# Patient Record
Sex: Male | Born: 1965 | Race: White | Hispanic: No | Marital: Married | Smoking: Current every day smoker
Health system: Southern US, Community
[De-identification: ages and names within clinical notes are randomized; demographics above are authoritative.]

## PROBLEM LIST (undated history)

## (undated) DIAGNOSIS — I10 Essential (primary) hypertension: Secondary | ICD-10-CM

## (undated) DIAGNOSIS — D699 Hemorrhagic condition, unspecified: Secondary | ICD-10-CM

## (undated) DIAGNOSIS — R51 Headache: Secondary | ICD-10-CM

## (undated) DIAGNOSIS — M549 Dorsalgia, unspecified: Secondary | ICD-10-CM

## (undated) DIAGNOSIS — F32A Depression, unspecified: Secondary | ICD-10-CM

## (undated) DIAGNOSIS — F1910 Other psychoactive substance abuse, uncomplicated: Secondary | ICD-10-CM

## (undated) DIAGNOSIS — Z720 Tobacco use: Secondary | ICD-10-CM

## (undated) DIAGNOSIS — I219 Acute myocardial infarction, unspecified: Secondary | ICD-10-CM

## (undated) DIAGNOSIS — I2510 Atherosclerotic heart disease of native coronary artery without angina pectoris: Secondary | ICD-10-CM

## (undated) DIAGNOSIS — F418 Other specified anxiety disorders: Secondary | ICD-10-CM

## (undated) DIAGNOSIS — B1920 Unspecified viral hepatitis C without hepatic coma: Secondary | ICD-10-CM

## (undated) DIAGNOSIS — E785 Hyperlipidemia, unspecified: Secondary | ICD-10-CM

## (undated) DIAGNOSIS — M1990 Unspecified osteoarthritis, unspecified site: Secondary | ICD-10-CM

## (undated) DIAGNOSIS — J449 Chronic obstructive pulmonary disease, unspecified: Secondary | ICD-10-CM

## (undated) DIAGNOSIS — J189 Pneumonia, unspecified organism: Secondary | ICD-10-CM

## (undated) DIAGNOSIS — K219 Gastro-esophageal reflux disease without esophagitis: Secondary | ICD-10-CM

## (undated) DIAGNOSIS — B1910 Unspecified viral hepatitis B without hepatic coma: Secondary | ICD-10-CM

## (undated) DIAGNOSIS — G8929 Other chronic pain: Secondary | ICD-10-CM

## (undated) DIAGNOSIS — R519 Headache, unspecified: Secondary | ICD-10-CM

## (undated) DIAGNOSIS — F329 Major depressive disorder, single episode, unspecified: Secondary | ICD-10-CM

## (undated) HISTORY — PX: UPPER GI ENDOSCOPY: SHX6162

---

## 2002-08-23 ENCOUNTER — Emergency Department (HOSPITAL_COMMUNITY): Admission: EM | Admit: 2002-08-23 | Discharge: 2002-08-24 | Payer: Self-pay | Admitting: *Deleted

## 2008-03-06 HISTORY — PX: SHOULDER ARTHROSCOPY: SHX128

## 2010-03-06 HISTORY — PX: SHOULDER SURGERY: SHX246

## 2010-03-27 ENCOUNTER — Encounter: Payer: Self-pay | Admitting: Family Medicine

## 2011-03-07 HISTORY — PX: FOREIGN BODY REMOVAL: SHX962

## 2017-07-19 DIAGNOSIS — R4182 Altered mental status, unspecified: Secondary | ICD-10-CM

## 2017-07-19 DIAGNOSIS — T50901A Poisoning by unspecified drugs, medicaments and biological substances, accidental (unintentional), initial encounter: Secondary | ICD-10-CM | POA: Diagnosis not present

## 2017-07-19 DIAGNOSIS — N289 Disorder of kidney and ureter, unspecified: Secondary | ICD-10-CM | POA: Diagnosis not present

## 2017-07-19 DIAGNOSIS — M86112 Other acute osteomyelitis, left shoulder: Secondary | ICD-10-CM

## 2017-07-20 DIAGNOSIS — R4182 Altered mental status, unspecified: Secondary | ICD-10-CM | POA: Diagnosis not present

## 2017-07-20 DIAGNOSIS — T50901A Poisoning by unspecified drugs, medicaments and biological substances, accidental (unintentional), initial encounter: Secondary | ICD-10-CM | POA: Diagnosis not present

## 2017-07-20 DIAGNOSIS — M86112 Other acute osteomyelitis, left shoulder: Secondary | ICD-10-CM | POA: Diagnosis not present

## 2017-07-20 DIAGNOSIS — N289 Disorder of kidney and ureter, unspecified: Secondary | ICD-10-CM | POA: Diagnosis not present

## 2017-07-21 DIAGNOSIS — R4182 Altered mental status, unspecified: Secondary | ICD-10-CM | POA: Diagnosis not present

## 2017-07-21 DIAGNOSIS — N289 Disorder of kidney and ureter, unspecified: Secondary | ICD-10-CM | POA: Diagnosis not present

## 2017-07-21 DIAGNOSIS — T50901A Poisoning by unspecified drugs, medicaments and biological substances, accidental (unintentional), initial encounter: Secondary | ICD-10-CM | POA: Diagnosis not present

## 2017-07-21 DIAGNOSIS — M86112 Other acute osteomyelitis, left shoulder: Secondary | ICD-10-CM | POA: Diagnosis not present

## 2017-07-22 ENCOUNTER — Inpatient Hospital Stay (HOSPITAL_COMMUNITY)
Admission: AD | Admit: 2017-07-22 | Discharge: 2017-08-19 | DRG: 500 | Disposition: A | Discharge status: Home / Self Care | Payer: Medicare Other | Source: Other Acute Inpatient Hospital | Attending: Internal Medicine | Admitting: Internal Medicine

## 2017-07-22 ENCOUNTER — Encounter (HOSPITAL_COMMUNITY): Payer: Self-pay | Admitting: Internal Medicine

## 2017-07-22 DIAGNOSIS — Z7151 Drug abuse counseling and surveillance of drug abuser: Secondary | ICD-10-CM

## 2017-07-22 DIAGNOSIS — B182 Chronic viral hepatitis C: Secondary | ICD-10-CM | POA: Diagnosis not present

## 2017-07-22 DIAGNOSIS — K8020 Calculus of gallbladder without cholecystitis without obstruction: Secondary | ICD-10-CM | POA: Diagnosis not present

## 2017-07-22 DIAGNOSIS — T375X5A Adverse effect of antiviral drugs, initial encounter: Secondary | ICD-10-CM | POA: Diagnosis not present

## 2017-07-22 DIAGNOSIS — N170 Acute kidney failure with tubular necrosis: Secondary | ICD-10-CM | POA: Diagnosis not present

## 2017-07-22 DIAGNOSIS — F418 Other specified anxiety disorders: Secondary | ICD-10-CM

## 2017-07-22 DIAGNOSIS — F1390 Sedative, hypnotic, or anxiolytic use, unspecified, uncomplicated: Secondary | ICD-10-CM | POA: Diagnosis not present

## 2017-07-22 DIAGNOSIS — E785 Hyperlipidemia, unspecified: Secondary | ICD-10-CM

## 2017-07-22 DIAGNOSIS — K429 Umbilical hernia without obstruction or gangrene: Secondary | ICD-10-CM | POA: Diagnosis not present

## 2017-07-22 DIAGNOSIS — R7881 Bacteremia: Secondary | ICD-10-CM | POA: Diagnosis present

## 2017-07-22 DIAGNOSIS — F1910 Other psychoactive substance abuse, uncomplicated: Secondary | ICD-10-CM | POA: Diagnosis not present

## 2017-07-22 DIAGNOSIS — Z9114 Patient's other noncompliance with medication regimen: Secondary | ICD-10-CM

## 2017-07-22 DIAGNOSIS — D61818 Other pancytopenia: Secondary | ICD-10-CM | POA: Diagnosis present

## 2017-07-22 DIAGNOSIS — J449 Chronic obstructive pulmonary disease, unspecified: Secondary | ICD-10-CM | POA: Diagnosis present

## 2017-07-22 DIAGNOSIS — E611 Iron deficiency: Secondary | ICD-10-CM | POA: Diagnosis present

## 2017-07-22 DIAGNOSIS — N200 Calculus of kidney: Secondary | ICD-10-CM | POA: Diagnosis not present

## 2017-07-22 DIAGNOSIS — R768 Other specified abnormal immunological findings in serum: Secondary | ICD-10-CM | POA: Diagnosis not present

## 2017-07-22 DIAGNOSIS — Z6379 Other stressful life events affecting family and household: Secondary | ICD-10-CM | POA: Diagnosis not present

## 2017-07-22 DIAGNOSIS — Z885 Allergy status to narcotic agent status: Secondary | ICD-10-CM

## 2017-07-22 DIAGNOSIS — F1320 Sedative, hypnotic or anxiolytic dependence, uncomplicated: Secondary | ICD-10-CM | POA: Diagnosis present

## 2017-07-22 DIAGNOSIS — Z886 Allergy status to analgesic agent status: Secondary | ICD-10-CM

## 2017-07-22 DIAGNOSIS — R945 Abnormal results of liver function studies: Secondary | ICD-10-CM

## 2017-07-22 DIAGNOSIS — M868X1 Other osteomyelitis, shoulder: Secondary | ICD-10-CM | POA: Diagnosis present

## 2017-07-22 DIAGNOSIS — F329 Major depressive disorder, single episode, unspecified: Secondary | ICD-10-CM | POA: Diagnosis not present

## 2017-07-22 DIAGNOSIS — R7989 Other specified abnormal findings of blood chemistry: Secondary | ICD-10-CM | POA: Diagnosis present

## 2017-07-22 DIAGNOSIS — G9341 Metabolic encephalopathy: Secondary | ICD-10-CM | POA: Diagnosis not present

## 2017-07-22 DIAGNOSIS — K7200 Acute and subacute hepatic failure without coma: Secondary | ICD-10-CM | POA: Diagnosis not present

## 2017-07-22 DIAGNOSIS — R45850 Homicidal ideations: Secondary | ICD-10-CM | POA: Diagnosis present

## 2017-07-22 DIAGNOSIS — I2583 Coronary atherosclerosis due to lipid rich plaque: Secondary | ICD-10-CM | POA: Diagnosis not present

## 2017-07-22 DIAGNOSIS — M86112 Other acute osteomyelitis, left shoulder: Secondary | ICD-10-CM | POA: Diagnosis not present

## 2017-07-22 DIAGNOSIS — I10 Essential (primary) hypertension: Secondary | ICD-10-CM | POA: Diagnosis not present

## 2017-07-22 DIAGNOSIS — T50901A Poisoning by unspecified drugs, medicaments and biological substances, accidental (unintentional), initial encounter: Secondary | ICD-10-CM | POA: Diagnosis not present

## 2017-07-22 DIAGNOSIS — E872 Acidosis: Secondary | ICD-10-CM | POA: Diagnosis present

## 2017-07-22 DIAGNOSIS — Y636 Underdosing and nonadministration of necessary drug, medicament or biological substance: Secondary | ICD-10-CM | POA: Diagnosis not present

## 2017-07-22 DIAGNOSIS — M869 Osteomyelitis, unspecified: Secondary | ICD-10-CM | POA: Diagnosis not present

## 2017-07-22 DIAGNOSIS — M00012 Staphylococcal arthritis, left shoulder: Secondary | ICD-10-CM | POA: Diagnosis not present

## 2017-07-22 DIAGNOSIS — Z720 Tobacco use: Secondary | ICD-10-CM | POA: Diagnosis not present

## 2017-07-22 DIAGNOSIS — Z8349 Family history of other endocrine, nutritional and metabolic diseases: Secondary | ICD-10-CM

## 2017-07-22 DIAGNOSIS — A419 Sepsis, unspecified organism: Secondary | ICD-10-CM | POA: Diagnosis not present

## 2017-07-22 DIAGNOSIS — R0789 Other chest pain: Secondary | ICD-10-CM | POA: Diagnosis not present

## 2017-07-22 DIAGNOSIS — K766 Portal hypertension: Secondary | ICD-10-CM | POA: Diagnosis present

## 2017-07-22 DIAGNOSIS — M609 Myositis, unspecified: Secondary | ICD-10-CM | POA: Diagnosis not present

## 2017-07-22 DIAGNOSIS — B9562 Methicillin resistant Staphylococcus aureus infection as the cause of diseases classified elsewhere: Secondary | ICD-10-CM

## 2017-07-22 DIAGNOSIS — K219 Gastro-esophageal reflux disease without esophagitis: Secondary | ICD-10-CM | POA: Diagnosis present

## 2017-07-22 DIAGNOSIS — N183 Chronic kidney disease, stage 3 (moderate): Secondary | ICD-10-CM | POA: Diagnosis present

## 2017-07-22 DIAGNOSIS — K7290 Hepatic failure, unspecified without coma: Secondary | ICD-10-CM | POA: Diagnosis not present

## 2017-07-22 DIAGNOSIS — M009 Pyogenic arthritis, unspecified: Secondary | ICD-10-CM | POA: Diagnosis present

## 2017-07-22 DIAGNOSIS — B181 Chronic viral hepatitis B without delta-agent: Secondary | ICD-10-CM | POA: Diagnosis present

## 2017-07-22 DIAGNOSIS — I361 Nonrheumatic tricuspid (valve) insufficiency: Secondary | ICD-10-CM | POA: Diagnosis not present

## 2017-07-22 DIAGNOSIS — N189 Chronic kidney disease, unspecified: Secondary | ICD-10-CM | POA: Diagnosis not present

## 2017-07-22 DIAGNOSIS — Z811 Family history of alcohol abuse and dependence: Secondary | ICD-10-CM | POA: Diagnosis not present

## 2017-07-22 DIAGNOSIS — F1590 Other stimulant use, unspecified, uncomplicated: Secondary | ICD-10-CM | POA: Diagnosis not present

## 2017-07-22 DIAGNOSIS — D539 Nutritional anemia, unspecified: Secondary | ICD-10-CM | POA: Diagnosis present

## 2017-07-22 DIAGNOSIS — F17210 Nicotine dependence, cigarettes, uncomplicated: Secondary | ICD-10-CM | POA: Diagnosis present

## 2017-07-22 DIAGNOSIS — I358 Other nonrheumatic aortic valve disorders: Secondary | ICD-10-CM | POA: Diagnosis present

## 2017-07-22 DIAGNOSIS — B180 Chronic viral hepatitis B with delta-agent: Secondary | ICD-10-CM | POA: Diagnosis not present

## 2017-07-22 DIAGNOSIS — I129 Hypertensive chronic kidney disease with stage 1 through stage 4 chronic kidney disease, or unspecified chronic kidney disease: Secondary | ICD-10-CM | POA: Diagnosis present

## 2017-07-22 DIAGNOSIS — K7460 Unspecified cirrhosis of liver: Secondary | ICD-10-CM | POA: Diagnosis present

## 2017-07-22 DIAGNOSIS — N179 Acute kidney failure, unspecified: Secondary | ICD-10-CM | POA: Diagnosis not present

## 2017-07-22 DIAGNOSIS — M25512 Pain in left shoulder: Secondary | ICD-10-CM

## 2017-07-22 DIAGNOSIS — R4182 Altered mental status, unspecified: Secondary | ICD-10-CM | POA: Diagnosis not present

## 2017-07-22 DIAGNOSIS — D638 Anemia in other chronic diseases classified elsewhere: Secondary | ICD-10-CM | POA: Diagnosis present

## 2017-07-22 DIAGNOSIS — K7030 Alcoholic cirrhosis of liver without ascites: Secondary | ICD-10-CM | POA: Diagnosis not present

## 2017-07-22 DIAGNOSIS — I2510 Atherosclerotic heart disease of native coronary artery without angina pectoris: Secondary | ICD-10-CM | POA: Diagnosis present

## 2017-07-22 DIAGNOSIS — R450 Nervousness: Secondary | ICD-10-CM | POA: Diagnosis not present

## 2017-07-22 DIAGNOSIS — N289 Disorder of kidney and ureter, unspecified: Secondary | ICD-10-CM | POA: Diagnosis not present

## 2017-07-22 DIAGNOSIS — Z635 Disruption of family by separation and divorce: Secondary | ICD-10-CM | POA: Diagnosis not present

## 2017-07-22 DIAGNOSIS — F419 Anxiety disorder, unspecified: Secondary | ICD-10-CM | POA: Diagnosis not present

## 2017-07-22 DIAGNOSIS — A4902 Methicillin resistant Staphylococcus aureus infection, unspecified site: Secondary | ICD-10-CM | POA: Diagnosis present

## 2017-07-22 DIAGNOSIS — M25519 Pain in unspecified shoulder: Secondary | ICD-10-CM

## 2017-07-22 DIAGNOSIS — E876 Hypokalemia: Secondary | ICD-10-CM | POA: Diagnosis not present

## 2017-07-22 HISTORY — DX: Hyperlipidemia, unspecified: E78.5

## 2017-07-22 HISTORY — DX: Tobacco use: Z72.0

## 2017-07-22 HISTORY — DX: Headache, unspecified: R51.9

## 2017-07-22 HISTORY — DX: Other chronic pain: G89.29

## 2017-07-22 HISTORY — DX: Hemorrhagic condition, unspecified: D69.9

## 2017-07-22 HISTORY — DX: Essential (primary) hypertension: I10

## 2017-07-22 HISTORY — DX: Pneumonia, unspecified organism: J18.9

## 2017-07-22 HISTORY — DX: Unspecified osteoarthritis, unspecified site: M19.90

## 2017-07-22 HISTORY — DX: Depression, unspecified: F32.A

## 2017-07-22 HISTORY — DX: Unspecified viral hepatitis B without hepatic coma: B19.10

## 2017-07-22 HISTORY — DX: Other psychoactive substance abuse, uncomplicated: F19.10

## 2017-07-22 HISTORY — DX: Other specified anxiety disorders: F41.8

## 2017-07-22 HISTORY — DX: Atherosclerotic heart disease of native coronary artery without angina pectoris: I25.10

## 2017-07-22 HISTORY — DX: Major depressive disorder, single episode, unspecified: F32.9

## 2017-07-22 HISTORY — DX: Unspecified viral hepatitis C without hepatic coma: B19.20

## 2017-07-22 HISTORY — DX: Gastro-esophageal reflux disease without esophagitis: K21.9

## 2017-07-22 HISTORY — DX: Chronic obstructive pulmonary disease, unspecified: J44.9

## 2017-07-22 HISTORY — DX: Acute myocardial infarction, unspecified: I21.9

## 2017-07-22 HISTORY — DX: Dorsalgia, unspecified: M54.9

## 2017-07-22 HISTORY — DX: Headache: R51

## 2017-07-22 NOTE — H&P (Addendum)
History and Physical    Glenn Alvarado WPY:[id number] DOB: [birth year] DOA: 07/22/2017  Referring MD/NP/PA:   PCP: Patient, No Pcp Per   Patient coming from:  The patient is coming from home.  At baseline, pt is independent for most of ADL.   Chief Complaint: left shoulder pain  HPI: Glenn Alvarado is a 52 y.o. male with medical history significant of hypertension, hyperlipidemia COPD, GERD, depression with anxiety, CAD, substance abuse, tobacco abuse, who presents with left shoulder pain.  Patient is transfered from [facility].  Pt was initially admitted to [facility] on 07/19/2017 due to altered mental status.  That was most likely due to drug abuse.  Patient had negative CT head.  And found to have possibly UDS for amphetamine and benzodiazepines.  Patient's altered mental status improved quickly.  After he woke up, patient complains of left shoulder pain.  He also had fver and leukocytosis. CT scan finding is concerning for osteomyelitis of the glenohumeral joint. Orthopedics was consulted there, recommending transfer to tertiary care center with ID, plastics as well as trauma available for wound washout.   Lab tests showed leukocytosis, elevated creatinine, anion gap metabolic acidosis, elevated T bili AST, worsening anemia from baseline. Patient also had 1/4 blood cultures positive for MRSA admission. Pt had MRI of left shoulder done, but no report was sent here with pt.  When I saw pt on the floor, he has severe pain left shoulder.  The pain is constant, severe, 10 out of 10 severity, radiating to left side of chest.  Patient has significant limitation of range of motion of left shoulder.  He also has left shoulder effusion and warmth.  Patient denies cough, nausea, vomiting, diarrhea, abdominal pain, symptoms of UTI.  No unilateral weakness.  Currently no fever or chills.  Patient states that she is not taking any prescribed medications at home.   Echocardiogram from [facility]  hospital: 1. There is normal global left ventricular contractility.  2. Overall left ventricular systolic function is normal with, an EF between 65 - 70 %.  3. The right ventricular systolic function is normal.  4. There is mild aortic valve sclerosis.  5. Mild tricuspid regurgitation present.   Review of Systems:   General: currently no fevers, chills, no body weight gain, has fatigue HEENT: no blurry vision, hearing changes or sore throat Respiratory: no dyspnea, coughing, wheezing CV: no chest pain, no palpitations GI: no nausea, vomiting, abdominal pain, diarrhea, constipation GU: no dysuria, burning on urination, increased urinary frequency, hematuria  Ext: no leg edema Neuro: no unilateral weakness, numbness, or tingling, no vision change or hearing loss Skin: no rash, no skin tear. MSK: has left shoulder pain, swelling and limitation of range of motion. Heme: No easy bruising.  Travel history: No recent long distant travel.  Allergy: Allergies not on file  Past Medical History:  Diagnosis Date  . CAD (coronary artery disease)   . Depression with anxiety   . Essential hypertension   . GERD (gastroesophageal reflux disease)   . HLD (hyperlipidemia)   . Substance abuse ([place])   . Tobacco abuse     Past Surgical History:  Procedure Laterality Date  . SHOULDER SURGERY Right     Social History:  reports that he has been smoking.  He has never used smokeless tobacco. He reports that he drank alcohol. He reports that he has current or past drug history. Drug: Amphetamines.  Family History:  Family History  Problem Relation Age of Onset  .  Hyperlipidemia Father   . Alcoholism Father      Prior to Admission medications   Not on File    Physical Exam: Vitals:   07/23/17 0118 07/23/17 0200 07/23/17 0426  BP: (!) 143/79  131/78  Pulse: 63  70  Resp: 20  20  Temp: 98.5 F (36.9 C)  98.7 F (37.1 C)  TempSrc: Oral  Oral  SpO2: 100%  100%  Weight:  77.3 kg (170  lb 6.7 oz)   Height:  5' 8" (1.727 m)    General: Not in acute distress HEENT:       Eyes: PERRL, EOMI, no scleral icterus.       ENT: No discharge from the ears and nose, no pharynx injection, no tonsillar enlargement.        Neck: No JVD, no bruit, no mass felt. Heme: No neck lymph node enlargement. Cardiac: S1/S2, RRR, No murmurs, No gallops or rubs. Respiratory: No rales, wheezing, rhonchi or rubs. GI: Soft, nondistended, nontender, no rebound pain, no organomegaly, BS present. GU: No hematuria Ext: No pitting leg edema bilaterally. 2+DP/PT pulse bilaterally. Musculoskeletal: has left shoulder pain, swelling, warmth, and limitation of range of motion. Skin: No rashes.  Neuro: Alert, oriented X3, cranial nerves II-XII grossly intact, moves all extremities. Psych: Patient is not psychotic, no suicidal or hemocidal ideation.  Labs on Admission: I have personally reviewed following labs and imaging studies  CBC: Recent Labs  Lab 07/23/17 0419  WBC 3.7*  NEUTROABS 2.2  HGB 7.9*  HCT 24.3*  MCV 98.0  PLT 629*   Basic Metabolic Panel: Recent Labs  Lab 07/23/17 0419  NA 138  K 4.1  CL 113*  CO2 16*  GLUCOSE 104*  BUN 31*  CREATININE 2.88*  CALCIUM 7.8*   GFR: Estimated Creatinine Clearance: 29.7 mL/min (A) (by C-G formula based on SCr of 2.88 mg/dL (H)). Liver Function Tests: Recent Labs  Lab 07/23/17 0419  AST 87*  ALT 43  ALKPHOS 80  BILITOT 0.9  PROT 7.7  ALBUMIN 2.2*   No results for input(s): LIPASE, AMYLASE in the last 168 hours. No results for input(s): AMMONIA in the last 168 hours. Coagulation Profile: Recent Labs  Lab 07/23/17 0419  INR 1.45   Cardiac Enzymes: No results for input(s): CKTOTAL, CKMB, CKMBINDEX, TROPONINI in the last 168 hours. BNP (last 3 results) No results for input(s): PROBNP in the last 8760 hours. HbA1C: No results for input(s): HGBA1C in the last 72 hours. CBG: No results for input(s): GLUCAP in the last 168  hours. Lipid Profile: Recent Labs    07/23/17 0419  CHOL 95  HDL 17*  LDLCALC 65  TRIG 64  CHOLHDL 5.6   Thyroid Function Tests: No results for input(s): TSH, T4TOTAL, FREET4, T3FREE, THYROIDAB in the last 72 hours. Anemia Panel: No results for input(s): VITAMINB12, FOLATE, FERRITIN, TIBC, IRON, RETICCTPCT in the last 72 hours. Urine analysis: No results found for: COLORURINE, APPEARANCEUR, LABSPEC, PHURINE, GLUCOSEU, HGBUR, BILIRUBINUR, KETONESUR, PROTEINUR, UROBILINOGEN, NITRITE, LEUKOCYTESUR Sepsis Labs: _0 (procalcitonin:4,lacticidven:4) )No results found for this or any previous visit (from the past 240 hour(s)).   Radiological Exams on Admission: No results found.   EKG: Independently reviewed.  Not done in ED, will get one.   Assessment/Plan Principal Problem:   Osteomyelitis of left shoulder ([name]) Active Problems:   CAD (coronary artery disease)   Depression with anxiety   Essential hypertension   GERD (gastroesophageal reflux disease)   HLD (hyperlipidemia)   Substance abuse ([place])  AKI (acute kidney injury) ([place])   Pancytopenia ([place])   Acute metabolic encephalopathy   Septic joint of left shoulder region ([name])   Abnormal LFTs   Tobacco abuse   COPD (chronic obstructive pulmonary disease) ([name])   Principal Problem:   Osteomyelitis of left shoulder ([name]) Active Problems:   CAD (coronary artery disease)   Depression with anxiety   Essential hypertension   GERD (gastroesophageal reflux disease)   HLD (hyperlipidemia)   Substance abuse ([place])   AKI (acute kidney injury) ([place])   Pancytopenia ([place])   Acute metabolic encephalopathy   Septic joint of left shoulder region ([name])   Abnormal LFTs   Tobacco abuse   COPD (chronic obstructive pulmonary disease) ([name])    Assessment and plan:   Left shoulder pain, imaging concerning for osteomyelitis of the glenohumeral joint: at arrival, patient does not have fever or chills.  His WBC was 3.7 today.  Ortho, Dr. Percell Miller was consulted. Pt had MRI in the hospital, will need to request report in the morning. Patient had 1/4 blood cultures positive for MRSA admission.  - will admit to med-surg bed  - Empiric antimicrobial treatment with vancomycin and Zosyn per pharmacy - PRN Zofran for nausea, oxycodone for pain - Blood cultures x 2  - ESR and CRP - will get Procalcitonin and trend lactic acid levels per sepsis protocol. - IVF: 125 cc/h - CBC and CMP -Patient develops fever, will be difficult to choose medications.- patient has acute renal injury, cannot use ibuprofen; his abnormal liver function making Tylenol not good candidate.  May carefully use Tylenol 325 mg prn  Acute metabolic encephalopathy: resolved. CT head negative. Now  mental status normal.  Most likely due to substance abuse -no new issue  AKI: Most recent labs in March show baseline creatinine 0.9. His Cre 3.3--> down to 2.7--> but up trending to 2.9 today. CT scan showed bilateral nonobstructive nephrolithiasis. No hydronephrosis or renal obstruction is noted.  -continue IVF, NS 125cc/h -f/u by BMP   Abnormal liver function: ALP 116, AST was 67, ALT 60, total bilirubin 0.5, concern for recent alcohol use/abuse; questionable history of hepatitis. Alcohol negative per UDS, patient denies any recent alcohol use  -Hepatitis panel, HIV antibody  Nephrolithiasis, incidentally found  -continue IV hydration as above, nonobstructive  -follow for passage/pain, if necessary will consult Urology/follow up outpatient   Pancytopenia: Hemoglobin 8.1, WBC 3.7, platelets 90 today. -anemia penal  CAD (coronary artery disease): no CP. Not taking any meds -check FLP and A1c  Depression with anxiety: not taking meds. No SI or HI now -prn Xanax  Essential hypertension: -IV hydralazine during  GERD: -Protonix prn  HLD (hyperlipidemia): not taking meds -check FLP  Substance abuse ([name]) and tobacco abuse -did counseling about the  importance of quitting substance use -nicotine patch  COPD: stable -PRN albuterol nebulizers and Mucinex    DVT ppx: SCd Code Status: Full code Family Communication: None at bed side.  Disposition Plan:  Anticipate discharge back to previous home environment Consults called:  Dr. Percell Miller Admission status:  medical floor/inpt   Date of Service 07/23/2017    Ivor Costa [facility] Pager [phone]  If 7PM-7AM, please contact night-coverage [email or web address] Password [email or web address] 07/23/2017, 6:10 AM

## 2017-07-22 NOTE — Progress Notes (Signed)
Text paged [facility] to notify MD of patient's arrival to floor.

## 2017-07-23 ENCOUNTER — Encounter (HOSPITAL_COMMUNITY): Payer: Self-pay | Admitting: Internal Medicine

## 2017-07-23 ENCOUNTER — Inpatient Hospital Stay (HOSPITAL_COMMUNITY): Payer: Medicare Other

## 2017-07-23 DIAGNOSIS — Z720 Tobacco use: Secondary | ICD-10-CM | POA: Diagnosis present

## 2017-07-23 DIAGNOSIS — B180 Chronic viral hepatitis B with delta-agent: Secondary | ICD-10-CM

## 2017-07-23 DIAGNOSIS — J449 Chronic obstructive pulmonary disease, unspecified: Secondary | ICD-10-CM | POA: Diagnosis present

## 2017-07-23 LAB — CBC WITH DIFFERENTIAL/PLATELET
ABS IMMATURE GRANULOCYTES: 0 10*3/uL (ref 0.0–0.1)
BASOS ABS: 0 10*3/uL (ref 0.0–0.1)
Basophils Relative: 1 %
EOS PCT: 6 %
Eosinophils Absolute: 0.2 10*3/uL (ref 0.0–0.7)
HEMATOCRIT: 24.3 % — AB (ref 39.0–52.0)
HEMOGLOBIN: 7.9 g/dL — AB (ref 13.0–17.0)
Immature Granulocytes: 0 %
LYMPHS ABS: 0.9 10*3/uL (ref 0.7–4.0)
LYMPHS PCT: 24 %
MCH: 31.9 pg (ref 26.0–34.0)
MCHC: 32.5 g/dL (ref 30.0–36.0)
MCV: 98 fL (ref 78.0–100.0)
MONO ABS: 0.4 10*3/uL (ref 0.1–1.0)
Monocytes Relative: 11 %
NEUTROS ABS: 2.2 10*3/uL (ref 1.7–7.7)
Neutrophils Relative %: 58 %
Platelets: 100 10*3/uL — ABNORMAL LOW (ref 150–400)
RBC: 2.48 MIL/uL — AB (ref 4.22–5.81)
RDW: 15.6 % — ABNORMAL HIGH (ref 11.5–15.5)
WBC: 3.7 10*3/uL — ABNORMAL LOW (ref 4.0–10.5)

## 2017-07-23 LAB — COMPREHENSIVE METABOLIC PANEL
ALBUMIN: 2.2 g/dL — AB (ref 3.5–5.0)
ALK PHOS: 80 U/L (ref 38–126)
ALT: 43 U/L (ref 17–63)
AST: 87 U/L — ABNORMAL HIGH (ref 15–41)
Anion gap: 9 (ref 5–15)
BILIRUBIN TOTAL: 0.9 mg/dL (ref 0.3–1.2)
BUN: 31 mg/dL — AB (ref 6–20)
CALCIUM: 7.8 mg/dL — AB (ref 8.9–10.3)
CO2: 16 mmol/L — AB (ref 22–32)
CREATININE: 2.88 mg/dL — AB (ref 0.61–1.24)
Chloride: 113 mmol/L — ABNORMAL HIGH (ref 101–111)
GFR calc Af Amer: 28 mL/min — ABNORMAL LOW (ref >60)
GFR calc non Af Amer: 24 mL/min — ABNORMAL LOW (ref >60)
GLUCOSE: 104 mg/dL — AB (ref 65–99)
Potassium: 4.1 mmol/L (ref 3.5–5.1)
SODIUM: 138 mmol/L (ref 135–145)
TOTAL PROTEIN: 7.7 g/dL (ref 6.5–8.1)

## 2017-07-23 LAB — HEMOGLOBIN A1C
Hgb A1c MFr Bld: 4.4 % — ABNORMAL LOW (ref 4.8–5.6)
Mean Plasma Glucose: 79.58 mg/dL

## 2017-07-23 LAB — SEDIMENTATION RATE: Sed Rate: 99 mm/hr — ABNORMAL HIGH (ref 0–16)

## 2017-07-23 LAB — TYPE AND SCREEN
ABO/RH(D): A POS
ANTIBODY SCREEN: NEGATIVE

## 2017-07-23 LAB — VITAMIN B12: Vitamin B-12: 938 pg/mL — ABNORMAL HIGH (ref 180–914)

## 2017-07-23 LAB — APTT: aPTT: 45 seconds — ABNORMAL HIGH (ref 24–36)

## 2017-07-23 LAB — GLUCOSE, CAPILLARY: Glucose-Capillary: 88 mg/dL (ref 65–99)

## 2017-07-23 LAB — LIPID PANEL
CHOLESTEROL: 95 mg/dL (ref 0–200)
HDL: 17 mg/dL — AB (ref >40)
LDL Cholesterol: 65 mg/dL (ref 0–99)
TRIGLYCERIDES: 64 mg/dL (ref <150)
Total CHOL/HDL Ratio: 5.6 RATIO
VLDL: 13 mg/dL (ref 0–40)

## 2017-07-23 LAB — HIV ANTIBODY (ROUTINE TESTING W REFLEX): HIV Screen 4th Generation wRfx: NONREACTIVE

## 2017-07-23 LAB — RETICULOCYTES
RBC.: 2.4 MIL/uL — ABNORMAL LOW (ref 4.22–5.81)
RETIC COUNT ABSOLUTE: 31.2 10*3/uL (ref 19.0–186.0)
Retic Ct Pct: 1.3 % (ref 0.4–3.1)

## 2017-07-23 LAB — SYNOVIAL FLUID, CRYSTAL: Crystals, Fluid: NONE SEEN

## 2017-07-23 LAB — ABO/RH: ABO/RH(D): A POS

## 2017-07-23 LAB — IRON AND TIBC
IRON: 34 ug/dL — AB (ref 45–182)
Saturation Ratios: 11 % — ABNORMAL LOW (ref 17.9–39.5)
TIBC: 316 ug/dL (ref 250–450)
UIBC: 282 ug/dL

## 2017-07-23 LAB — PROTIME-INR
INR: 1.45
PROTHROMBIN TIME: 17.5 s — AB (ref 11.4–15.2)

## 2017-07-23 LAB — C-REACTIVE PROTEIN: CRP: 1.6 mg/dL — ABNORMAL HIGH (ref <1.0)

## 2017-07-23 LAB — FOLATE: Folate: 22.2 ng/mL (ref >5.9)

## 2017-07-23 LAB — FERRITIN: FERRITIN: 82 ng/mL (ref 24–336)

## 2017-07-23 MED ORDER — SODIUM CHLORIDE 0.9 % IJ SOLN: 10.0000 mL | Freq: Once | INTRAMUSCULAR | Status: DC

## 2017-07-23 MED ORDER — ONDANSETRON HCL 4 MG/2ML IJ SOLN
4.0000 mg | Freq: Three times a day (TID) | INTRAMUSCULAR | Status: DC | PRN
  Administered 2017-07-26 (×2): 4 mg via INTRAVENOUS
  Filled 2017-07-23 (×2): qty 2

## 2017-07-23 MED ORDER — MORPHINE SULFATE (PF) 4 MG/ML IV SOLN
4.0000 mg | INTRAVENOUS | Status: AC | PRN
  Administered 2017-07-23 (×2): 4 mg via INTRAVENOUS
  Filled 2017-07-23 (×2): qty 1

## 2017-07-23 MED ORDER — POLYETHYLENE GLYCOL 3350 17 G PO PACK: 17.0000 g | PACK | Freq: Every day | ORAL | Status: DC | PRN

## 2017-07-23 MED ORDER — CHLORHEXIDINE GLUCONATE 0.12 % MT SOLN
15.0000 mL | Freq: Two times a day (BID) | OROMUCOSAL | Status: DC
  Administered 2017-07-23: 15 mL via OROMUCOSAL
  Filled 2017-07-23: qty 15

## 2017-07-23 MED ORDER — SODIUM CHLORIDE 0.9 % IV SOLN
INTRAVENOUS | Status: DC
  Administered 2017-07-23 – 2017-07-25 (×4): via INTRAVENOUS

## 2017-07-23 MED ORDER — PIPERACILLIN-TAZOBACTAM 3.375 G IVPB
3.3750 g | Freq: Three times a day (TID) | INTRAVENOUS | Status: DC
  Filled 2017-07-23 (×2): qty 50

## 2017-07-23 MED ORDER — ZOLPIDEM TARTRATE 5 MG PO TABS
5.0000 mg | ORAL_TABLET | Freq: Every evening | ORAL | Status: DC | PRN
  Administered 2017-07-23 – 2017-07-24 (×2): 5 mg via ORAL
  Filled 2017-07-23 (×2): qty 1

## 2017-07-23 MED ORDER — OXYCODONE HCL 5 MG PO TABS
5.0000 mg | ORAL_TABLET | ORAL | Status: DC | PRN
  Administered 2017-07-23 – 2017-07-29 (×28): 5 mg via ORAL
  Filled 2017-07-23 (×29): qty 1

## 2017-07-23 MED ORDER — ACETAMINOPHEN 325 MG PO TABS
325.0000 mg | ORAL_TABLET | Freq: Four times a day (QID) | ORAL | Status: DC | PRN
  Administered 2017-07-26 – 2017-08-15 (×4): 325 mg via ORAL
  Filled 2017-07-23 (×5): qty 1

## 2017-07-23 MED ORDER — LIDOCAINE HCL (PF) 1 % IJ SOLN
INTRAMUSCULAR | Status: AC
  Administered 2017-07-23: 4 mL via INTRADERMAL
  Filled 2017-07-23: qty 5

## 2017-07-23 MED ORDER — ALBUTEROL SULFATE (2.5 MG/3ML) 0.083% IN NEBU: 2.5000 mg | INHALATION_SOLUTION | RESPIRATORY_TRACT | Status: DC | PRN

## 2017-07-23 MED ORDER — ENSURE ENLIVE PO LIQD
237.0000 mL | Freq: Two times a day (BID) | ORAL | Status: DC
  Administered 2017-07-24 – 2017-08-18 (×46): 237 mL via ORAL

## 2017-07-23 MED ORDER — ALPRAZOLAM 0.5 MG PO TABS
0.5000 mg | ORAL_TABLET | Freq: Three times a day (TID) | ORAL | Status: DC | PRN
  Administered 2017-07-23 – 2017-07-25 (×7): 0.5 mg via ORAL
  Filled 2017-07-23 (×7): qty 1

## 2017-07-23 MED ORDER — NICOTINE 21 MG/24HR TD PT24
21.0000 mg | MEDICATED_PATCH | Freq: Every day | TRANSDERMAL | Status: DC
  Administered 2017-07-23 – 2017-08-19 (×27): 21 mg via TRANSDERMAL
  Filled 2017-07-23 (×27): qty 1

## 2017-07-23 MED ORDER — TENOFOVIR DISOPROXIL FUMARATE 300 MG PO TABS
300.0000 mg | ORAL_TABLET | ORAL | Status: DC
  Administered 2017-07-23 – 2017-07-29 (×3): 300 mg via ORAL
  Filled 2017-07-23 (×3): qty 1

## 2017-07-23 MED ORDER — SODIUM CHLORIDE 0.9 % IJ SOLN
INTRAMUSCULAR | Status: AC
  Filled 2017-07-23: qty 10

## 2017-07-23 MED ORDER — LIDOCAINE HCL (PF) 1 % IJ SOLN: 5.0000 mL | Freq: Once | INTRAMUSCULAR | Status: DC

## 2017-07-23 MED ORDER — PIPERACILLIN-TAZOBACTAM 3.375 G IVPB 30 MIN
3.3750 g | Freq: Once | INTRAVENOUS | Status: AC
  Administered 2017-07-23: 3.375 g via INTRAVENOUS
  Filled 2017-07-23 (×3): qty 50

## 2017-07-23 MED ORDER — HYDRALAZINE HCL 20 MG/ML IJ SOLN: 5.0000 mg | INTRAMUSCULAR | Status: DC | PRN

## 2017-07-23 MED ORDER — VANCOMYCIN HCL IN DEXTROSE 1-5 GM/200ML-% IV SOLN
1000.0000 mg | Freq: Once | INTRAVENOUS | Status: AC
  Administered 2017-07-23: 1000 mg via INTRAVENOUS
  Filled 2017-07-23 (×2): qty 200

## 2017-07-23 MED ORDER — ORAL CARE MOUTH RINSE
15.0000 mL | Freq: Two times a day (BID) | OROMUCOSAL | Status: DC
  Administered 2017-07-23 (×2): 15 mL via OROMUCOSAL

## 2017-07-23 MED ORDER — VANCOMYCIN HCL IN DEXTROSE 750-5 MG/150ML-% IV SOLN
750.0000 mg | INTRAVENOUS | Status: DC
  Administered 2017-07-24: 750 mg via INTRAVENOUS
  Filled 2017-07-23: qty 150

## 2017-07-23 MED ORDER — PANTOPRAZOLE SODIUM 40 MG PO TBEC: 40.0000 mg | DELAYED_RELEASE_TABLET | Freq: Every day | ORAL | Status: DC | PRN

## 2017-07-23 MED ORDER — LIDOCAINE HCL (PF) 1 % IJ SOLN
5.0000 mL | Freq: Once | INTRAMUSCULAR | Status: AC
  Administered 2017-07-23: 4 mL via INTRADERMAL

## 2017-07-23 MED ORDER — IOPAMIDOL (ISOVUE-M 200) INJECTION 41%
20.0000 mL | Freq: Once | INTRAMUSCULAR | Status: AC
  Administered 2017-07-23: 8 mL via INTRA_ARTICULAR

## 2017-07-23 MED ORDER — ACETAMINOPHEN 325 MG PO TABS: 650.0000 mg | ORAL_TABLET | Freq: Four times a day (QID) | ORAL | Status: DC | PRN

## 2017-07-23 MED ORDER — IOPAMIDOL (ISOVUE-M 200) INJECTION 41%
INTRAMUSCULAR | Status: AC
  Administered 2017-07-23: 8 mL via INTRA_ARTICULAR
  Filled 2017-07-23: qty 10

## 2017-07-23 MED ORDER — DM-GUAIFENESIN ER 30-600 MG PO TB12: 1.0000 | ORAL_TABLET | Freq: Two times a day (BID) | ORAL | Status: DC | PRN

## 2017-07-23 NOTE — Consult Note (Signed)
[facility] for Infectious Disease    Date of Admission:  07/22/2017   Total days of antibiotics 1         Day 1 Vancomycin               Reason for Consult: MRSA bacteremia, ?septic left shoulder     Referring Provider: Dr. Cruzita Lederer   Assessment: Glenn Alvarado is a 52 y.o. male with history of cirrhosis, Chronic [name], CAD, HTN, COPD, Depression/Anxiety, substance use admitted to [facility] with encephalopathy. Found to be bacteremic with 1/4 bottles (+) MRSA. TTE at [facility] was negative. CT scan of the shoulder concerning for osteomyelitis/septic arthritis of the glenoid humeral joint. Orthopedics has seen the patient and planning CT-guided aspiration. He has received antibiotics at [facility] and is now on Vancomycin since admission. CRP slightly elevated at 1.6 with ESR 99.   He is on entecavir at home 0.5 mg QD per chart review at [facility]. He has had a lapse filling his medications from his report. Will check [name] DNA with next lab draw. Our pharmacy does not carry this here nor the preferred Vemlidy. Will start Vired 300 mg Q72h - will follow renal function to adjust (>83m/min increase to Q48).   Plan: 1. Await CT guided aspirate of shoulder 2. Continue Vancomycin  3. Start Vired 300 mg Q72h and watch kidney function to adjust.  4. [name] DNA tomorrow morning 5. Repeat blood cultures pending 6. Watch kidney function on vancomycin - not sure where baseline is   Principal Problem:   Osteomyelitis of left shoulder ([name]) Active Problems:   CAD (coronary artery disease)   Depression with anxiety   Essential hypertension   GERD (gastroesophageal reflux disease)   HLD (hyperlipidemia)   Substance abuse ([name])   AKI (acute kidney injury) ([place]   Pancytopenia ([name])   Acute metabolic encephalopathy   Septic joint of left shoulder region ([name])   Abnormal LFTs   Tobacco abuse   COPD (chronic obstructive pulmonary disease) ([name])   Chronic viral hepatitis B  with cirrhosis ([place]   . iopamidol      . lidocaine (PF)      . sodium chloride      . chlorhexidine  15 mL Mouth Rinse BID  . feeding supplement (ENSURE ENLIVE)  237 mL Oral BID BM  . iopamidol  20 mL Intra-articular Once  . lidocaine (PF)  5 mL Intradermal Once  . mouth rinse  15 mL Mouth Rinse q12n4p  . nicotine  21 mg Transdermal Daily  . sodium chloride  10 mL Intravenous Once    HPI: Glenn Alvarado a 52y.o. male past medical history detailed below but significant for left shoulder pain and fevers. He has chronic pain in this joint however the last week or so he has had significant impact in function of the joint and now pain down to the left elbow. He can passively raise the arm with the right hand however cannot lift with the left independently past 45 degrees. He was having fevers/chills a few days prior to admission. Review of systems otherwise negative. He has a history of chronic opioid and xanax use and does not follow routinely with outpatient provider due to cost constraints recently. Denies IVDU. Did cut his left hand recently and "hugged someone with MRSA infection recently" and worried that is where he got it. No hardware in neck or other joint per his history.  He tells me he acquired [name] from his wife (whom uses IVD). He is on a little white pill daily and follows with [facility] team for this infection. Has been out of this for a "little while" now due to cost/insurance.   Review of Systems: Review of Systems  All other systems reviewed and are negative.   Past Medical History:  Diagnosis Date  . CAD (coronary artery disease)   . Depression with anxiety   . Essential hypertension   . GERD (gastroesophageal reflux disease)   . HLD (hyperlipidemia)   . Substance abuse ([place])   . Tobacco abuse     Social History   Tobacco Use  . Smoking status: Current Every Day Smoker  . Smokeless tobacco: Never Used  Substance Use Topics  . Alcohol use: Not Currently  . Drug  use: Yes    Types: Amphetamines    Family History  Problem Relation Age of Onset  . Hyperlipidemia Father   . Alcoholism Father    Allergies not on file  OBJECTIVE: Blood pressure 140/81, pulse 61, temperature (!) 97.3 F (36.3 C), temperature source Oral, resp. rate 20, height 5' 8"  (1.727 m), weight 170 lb 6.7 oz (77.3 kg), SpO2 100 %.  Physical Exam  Constitutional: He is oriented to person, place, and time.  HENT:  Mouth/Throat: No oral lesions. Normal dentition. No dental caries.  Eyes: No scleral icterus.  Cardiovascular: Normal rate, regular rhythm and normal heart sounds.  Pulmonary/Chest: Effort normal and breath sounds normal.  Abdominal: Soft. He exhibits no distension. There is no tenderness.  Musculoskeletal:  Unable to raise left arm above 45 deg with active ROM. Very shaky. Point tenderness in anterior shoulder.   Lymphadenopathy:    He has no cervical adenopathy.  Neurological: He is alert and oriented to person, place, and time.  Skin: Skin is warm and dry. No rash noted.    Lab Results Lab Results  Component Value Date   WBC 3.7 (L) 07/23/2017   HGB 7.9 (L) 07/23/2017   HCT 24.3 (L) 07/23/2017   MCV 98.0 07/23/2017   PLT 100 (L) 07/23/2017    Lab Results  Component Value Date   CREATININE 2.88 (H) 07/23/2017   BUN 31 (H) 07/23/2017   NA 138 07/23/2017   K 4.1 07/23/2017   CL 113 (H) 07/23/2017   CO2 16 (L) 07/23/2017    Lab Results  Component Value Date   ALT 43 07/23/2017   AST 87 (H) 07/23/2017   ALKPHOS 80 07/23/2017   BILITOT 0.9 07/23/2017     Microbiology: No results found for this or any previous visit (from the past 240 hour(s)).  Janene Madeira, MSN, NP-C [facility] for Infectious [place] Cell: [phone] Pager: [phone]  07/23/2017 4:03 PM

## 2017-07-23 NOTE — Progress Notes (Signed)
Pharmacy Antibiotic Note  Cabot Cromartie is a 52 y.o. male admitted on 07/22/2017 with sepsis.  Pharmacy has been consulted for vancomycin and zosyn dosing. Vancomycin 1gm and zosyn 3.375 gm given in ED  Plan: Vancomycin 750 mg IV q24 hours Zosyn 3.375 gm IV q8 hours F/u renal function, cultures and clinical course  Height: 5' 8"  (172.7 cm) Weight: 170 lb 6.7 oz (77.3 kg) IBW/kg (Calculated) : 68.4  Temp (24hrs), Avg:98.6 F (37 C), Min:98.5 F (36.9 C), Max:98.7 F (37.1 C)  Recent Labs  Lab 07/23/17 0419  WBC 3.7*  CREATININE 2.88*    Estimated Creatinine Clearance: 29.7 mL/min (A) (by C-G formula based on SCr of 2.88 mg/dL (H)).    Allergies not on file   Thank you for allowing pharmacy to be a part of this patient's care.  Excell Seltzer [place] 07/23/2017 6:05 AM

## 2017-07-23 NOTE — Progress Notes (Signed)
Pt complaining of pain in his left shoulder unrelieved by 40m Oxycodone given at 0228. He also received 0.549mXanax at that time. He is asking for more pain medicine. Paged on call clinician for [facility].

## 2017-07-23 NOTE — Clinical Social Work Note (Signed)
Clinical Social Work Assessment  Patient Details  Name: Glenn Alvarado MRN: [id number] Date of Birth: [birth year]  Date of referral:  07/23/17               Reason for consult:  Housing Concerns/Homelessness, Capacity, Suicide Risk/Attempt, Discharge Planning, Family Concerns, Substance Use/ETOH Abuse                Permission sought to share information with:  Family Supports Permission granted to share information::  Yes, Verbal Permission Granted  Name::     Glenn Alvarado & Glenn Alvarado; Glenn Alvarado  Agency::     Relationship::  pt brother and brother's partner; pt sister  Contact Information:   [phone] (cell- Glenn Alvarado)  Housing/Transportation Living arrangements for the past 2 months:  No permanent address, Single Family Home, Homeless Source of Information:  Patient, Other (Comment Required)(pt's brothers partner) Patient Interpreter Needed:  None Criminal Activity/Legal Involvement Pertinent to Current Situation/Hospitalization:  No - Comment as needed Significant Relationships:  Other Family Members, Dependent Children Lives with:  Relatives Do you feel safe going back to the place where you live?  Yes Need for family participation in patient care:  Yes (Comment)  Care giving concerns:  Pt states no concerns regarding his care or living situation. Pt only states that he wants to have MRI so he can figure out what's going on and leave.   [name] Glenn Alvarado states that she has multiple concerns: pt has threatened self harm and harm to her and his brother on multiple occassions in the past year plus. Glenn Alvarado and her partner (pt's brother) have requested IVC for pt twice in the last year. Glenn Alvarado states that she feels like each time he gets sent back home and repeats his behavioral patterns. Pt had been under IVC at [facility], per note on pt's hard chart he had been psychiatrically cleared and the IVC was rescinded. Glenn Alvarado did mention that she had pursued this IVC with his brother and that she  knew a psychiatrist had seen him at [place]. This Probation officer stated that if pt had been psychiatrically cleared that pt is not currently under IVC here.   [name] has "left him and taken the kids." Pt has good support from his family, per Glenn Alvarado "a lot of people care about him." But, per Glenn Alvarado pt has chronic issue with substance use and abuse and she is concerned both that he will hurt her or someone else and that he will leave the hospital with opiates/benzodiazepines and continue a pattern of abusive behavior with substances. Glenn Alvarado states that pt has been "to [facility] for detox but he left after 24 hours." Glenn Alvarado states that she feels hopeless about getting pt help with substance use.    Social Worker assessment / plan:  After receiving call from [place] through Financial controller CSW met with pt at bedside. Pt confirmed it is okay to speak with Glenn Alvarado, states that her last name is Gean Quint and she is his brother's girlfriend. Pt states that he lives in [place] and was at [facility] prior to coming to [facility]. Pt requesting to know why he can't have MRI and leave, since "I already had one at [place] [place]." CSW encouraged pt to ask RN and MD when they round these questions. CSW again verified that it was okay for CSW to share information with pt contact [place] via telephone and return her call. Pt states "yes that's fine, I dont know what shes calling about, my sister Glenn Alvarado is coming up  here to see me too."   CSW f/u with pt brother's partner Glenn Alvarado. Pt family concerns are listed above. CSW provided emotional support to [place] as she processed the difficulty of trying to get help for pt. CSW also discussed the ability to request a psychiatric consult for pt here at [facility]. CSW also discussed that if pt is cleared psychiatrically here as well that he has the option of refusing treatment and leaving; pt also has this choice at substance use treatment facilities and pt must willingly pursue treatment in  order for him to stay and complete a program. Pt has the sign himself out and agree/disagree with treatment until it is documented otherwise. Glenn Alvarado states understanding. CSW will reach out to attending MD to request psychiatric consult and will also provide family with information about inpatient and outpatient substance abuse treatment options if pt so desires.   Employment status:  Disabled (Comment on whether or not currently receiving Disability) Insurance information:  Medicare, Catering manager PT Recommendations:  Not assessed at this time Information / Referral to community resources:  Substance Abuse Treatment (inpatient/outpatient)  Patient/Family's Response to care:  *Pt amenable to [place] visit, gave permission for care team to speak with family including Glenn Alvarado. Glenn Alvarado also appreciative of return call and understands CSW role/limitations.   Patient/Family's Understanding of and Emotional Response to Diagnosis, Current Treatment, and Prognosis:  Pt does not seem to grasp his diagnosis, current treatment or prognosis, given that he is wondering why he has to have an MRI and why he was transferred to [facility]. Pt family Glenn Alvarado) grasp pt diagnosis and current treatment, pt family per Glenn Alvarado, seem to fear pt prognosis and the future if pt does discharge and continues to use substances. While on phone Glenn Alvarado spoke very rushed and verbally sounded stressed and hopeless. CSW allowed pt family to share and decompress about their journey and hopes for pt to get better and accept help.   Emotional Assessment Appearance:  Appears stated age Attitude/Demeanor/Rapport:  Engaged, Suspicious Affect (typically observed):  Accepting, Appropriate, Calm Orientation:  Oriented to Place, Oriented to Self, Oriented to  Time, Oriented to Situation Alcohol / Substance use:  Illicit Drugs, Tobacco Use Psych involvement (Current and /or in the community):  No (Comment)  Discharge Needs  Concerns to be  addressed:  Discharge Planning Concerns, Patient refuses services, Substance Abuse Concerns, Mental Health Concerns Readmission within the last 30 days:  No Current discharge risk:  Inadequate Financial Supports, Substance Abuse Barriers to Discharge:  Active Substance Use, Family Issues   Alexander Mt, LCSWA 07/23/2017, 2:41 PM
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
## 2017-07-23 NOTE — Discharge Instructions (Signed)
[facility] Stay Proper nutrition can help your body recover from illness and injury.   Foods and beverages high in protein, vitamins, and minerals help rebuild muscle loss, promote healing, & reduce fall risk.   In addition to eating healthy foods, a nutrition shake is an easy, delicious way to get the nutrition you need during and after your hospital stay  It is recommended that you continue to drink 2 bottles per day of:       Ensure Plus or Ensure Enlive for at least 1 month (30 days) after your hospital stay   Tips for adding a nutrition shake into your routine: As allowed, drink one with vitamins or medications instead of water or juice Enjoy one as a tasty mid-morning or afternoon snack Drink cold or make a milkshake out of it Drink one instead of milk with cereal or snacks Use as a coffee creamer   Available at the following grocery stores and pharmacies:           * [place] [phone]            For COUPONS visit: www.ensure.com/join or [email or web address]-[email or web address]   Suggested Substitutions Ensure Plus = Boost Plus = Carnation Breakfast Essentials = Boost Compact Ensure Active Clear = Boost Breeze Glucerna Shake = Boost Glucose Control = Carnation Breakfast Essentials SUGAR FREE

## 2017-07-23 NOTE — Progress Notes (Signed)
PROGRESS NOTE  Glenn Alvarado DEY:[id number] DOB: 1965-07-13 DOA: 07/22/2017 PCP: Glenn Alvarado, No Pcp Per   LOS: 1 day   Brief Narrative / Interim history: This is a 52 year old male with history of coronary artery disease, hypertension, hyperlipidemia, COPD, depression, anxiety, substance abuse disorder was admitted to [facility] few days ago with acute encephalopathy and questionable suicidal ideation.  In the ED at [facility] he was noted to have leukocytosis, renal failure, anion gap metabolic acidosis, elevated bilirubin and AST as well as worsening anemia from his baseline.  Urine drug screen was positive for amphetamines and benzodiazepines.  Following admission Glenn Alvarado improved, his encephalopathy resolved.  Due to concern for suicidal ideation psychiatry was consulted and the Glenn Alvarado was cleared from psychiatry standpoint.  He incidentally was noted to have 1/4 blood cultures positive for MRSA.  He has also been complaining of worsening left shoulder pain and underwent the plain imaging as well as a CT scan which was concerning for osteomyelitis of the glenoid humeral joint.  Orthopedics were consulted and recommended transfer to a tertiary center and he was moved to [facility].  Outside Records Echocardiogram 5/18 CONCLUSIONS  -----------  1. There is normal global left ventricular contractility.  2. Overall left ventricular systolic function is normal with, an EF between 65 - 70 %.  3. The right ventricular systolic function is normal.  4. There is mild aortic valve sclerosis.  5. Mild tricuspid regurgitation present.   Left upper extremity CT 5/19 IMPRESSION:  1. Permeative appearance of the humeral head and glenoid with scattered cortical erosion, most prominently involving the greater tuberosity. Given associated moderate glenohumeral joint effusion, findings are most concerning for septic arthritis with osteomyelitis. Recommend further evaluation with shoulder aspiration  or surgical drainage.   CT head 5/16 No acute intracranial pathology.   Chest x-ray 07/19/17 Ill-defined density seen in right midlung which is slightly smaller compared to prior exam. This may represent focal inflammation or atelectasis, but underlying neoplasm cannot be excluded. Followup PA and lateral chest X-ray is recommended in 3-4 weeks following trial  of antibiotic therapy to ensure resolution and exclude underlying malignancy.   CT abdomen urogram 5/16 Bilateral nonobstructive nephrolithiasis. No hydronephrosis or renal obstruction is noted.  Findings consistent with hepatic cirrhosis.  Stable large solitary gallstone is noted. Gallbladder distention is noted without inflammatory findings.  Microbiology Blood cultures 5/16 - 1/2 MRSA +, sensitive to Bactrim, no data on Clindamycin or Tetracycline Cultures became positive on 5/17   Repeat blood cultures were ordered and Glenn Alvarado was started on Vancomycin.   Repeat blood cultures on 5/17 have remained negative and are final.   Assessment & Plan: Principal Problem:   Osteomyelitis of left shoulder ([name]) Active Problems:   CAD (coronary artery disease)   Depression with anxiety   Essential hypertension   GERD (gastroesophageal reflux disease)   HLD (hyperlipidemia)   Substance abuse ([place])   AKI (acute kidney injury) ([place])   Pancytopenia ([place])   Acute metabolic encephalopathy   Septic joint of left shoulder region ([name])   Abnormal LFTs   Tobacco abuse   COPD (chronic obstructive pulmonary disease) ([place])   MRSA bacteremia  -Cultures were obtained on admission on 5/16 and on 5/17 showed 1 out of 2 MRSA -Repeat cultures were ordered on 5/17 and Glenn Alvarado was started on vancomycin.  Repeat cultures have remained negative and they are finalized -He did have a leukocytosis of 18 on admission which improved to 6.7 the next day (not on antibiotics  in the first day while hospitalized at [facility] -2D echo as above, no evidence of  endocarditis -Glenn Alvarado denies any IV drug use, "he hates needles" -Started on vancomycin and Zosyn here, discontinue Zosyn and continue vancomycin alone -Infectious disease consulted, appreciate input, discussed with Dr. Baxter Flattery  Acute metabolic encephalopathy -Present during [facility] hospitalization, not here.  This completely resolved.  It was thought to be in the setting of questionable toxic overdose with reported suicidal ideation -UDS positive for amphetamines and benzodiazepines, Glenn Alvarado is on Xanax which is prescribed by his PCP but denies use of amphetamines, tells me that his daughter is using -Psychiatry consulted, he is IVC over there was rescinded  Acute kidney injury -His creatinine early 2019 was 0.8, creatinine on presentation at [facility] was 3.3, currently at 2.8 -Continue fluids  Liver cirrhosis / LFT elevation -With AST greater than ALT there is concern for alcohol use however Glenn Alvarado states that he has not had any alcohol in 15 years.  We will continue to monitor.  Bilirubin is normal. -He is followed at [facility] for his liver disease  Pancytopenia -Component of iron deficiency, but most likely in the setting of chronic liver disease -Continue to monitor, hemoglobin is 7.9 platelets are 100.  Transfuse for hemoglobin less than 7  Nephrolithiasis -Incidentally found on the CT scan, nonobstructive  Substance abuse disorder -Denies IV drug use, UDS was positive for amphetamines and benzodiazepines.  Gallstones -Asymptomatic, this can be followed up as an outpatient   DVT prophylaxis: SCDs Code Status: Full code Family Communication: No family at bedside Disposition Plan: Home when ready  Consultants:   Infectious disease  Orthopedic surgery  Procedures:   None   Antimicrobials:  Vancomycin 5/19 >>   Zosyn 5/19 >> 5/20    Subjective: -Planes of left shoulder pain, tells me needs somewhat chronic but has gotten worse.  In the last few days he is  unable to raise his left arm  Objective: Vitals:   07/23/17 0118 07/23/17 0200 07/23/17 0426  BP: (!) 143/79  131/78  Pulse: 63  70  Resp: 20  20  Temp: 98.5 F (36.9 C)  98.7 F (37.1 C)  TempSrc: Oral  Oral  SpO2: 100%  100%  Weight:  77.3 kg (170 lb 6.7 oz)   Height:  5' 8"  (1.727 m)     Intake/Output Summary (Last 24 hours) at 07/23/2017 0726 Last data filed at 07/23/2017 0600 Gross per 24 hour  Intake 689.58 ml  Output 400 ml  Net 289.58 ml   Filed Weights   07/23/17 0200  Weight: 77.3 kg (170 lb 6.7 oz)    Examination:  Constitutional: NAD Eyes: PERRL, lids and conjunctivae normal ENMT: Mucous membranes are moist. No oropharyngeal exudates Neck: normal, supple, no masses, no thyromegaly Respiratory: clear to auscultation bilaterally, no wheezing, no crackles. Normal respiratory effort. No accessory muscle use.  Cardiovascular: Regular rate and rhythm, no murmurs / rubs / gallops. No LE edema. 2+ pedal pulses. No carotid bruits.  Abdomen: no tenderness. Bowel sounds positive.  Musculoskeletal: no clubbing / cyanosis.  Tenderness to palpation around the left shoulder Skin: no rashes Neurologic: CN 2-12 grossly intact. Strength 5/5 in all 4.  Psychiatric: Normal judgment and insight. Alert and oriented x 3. Normal mood.    Data Reviewed: I have independently reviewed following labs and imaging studies   CBC: Recent Labs  Lab 07/23/17 0419  WBC 3.7*  NEUTROABS 2.2  HGB 7.9*  HCT 24.3*  MCV 98.0  PLT 950*   Basic Metabolic Panel: Recent Labs  Lab 07/23/17 0419  NA 138  K 4.1  CL 113*  CO2 16*  GLUCOSE 104*  BUN 31*  CREATININE 2.88*  CALCIUM 7.8*   GFR: Estimated Creatinine Clearance: 29.7 mL/min (A) (by C-G formula based on SCr of 2.88 mg/dL (H)). Liver Function Tests: Recent Labs  Lab 07/23/17 0419  AST 87*  ALT 43  ALKPHOS 80  BILITOT 0.9  PROT 7.7  ALBUMIN 2.2*   No results for input(s): LIPASE, AMYLASE in the last 168 hours. No  results for input(s): AMMONIA in the last 168 hours. Coagulation Profile: Recent Labs  Lab 07/23/17 0419  INR 1.45   Cardiac Enzymes: No results for input(s): CKTOTAL, CKMB, CKMBINDEX, TROPONINI in the last 168 hours. BNP (last 3 results) No results for input(s): PROBNP in the last 8760 hours. HbA1C: Recent Labs    07/23/17 0621  HGBA1C 4.4*   CBG: No results for input(s): GLUCAP in the last 168 hours. Lipid Profile: Recent Labs    07/23/17 0419  CHOL 95  HDL 17*  LDLCALC 65  TRIG 64  CHOLHDL 5.6   Thyroid Function Tests: No results for input(s): TSH, T4TOTAL, FREET4, T3FREE, THYROIDAB in the last 72 hours. Anemia Panel: No results for input(s): VITAMINB12, FOLATE, FERRITIN, TIBC, IRON, RETICCTPCT in the last 72 hours. Urine analysis: No results found for: COLORURINE, APPEARANCEUR, LABSPEC, PHURINE, GLUCOSEU, HGBUR, BILIRUBINUR, KETONESUR, PROTEINUR, UROBILINOGEN, NITRITE, LEUKOCYTESUR Sepsis Labs: Invalid input(s): PROCALCITONIN, LACTICIDVEN  No results found for this or any previous visit (from the past 240 hour(s)).    Radiology Studies: No results found.   Scheduled Meds: . nicotine  21 mg Transdermal Daily   Continuous Infusions: . sodium chloride 125 mL/hr at 07/23/17 0229  . piperacillin-tazobactam (ZOSYN)  IV    . vancomycin       Time spent: 45 minutes, more than 50% bedside and on the floor, reviewing the records from [place] as described above   Marzetta Board, MD, PhD [facility] Pager [phone]  If 7PM-7AM, please contact night-coverage [email or web address] Password [facility] 07/23/2017, 7:26 AM

## 2017-07-23 NOTE — Social Work (Signed)
See assessment note for full details. Pt brother's partner [name] called CSW to express concerns for pt. When CSW had gotten permission to speak with [name] from pt CSW called [name] back. Pt family is concerned about pt substance use. Pt family also expressed that pt had stated threats of self harm and harm to others, they are requesting a psych consult.   Because pt family states that pt has communicated threats to himself and others CSW requesting f/u psychiatric consult from attending MD.  Pt family aware that psychiatry at [facility] did clear pt, and they will try and make themselves available during care team rounds to understand care team determinations and plan of care.   Attending MD has been paged and is agreeable to psychiatric consult given statement of threats.   Alexander Mt, [place] Work [phone]

## 2017-07-23 NOTE — Progress Notes (Signed)
Initial Nutrition Assessment  DOCUMENTATION CODES:   Not applicable  INTERVENTION:   - Once diet advanced, Ensure Enlive po BID, each supplement provides 350 kcal and 20 grams of protein (pt prefers chocolate flavor)  - Encourage PO intake  NUTRITION DIAGNOSIS:   Increased nutrient needs related to chronic illness(COPD) as evidenced by estimated needs.  GOAL:   Patient will meet greater than or equal to 90% of their needs  MONITOR:   Supplement acceptance, Diet advancement, Labs, Weight trends  REASON FOR ASSESSMENT:   Malnutrition Screening Tool    ASSESSMENT:   52 year old male with PMH significant for hypertension, hyperlipidemia, COPD, GERD, depression, anxiety, CAD, substance abuse, and tobacco abuse. Pt presented to [facility] on 07/19/17 with AMS likely due to drug abuse and was transferred to [facility] for evaluation and treatment of left shoulder pain.  Per Orthopedics, plan for MRI to rule out infection. If infection suspected, washout in the OR would likely be required.  Spoke with pt at bedside. Pt reports losing weight after separating from his wife and caring for his three children. Pt reports wanting to make sure his children had enough to eat and therefore did not have enough to eat himself. Pt reports his UBW as 190 lbs and that he lost weight down to 160 lbs. Pt reports last weighing 160 lbs in April 2019. Per weight in chart, pt is now up to 170 lbs. No weight history available in chart.  Pt reports that he has been trying to gain weight back by drinking two Boost High Protein oral nutrition supplements daily. Pt agreeable to receiving Ensure Enlive BID during current hospitalization. RD to order.  Pt also reports good appetite and PO intake PTA that included 3 meals daily. Breakfast may include [place] toast or pancakes with bacon and grits. Lunch may include chuckwagon patties with gravy and potatoes. Dinner may include pizza or hamburgers and tater  tots.  Pt reports being very active PTA including exercise with weights and hiking.  Pt denies any nausea and vomiting. Pt also denies difficulty chewing or swallowing. Pt states he is very hungry and is looking forward to eating again after MRI.  Medications reviewed.  Labs reviewed: CO2 16 (L), BUN 31 (H), creatinine 2.88 (H), calcium 7.8 (L), HDL 17 (L), vitamin B-12 938 (H), hemoglobin 7.9 (L), HCT 24.3 (L), hemoglobin A1C 4.4  NUTRITION - FOCUSED PHYSICAL EXAM:    Most Recent Value  Orbital Region  Mild depletion  Upper Arm Region  Mild depletion  Thoracic and Lumbar Region  No depletion  Buccal Region  No depletion  Temple Region  Mild depletion  Clavicle Bone Region  No depletion  Clavicle and Acromion Bone Region  No depletion  Scapular Bone Region  No depletion  Dorsal Hand  No depletion  Patellar Region  No depletion  Anterior Thigh Region  No depletion  Posterior Calf Region  No depletion  Edema (RD Assessment)  None  Hair  Reviewed  Eyes  Reviewed  Mouth  Reviewed  Skin  Reviewed  Nails  Reviewed       Diet Order:   Diet Order           Diet NPO time specified Except for: Sips with Meds, Ice Chips  Diet effective now          EDUCATION NEEDS:   No education needs have been identified at this time  Skin:  Skin Assessment: Reviewed RN Assessment  Last BM:  07/22/17  Height:  Ht Readings from Last 1 Encounters:  07/23/17 5' 8"  (1.727 m)    Weight:   Wt Readings from Last 1 Encounters:  07/23/17 170 lb 6.7 oz (77.3 kg)    Ideal Body Weight:  70 kg  BMI:  Body mass index is 25.91 kg/m.  Estimated Nutritional Needs:   Kcal:  2100-2300 kcal/day  Protein:  95-110 grams/day  Fluid:  2.1-2.3 L/day    Gaynell Face, MS, RD, LDN Pager: [phone] Weekend/After Hours: [phone]

## 2017-07-24 ENCOUNTER — Other Ambulatory Visit: Payer: Self-pay

## 2017-07-24 ENCOUNTER — Encounter (HOSPITAL_COMMUNITY): Payer: Self-pay | Admitting: Certified Registered Nurse Anesthetist

## 2017-07-24 ENCOUNTER — Encounter (HOSPITAL_COMMUNITY)
Admission: AD | Disposition: A | Discharge status: Home / Self Care | Payer: Self-pay | Source: Other Acute Inpatient Hospital | Attending: Internal Medicine

## 2017-07-24 ENCOUNTER — Inpatient Hospital Stay (HOSPITAL_COMMUNITY): Payer: Medicare Other | Admitting: Anesthesiology

## 2017-07-24 DIAGNOSIS — Z6379 Other stressful life events affecting family and household: Secondary | ICD-10-CM

## 2017-07-24 DIAGNOSIS — F1590 Other stimulant use, unspecified, uncomplicated: Secondary | ICD-10-CM

## 2017-07-24 DIAGNOSIS — F419 Anxiety disorder, unspecified: Secondary | ICD-10-CM

## 2017-07-24 DIAGNOSIS — F1390 Sedative, hypnotic, or anxiolytic use, unspecified, uncomplicated: Secondary | ICD-10-CM

## 2017-07-24 DIAGNOSIS — R768 Other specified abnormal immunological findings in serum: Secondary | ICD-10-CM

## 2017-07-24 DIAGNOSIS — R450 Nervousness: Secondary | ICD-10-CM

## 2017-07-24 DIAGNOSIS — F329 Major depressive disorder, single episode, unspecified: Secondary | ICD-10-CM

## 2017-07-24 DIAGNOSIS — Z811 Family history of alcohol abuse and dependence: Secondary | ICD-10-CM

## 2017-07-24 DIAGNOSIS — R7881 Bacteremia: Secondary | ICD-10-CM

## 2017-07-24 DIAGNOSIS — Z635 Disruption of family by separation and divorce: Secondary | ICD-10-CM

## 2017-07-24 DIAGNOSIS — B9562 Methicillin resistant Staphylococcus aureus infection as the cause of diseases classified elsewhere: Secondary | ICD-10-CM

## 2017-07-24 LAB — SURGICAL PCR SCREEN
MRSA, PCR: NEGATIVE
STAPHYLOCOCCUS AUREUS: POSITIVE — AB

## 2017-07-24 LAB — CBC
HEMATOCRIT: 27.6 % — AB (ref 39.0–52.0)
Hemoglobin: 8.9 g/dL — ABNORMAL LOW (ref 13.0–17.0)
MCH: 32 pg (ref 26.0–34.0)
MCHC: 32.2 g/dL (ref 30.0–36.0)
MCV: 99.3 fL (ref 78.0–100.0)
Platelets: 111 10*3/uL — ABNORMAL LOW (ref 150–400)
RBC: 2.78 MIL/uL — AB (ref 4.22–5.81)
RDW: 15.6 % — AB (ref 11.5–15.5)
WBC: 5.3 10*3/uL (ref 4.0–10.5)

## 2017-07-24 LAB — BASIC METABOLIC PANEL
Anion gap: 7 (ref 5–15)
BUN: 31 mg/dL — AB (ref 6–20)
CHLORIDE: 109 mmol/L (ref 101–111)
CO2: 20 mmol/L — AB (ref 22–32)
Calcium: 8.1 mg/dL — ABNORMAL LOW (ref 8.9–10.3)
Creatinine, Ser: 2.93 mg/dL — ABNORMAL HIGH (ref 0.61–1.24)
GFR calc Af Amer: 27 mL/min — ABNORMAL LOW (ref >60)
GFR calc non Af Amer: 23 mL/min — ABNORMAL LOW (ref >60)
Glucose, Bld: 93 mg/dL (ref 65–99)
POTASSIUM: 4 mmol/L (ref 3.5–5.1)
SODIUM: 136 mmol/L (ref 135–145)

## 2017-07-24 LAB — HEPATITIS PANEL, ACUTE
HEP A IGM: NEGATIVE
HEP B S AG: POSITIVE — AB
Hep B C IgM: POSITIVE — AB

## 2017-07-24 LAB — GLUCOSE, CAPILLARY: Glucose-Capillary: 90 mg/dL (ref 65–99)

## 2017-07-24 SURGERY — ARTHROSCOPY, SHOULDER

## 2017-07-24 MED ORDER — DAPTOMYCIN 500 MG IV SOLR
8.0000 mg/kg | INTRAVENOUS | Status: DC
  Administered 2017-07-24: 618.5 mg via INTRAVENOUS
  Filled 2017-07-24: qty 12.37

## 2017-07-24 MED ORDER — ROCURONIUM BROMIDE 10 MG/ML (PF) SYRINGE
PREFILLED_SYRINGE | INTRAVENOUS | Status: AC
  Filled 2017-07-24: qty 5

## 2017-07-24 MED ORDER — MORPHINE SULFATE (PF) 2 MG/ML IV SOLN
2.0000 mg | Freq: Once | INTRAVENOUS | Status: AC
  Administered 2017-07-24: 2 mg via INTRAVENOUS
  Filled 2017-07-24: qty 1

## 2017-07-24 MED ORDER — FENTANYL CITRATE (PF) 250 MCG/5ML IJ SOLN
INTRAMUSCULAR | Status: AC
  Filled 2017-07-24: qty 5

## 2017-07-24 MED ORDER — CHLORHEXIDINE GLUCONATE CLOTH 2 % EX PADS
6.0000 | MEDICATED_PAD | Freq: Every day | CUTANEOUS | Status: AC
  Administered 2017-07-25 – 2017-07-28 (×4): 6 via TOPICAL

## 2017-07-24 MED ORDER — MORPHINE SULFATE (PF) 4 MG/ML IV SOLN
4.0000 mg | Freq: Once | INTRAVENOUS | Status: AC
  Administered 2017-07-24: 4 mg via INTRAVENOUS
  Filled 2017-07-24: qty 1

## 2017-07-24 MED ORDER — MIDAZOLAM HCL 2 MG/2ML IJ SOLN
INTRAMUSCULAR | Status: AC
  Filled 2017-07-24: qty 2

## 2017-07-24 MED ORDER — CHLORHEXIDINE GLUCONATE 4 % EX LIQD
60.0000 mL | Freq: Once | CUTANEOUS | Status: AC
  Administered 2017-07-25: 4 via TOPICAL

## 2017-07-24 MED ORDER — PROPOFOL 10 MG/ML IV BOLUS
INTRAVENOUS | Status: AC
  Filled 2017-07-24: qty 40

## 2017-07-24 MED ORDER — MUPIROCIN 2 % EX OINT
1.0000 "application " | TOPICAL_OINTMENT | Freq: Two times a day (BID) | CUTANEOUS | Status: AC
  Administered 2017-07-24 – 2017-07-29 (×9): 1 via NASAL
  Filled 2017-07-24 (×2): qty 22

## 2017-07-24 MED ORDER — SODIUM CHLORIDE 0.9 % IV SOLN
INTRAVENOUS | Status: DC
  Administered 2017-07-24: 13:00:00 via INTRAVENOUS

## 2017-07-24 MED ORDER — LIDOCAINE 2% (20 MG/ML) 5 ML SYRINGE
INTRAMUSCULAR | Status: AC
  Filled 2017-07-24: qty 5

## 2017-07-24 NOTE — Anesthesia Preprocedure Evaluation (Signed)
Anesthesia Evaluation  Patient identified by MRN, date of birth, ID band Patient awake    Reviewed: Allergy & Precautions, NPO status , Patient's Chart, lab work & pertinent test results  Airway        Dental   Pulmonary COPD, Current Smoker,           Cardiovascular hypertension, + CAD       Neuro/Psych Anxiety Depression negative neurological ROS     GI/Hepatic GERD  ,(+)     substance abuse  methamphetamine use,   Endo/Other  negative endocrine ROS  Renal/GU ARFRenal disease  negative genitourinary   Musculoskeletal negative musculoskeletal ROS (+)   Abdominal   Peds  Hematology  (+) anemia , Thrombocytopenia   Anesthesia Other Findings   Reproductive/Obstetrics                             Anesthesia Physical Anesthesia Plan Anesthesia Quick Evaluation

## 2017-07-24 NOTE — Progress Notes (Signed)
Patient now reporting that he had milk and 5 pieces of cereal around 9:30. Per Dr. Fransisco Beau 6 hours from 9:30. Notified OR. Left Dr. Percell Miller a message requesting a return call.
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
## 2017-07-24 NOTE — Progress Notes (Signed)
[facility] Infectious [facility]  Date of Admission:  07/22/2017   Total days of antibiotics 3        Day 1 Daptomycin           Patient ID: Glenn Alvarado is a 52 y.o. 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 feeding supplement (ENSURE ENLIVE)  237 mL Oral BID BM  . lidocaine (PF)  5 mL Other Once  . nicotine  21 mg Transdermal Daily  . sodium chloride  10 mL Intravenous Once  . tenofovir  300 mg Oral Q72H    SUBJECTIVE: Hurting a lot. Tells me he is having surgery this evening.   Not on File  OBJECTIVE: Vitals:   07/23/17 1436 07/23/17 1900 07/23/17 2333 07/24/17 0512  BP: 140/81 (!) 162/80 125/65 138/84  Pulse: 61 65 73 81  Resp:   16 18  Temp: (!) 97.3 F (36.3 C) (!) 97.5 F (36.4 C) 97.9 F (36.6 C) 98.1 F (36.7 C)  TempSrc: Oral Oral Oral Oral  SpO2: 100% 100% 100% 100%  Weight:      Height:       Body mass index is 25.91 kg/m.  Physical Exam  Constitutional: He is oriented to person, place, and time. He appears well-developed.  Tearful in bed. In pain.   Neck: Normal range of motion.  Cardiovascular: Normal rate, regular rhythm and normal heart sounds.  Pulmonary/Chest: Effort normal and breath sounds normal.  Abdominal: Soft.  Musculoskeletal: He exhibits tenderness (left shoulder ).  Neurological: He is alert and oriented  to person, place, and time.  Skin: Skin is warm and dry.  Psychiatric: Judgment and thought content normal.   Lab Results Lab Results  Component Value Date   WBC 5.3 07/24/2017   HGB 8.9 (L) 07/24/2017   HCT 27.6 (L) 07/24/2017   MCV 99.3 07/24/2017   PLT 111 (L) 07/24/2017    Lab Results  Component Value Date   CREATININE 2.93 (H) 07/24/2017   BUN 31 (H) 07/24/2017   NA 136 07/24/2017   K 4.0 07/24/2017   CL 109 07/24/2017   CO2 20 (L) 07/24/2017    Lab Results  Component Value Date   ALT 43 07/23/2017   AST 87 (H) 07/23/2017   ALKPHOS 80 07/23/2017   BILITOT 0.9 07/23/2017     Microbiology: BCx MRSA 1/4 bottles @ [name]  BCx 5/19 >> Pending    ASSESSMENT: 52 y.o. male with MRSA bacteremia (1/4 bottles @ [facility]) and acute on chronic left shoulder pain. TTE was negative and so far repeat blood cultures on 5/19 are negative. Joint aspiration yesterday so far w/o any organisms on gram stain; however he has been on antibiotics for >72h; unfortunately no cell count was done. MR results highly suspicious for septic arthritis, osteomyelitis and septic bursitis. Also  myofasciitis w/o definite pyomyositis. He is going to go to surgery this evening. We briefly discussed what this means and plan for antibiotics after surgery (including possibility of PICC line however I am not certain he will be able to accommodate this). Will discuss further after surgery pending OR findings - he is very anxious and tearful today.   His creatinine is up again today to 2.93 - will change to daptomycin to limit nephrotoxicity. [facility] at [facility] in April of this year.   Cirrhosis with Chronic [name] infection (on [name] at home since March 2019). Hep C Ab (+) with (-) RNA March 2019 indicating cleared infection. INR 1.28 in April this year. He is thrombocytopenic. [name] sAg and cAb IgM (+) --> DNA pending. (DNA of 22,900,000 IU/mL in 02/2017). Placed on Vired 300 mg Q72h  while inpatient. Adjust dose for renal recovery.   PLAN: 1. Stop vancomycin  2. Start daptomycin 2m/kg Q48h 3. Check CK in AM and then weekly  4. Would recommend tissue/bone/fluid aerobic/anaerobic cultures in [place], NP-C [facility]for Infectious DBullhead CityPager: [phone] 07/24/2017  10:02 AM

## 2017-07-24 NOTE — Progress Notes (Signed)
PROGRESS NOTE  Glenn Alvarado XAJ:[id number] DOB: 04/01/1965 DOA: 07/22/2017 PCP: Glenn Alvarado   LOS: 2 days   Brief Narrative / Interim history: This is a 52 year old male with history of coronary artery disease, hypertension, hyperlipidemia, COPD, depression, anxiety, substance abuse disorder was admitted to [facility] few days ago with acute encephalopathy and questionable suicidal ideation.  In the ED at [facility] he was noted to have leukocytosis, renal failure, anion gap metabolic acidosis, elevated bilirubin and AST as well as worsening anemia from his baseline. Urine drug screen was positive for amphetamines and benzodiazepines.  Following admission patient improved, his encephalopathy resolved.  Due to concern for suicidal ideation psychiatry was consulted and the patient was cleared from psychiatry standpoint.  He incidentally was noted to have 1/4 blood cultures positive for MRSA.  He has also been complaining of worsening left shoulder pain and underwent the plain imaging as well as a CT scan which was concerning for osteomyelitis of the glenoid humeral joint. Orthopedics were consulted and recommended transfer to a tertiary center and he was moved to [facility].  Here orthopedic surgery and ID were consulted.  Underwent an MRI which was positive for septic arthritis and he was taken to the operating room on 5/21  Outside Records Echocardiogram 5/18 CONCLUSIONS  -----------  1. There is normal global left ventricular contractility.  2. Overall left ventricular systolic function is normal with, an EF between 65 - 70 %.  3. The right ventricular systolic function is normal.  4. There is mild aortic valve sclerosis.  5. Mild tricuspid regurgitation present.   Left upper extremity CT 5/19 IMPRESSION:  1. Permeative appearance of the humeral head and glenoid with scattered cortical erosion, most prominently involving the greater tuberosity. Given associated moderate  glenohumeral joint effusion, findings are most concerning for septic arthritis with osteomyelitis. Recommend further evaluation with shoulder aspiration or surgical drainage.   CT head 5/16 No acute intracranial pathology.   Chest x-ray 07/19/17 Ill-defined density seen in right midlung which is slightly smaller compared to prior exam. This may represent focal inflammation or atelectasis, but underlying neoplasm cannot be excluded. Followup PA and lateral chest X-ray is recommended in 3-4 weeks following trial  of antibiotic therapy to ensure resolution and exclude underlying malignancy.   CT abdomen urogram 5/16 Bilateral nonobstructive nephrolithiasis. No hydronephrosis or renal obstruction is noted.  Findings consistent with hepatic cirrhosis.  Stable large solitary gallstone is noted. Gallbladder distention is noted without inflammatory findings.  Microbiology Blood cultures 5/16 - 1/2 MRSA +, sensitive to Bactrim, no data on Clindamycin or Tetracycline Cultures became positive on 5/17   Repeat blood cultures were ordered and patient was started on Vancomycin.   Repeat blood cultures on 5/17 have remained negative and are final.   Assessment & Plan: Principal Problem:   MRSA bacteremia Active Problems:   CAD (coronary artery disease)   Depression with anxiety   Essential hypertension   GERD (gastroesophageal reflux disease)   HLD (hyperlipidemia)   Substance abuse ([name])   AKI (acute kidney injury) ([name])   Pancytopenia ([name])   Acute metabolic encephalopathy   Septic joint of left shoulder region [facility])   Left shoulder pain   Abnormal LFTs   Tobacco abuse   COPD (chronic obstructive pulmonary disease) ([name])   Chronic viral hepatitis B with cirrhosis ([name])   Hepatitis C antibody test positive   MRSA bacteremia  -Cultures were obtained on admission on 5/16 and on 5/17 showed 1 out  of 2 MRSA -Repeat cultures were ordered on 5/17 and patient was started on vancomycin.  Repeat  cultures have remained negative and they are finalized -He did have a leukocytosis of 18 on admission which improved to 6.7 the next day (not on antibiotics in the first day while hospitalized at [facility] -2D echo as above, no evidence of endocarditis -Patient denies any IV drug use, "he hates needles" -Started on vancomycin and Zosyn here, now on daptomycin Alvarado infectious disease  Left shoulder septic arthritis/osteomyelitis -MRI this morning with findings for septic arthritis, discussed with orthopedic surgery, patient will be taken emergently to the operating room on 5/21 -Currently on daptomycin  Acute metabolic encephalopathy -Present during [place] hospitalization, not here.  This completely resolved.  It was thought to be in the setting of questionable toxic overdose with reported suicidal ideation -UDS positive for amphetamines and benzodiazepines, patient is on Xanax which is prescribed by his PCP but denies use of amphetamines, tells me that his daughter is using and he doesn't know how it got into his system  Suicidal/homicidal ideation -I was contacted by social worker yesterday regarding family concerned that patient has expressed threats to self-harm shower and the others around him, I have consulted psychiatry, appreciate input -No current ideation, of note, he was cleared by tele-psychiatry of [name]  Acute kidney injury -His creatinine early 2019 was 0.8, creatinine on presentation at [facility] was 3.3, currently at 2.9 -Continue fluids  Liver cirrhosis / LFT elevation -With AST greater than ALT there is concern for alcohol use however patient states that he has not had any alcohol in 15 years.  We will continue to monitor.  Bilirubin is normal. -He is followed at [facility] for his liver disease  Pancytopenia -Component of iron deficiency, but most likely in the setting of chronic liver disease -Continue to monitor, stable. Platelets  stable.  Nephrolithiasis -Incidentally found on the CT scan, nonobstructive  Substance abuse disorder -Denies IV drug use, UDS was positive for amphetamines and benzodiazepines.  Gallstones -Asymptomatic, this can be followed up as an outpatient   DVT prophylaxis: SCDs Code Status: Full code Family Communication: No family at bedside Disposition Plan: Home when ready  Consultants:   Infectious disease  Orthopedic surgery  Procedures:   None   Antimicrobials:  Vancomycin 5/19 >> 5/21  Zosyn 5/19 >> 5/20   Daptomycin 5/21 >>   Subjective: -Appears uncomfortable, tells me that he rolled in bed earlier on his left shoulder is not extremely tender.  Denies any fever or chills.  No shortness of breath.  Discussed about the MRI findings, patient appears emotional started crying  Objective: Vitals:   07/23/17 1900 07/23/17 2333 07/24/17 0512 07/24/17 1208  BP: (!) 162/80 125/65 138/84 (!) 143/89  Pulse: 65 73 81 85  Resp:  16 18 16   Temp: (!) 97.5 F (36.4 C) 97.9 F (36.6 C) 98.1 F (36.7 C) 98.4 F (36.9 C)  TempSrc: Oral Oral Oral Oral  SpO2: 100% 100% 100% 100%  Weight:      Height:        Intake/Output Summary (Last 24 hours) at 07/24/2017 1246 Last data filed at 07/24/2017 1209 Gross Alvarado 24 hour  Intake 1690 ml  Output 2900 ml  Net -1210 ml   Filed Weights   07/23/17 0200  Weight: 77.3 kg (170 lb 6.7 oz)    Examination:  Constitutional: NAD Eyes: no scleral icterus  ENMT: mmm Respiratory: CTA biL, no wheezing or crackles   Cardiovascular:  RRR without murmurs. No edema  Abdomen: soft, non tender, non distended. BS + Musculoskeletal: no clubbing / cyanosis.  Tenderness to palpation around the left shoulder Skin: no rashes seen Neurologic: non focal    Data Reviewed: I have independently reviewed following labs and imaging studies   CBC: Recent Labs  Lab 07/23/17 0419 07/24/17 0437  WBC 3.7* 5.3  NEUTROABS 2.2  --   HGB 7.9* 8.9*   HCT 24.3* 27.6*  MCV 98.0 99.3  PLT 100* 458*   Basic Metabolic Panel: Recent Labs  Lab 07/23/17 0419 07/24/17 0437  NA 138 136  K 4.1 4.0  CL 113* 109  CO2 16* 20*  GLUCOSE 104* 93  BUN 31* 31*  CREATININE 2.88* 2.93*  CALCIUM 7.8* 8.1*   GFR: Estimated Creatinine Clearance: 29.2 mL/min (A) (by C-G formula based on SCr of 2.93 mg/dL (H)). Liver Function Tests: Recent Labs  Lab 07/23/17 0419  AST 87*  ALT 43  ALKPHOS 80  BILITOT 0.9  PROT 7.7  ALBUMIN 2.2*   No results for input(s): LIPASE, AMYLASE in the last 168 hours. No results for input(s): AMMONIA in the last 168 hours. Coagulation Profile: Recent Labs  Lab 07/23/17 0419  INR 1.45   Cardiac Enzymes: No results for input(s): CKTOTAL, CKMB, CKMBINDEX, TROPONINI in the last 168 hours. BNP (last 3 results) No results for input(s): PROBNP in the last 8760 hours. HbA1C: Recent Labs    07/23/17 0621  HGBA1C 4.4*   CBG: Recent Labs  Lab 07/23/17 0742 07/24/17 0808  GLUCAP 88 90   Lipid Profile: Recent Labs    07/23/17 0419  CHOL 95  HDL 17*  LDLCALC 65  TRIG 64  CHOLHDL 5.6   Thyroid Function Tests: No results for input(s): TSH, T4TOTAL, FREET4, T3FREE, THYROIDAB in the last 72 hours. Anemia Panel: Recent Labs    07/23/17 0621  VITAMINB12 938*  FOLATE 22.2  FERRITIN 82  TIBC 316  IRON 34*  RETICCTPCT 1.3   Urine analysis: No results found for: COLORURINE, APPEARANCEUR, LABSPEC, PHURINE, GLUCOSEU, HGBUR, BILIRUBINUR, KETONESUR, PROTEINUR, UROBILINOGEN, NITRITE, LEUKOCYTESUR Sepsis Labs: Invalid input(s): PROCALCITONIN, LACTICIDVEN  Recent Results (from the past 240 hour(s))  Body fluid culture     Status: None (Preliminary result)   Collection Time: 07/23/17  6:10 PM  Result Value Ref Range Status   Specimen Description FLUID SYNOVIAL SHOULDER  Final   Special Requests NONE  Final   Gram Stain   Final    FEW WBC PRESENT,BOTH PMN AND MONONUCLEAR NO ORGANISMS SEEN    Culture    Final    NO GROWTH < 24 HOURS Performed at [facility], [address], [place], [place] [address]    Report Status PENDING  Incomplete      Radiology Studies: Mr Shoulder Left Wo Contrast  Result Date: 07/24/2017 CLINICAL DATA:  Left shoulder pain and leukocytosis EXAM: MRI OF THE LEFT SHOULDER WITHOUT CONTRAST TECHNIQUE: Multiplanar, multisequence MR imaging of the shoulder was performed. No intravenous contrast was administered. COMPARISON:  CT scan 07/22/2017 FINDINGS: Diffuse abnormal T1 and T2 signal intensity in the humerus and scapula worrisome for osteomyelitis. Cystic appearing change in the greater tuberosity could be a bone abscess. There is a joint effusion and severe synovitis with findings suspicious for septic arthritis. There is also marked edema like signal abnormality in the supraspinatus, infraspinatus and teres minor muscles worrisome for myofasciitis without definite pyomyositis. Significant rotator cuff tendinopathy/tendinosis. There is also an articular surface tear involving  the supraspinatus tendon and interstitial tears involving the infraspinatus tendon. Findings suspicious for septic subacromial/subdeltoid bursitis. IMPRESSION: MR findings highly suspicious for septic arthritis, osteomyelitis and septic bursitis. There is also myofasciitis without definite findings for pyomyositis. These results will be called to the ordering clinician or representative by the Radiologist Assistant, and communication documented in the PACS or zVision Dashboard. Electronically Signed   By: Marijo Sanes M.D.   On: 07/24/2017 08:54   Dg Fluoro Guided Needle Plc Aspiration/injection Loc  Addendum Date: 07/24/2017   ADDENDUM REPORT: 07/24/2017 09:43 ADDENDUM: Please note that aspiration was performed of the left SHOULDER, and not the left hip. EXAM: Left shoulder aspiration under fluoroscopy PROCEDURE: Overlying skin prepped with Betadine, draped in the usual sterile fashion, and  infiltrated locally with buffered lidocaine. Curved 22 gauge spinal needle advanced to the superior medial margin of the left humeral head. 1 mL of lidocaine injected easily. Diagnostic injection of iodinated contrast demonstrate intra-articular spread without intravascular component. Once appropriate positioning was confirmed, aspiration was performed. 1 cc serosanguineous fluid was aspirated and sent for laboratory testing. No immediate complications. IMPRESSION: Technically successful left shoulder aspiration under fluoroscopy guidance. Electronically Signed   By: Fidela Salisbury M.D.   On: 07/24/2017 09:43   Result Date: 07/24/2017 CLINICAL DATA:  Clinical concern for osteomyelitis of the left shoulder. EXAM: LEFT HIP INJECTION UNDER FLUOROSCOPY FLUOROSCOPY TIME:  Fluoroscopy Time:  42 seconds PROCEDURE: Overlying skin prepped with Betadine, draped in the usual sterile fashion, and infiltrated locally with buffered Lidocaine. Curved 22 gauge spinal needle advanced to the superolateral margin of the left femoral head. 1 ml of Lidocaine injected easily. Diagnostic injection of iodinated contrast demonstrates intra-articular spread without intravascular component. Once appropriate positioning was confirmed, aspiration was performed. 1 cc serosanguineous fluid was aspirated and sent for laboratory testing. No immediate complication. IMPRESSION: Technically successful left hip aspiration under fluoroscopy. Electronically Signed: By: Fidela Salisbury M.D. On: 07/23/2017 18:31     Scheduled Meds: . [MAR Hold] feeding supplement (ENSURE ENLIVE)  237 mL Oral BID BM  . [MAR Hold] lidocaine (PF)  5 mL Other Once  . [MAR Hold] nicotine  21 mg Transdermal Daily  . [MAR Hold] sodium chloride  10 mL Intravenous Once  . [MAR Hold] tenofovir  300 mg Oral Q72H   Continuous Infusions: . sodium chloride 125 mL/hr at 07/23/17 0229  . sodium chloride    . [MAR Hold] DAPTOmycin (CUBICIN)  IV Stopped (07/24/17  1140)   Marzetta Board, MD, PhD [facility] Pager [phone]  If 7PM-7AM, please contact night-coverage [email or web address] Password [facility] 07/24/2017, 12:46 PM

## 2017-07-24 NOTE — Progress Notes (Addendum)
    Update: MRI suspicious for septic arthritis.  Discussed w/ Medicine and Dr. Alain Marion.  Arthroscopic I/D planned.  Charna Elizabeth Martensen III, PA-C 07/24/2017 12:40 PM    Subjective: Patient reports pain as moderate, improved.  Denies Fever, chills.  Objective:   VITALS:   Vitals:   07/23/17 1436 07/23/17 1900 07/23/17 2333 07/24/17 0512  BP: 140/81 (!) 162/80 125/65 138/84  Pulse: 61 65 73 81  Resp:   16 18  Temp: (!) 97.3 F (36.3 C) (!) 97.5 F (36.4 C) 97.9 F (36.6 C) 98.1 F (36.7 C)  TempSrc: Oral Oral Oral Oral  SpO2: 100% 100% 100% 100%  Weight:      Height:       CBC Latest Ref Rng & Units 07/24/2017 07/23/2017  WBC 4.0 - 10.5 K/uL 5.3 3.7(L)  Hemoglobin 13.0 - 17.0 g/dL 8.9(L) 7.9(L)  Hematocrit 39.0 - 52.0 % 27.6(L) 24.3(L)  Platelets 150 - 400 K/uL 111(L) 100(L)   BMP Latest Ref Rng & Units 07/24/2017 07/23/2017  Glucose 65 - 99 mg/dL 93 104(H)  BUN 6 - 20 mg/dL 31(H) 31(H)  Creatinine 0.61 - 1.24 mg/dL 2.93(H) 2.88(H)  Sodium 135 - 145 mmol/L 136 138  Potassium 3.5 - 5.1 mmol/L 4.0 4.1  Chloride 101 - 111 mmol/L 109 113(H)  CO2 22 - 32 mmol/L 20(L) 16(L)  Calcium 8.9 - 10.3 mg/dL 8.1(L) 7.8(L)   Intake/Output      05/20 0701 - 05/21 0700 05/21 0701 - 05/22 0700   P.O. 60    I.V. (mL/kg) 1125 (14.6)    IV Piggyback 0    Total Intake(mL/kg) 1185 (15.3)    Urine (mL/kg/hr) 2825 (1.5)    Total Output 2825    Net -1640            Physical Exam: General: NAD.  Supine in bed.  Calm, conversant.  No increased work of breathing. MSK LUE: Left shoulder without erythema lesion or outward sign of infection.  He is unable to ABduct or forward flex the arm.  He has generalized tenderness to palpation about the shoulder.  He has full motion in his hand and elbow.  He is neurovascularly intact distally.  Passive range of motion causes significant discomfort.     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  MRI results pending.  Left shoulder aspiration-serosanguineous fluid did not show any organisms.  Culture pending.  I still do not suspect infection but will await MRI results pending and culture not final.  Follow along for now.  If infection is ruled out he can follow-up outpatient.  I/D if infected.   Prudencio Burly III, PA-C 07/24/2017, 7:38 AM

## 2017-07-25 ENCOUNTER — Inpatient Hospital Stay (HOSPITAL_COMMUNITY): Payer: Medicare Other

## 2017-07-25 ENCOUNTER — Inpatient Hospital Stay (HOSPITAL_COMMUNITY): Payer: Medicare Other | Admitting: Anesthesiology

## 2017-07-25 ENCOUNTER — Encounter (HOSPITAL_COMMUNITY)
Admission: AD | Disposition: A | Discharge status: Home / Self Care | Payer: Self-pay | Source: Other Acute Inpatient Hospital | Attending: Internal Medicine

## 2017-07-25 ENCOUNTER — Encounter (HOSPITAL_COMMUNITY): Payer: Self-pay | Admitting: Orthopedic Surgery

## 2017-07-25 HISTORY — PX: IRRIGATION AND DEBRIDEMENT SHOULDER: SHX5880

## 2017-07-25 LAB — BASIC METABOLIC PANEL
Anion gap: 8 (ref 5–15)
BUN: 35 mg/dL — ABNORMAL HIGH (ref 6–20)
CO2: 19 mmol/L — AB (ref 22–32)
CREATININE: 3.03 mg/dL — AB (ref 0.61–1.24)
Calcium: 8.2 mg/dL — ABNORMAL LOW (ref 8.9–10.3)
Chloride: 114 mmol/L — ABNORMAL HIGH (ref 101–111)
GFR calc non Af Amer: 22 mL/min — ABNORMAL LOW (ref >60)
GFR, EST AFRICAN AMERICAN: 26 mL/min — AB (ref >60)
Glucose, Bld: 91 mg/dL (ref 65–99)
Potassium: 4.5 mmol/L (ref 3.5–5.1)
Sodium: 141 mmol/L (ref 135–145)

## 2017-07-25 LAB — CK: CK TOTAL: 110 U/L (ref 49–397)

## 2017-07-25 LAB — HEPATITIS B DNA, ULTRAQUANTITATIVE, PCR
HBV DNA SERPL PCR-ACNC: 60 IU/mL
HBV DNA SERPL PCR-LOG IU: 1.778 log10 IU/mL

## 2017-07-25 LAB — CBC
HEMATOCRIT: 27.9 % — AB (ref 39.0–52.0)
HEMOGLOBIN: 9.1 g/dL — AB (ref 13.0–17.0)
MCH: 32.4 pg (ref 26.0–34.0)
MCHC: 32.6 g/dL (ref 30.0–36.0)
MCV: 99.3 fL (ref 78.0–100.0)
Platelets: 98 10*3/uL — ABNORMAL LOW (ref 150–400)
RBC: 2.81 MIL/uL — ABNORMAL LOW (ref 4.22–5.81)
RDW: 15.5 % (ref 11.5–15.5)
WBC: 4.9 10*3/uL (ref 4.0–10.5)

## 2017-07-25 LAB — GLUCOSE, CAPILLARY: Glucose-Capillary: 96 mg/dL (ref 65–99)

## 2017-07-25 SURGERY — IRRIGATION AND DEBRIDEMENT SHOULDER
Anesthesia: General | Site: Shoulder | Laterality: Left

## 2017-07-25 MED ORDER — GABAPENTIN 300 MG PO CAPS
ORAL_CAPSULE | ORAL | Status: AC
  Filled 2017-07-25: qty 1

## 2017-07-25 MED ORDER — ACETAMINOPHEN 500 MG PO TABS: 1000.0000 mg | ORAL_TABLET | Freq: Once | ORAL | Status: DC

## 2017-07-25 MED ORDER — PROPOFOL 10 MG/ML IV BOLUS
INTRAVENOUS | Status: DC | PRN
  Administered 2017-07-25: 100 mg via INTRAVENOUS

## 2017-07-25 MED ORDER — ALPRAZOLAM 0.5 MG PO TABS
0.5000 mg | ORAL_TABLET | Freq: Two times a day (BID) | ORAL | Status: DC
  Administered 2017-07-25 – 2017-07-27 (×5): 0.5 mg via ORAL
  Filled 2017-07-25 (×5): qty 1

## 2017-07-25 MED ORDER — GABAPENTIN 300 MG PO CAPS: 300.0000 mg | ORAL_CAPSULE | Freq: Once | ORAL | Status: DC

## 2017-07-25 MED ORDER — OXYCODONE HCL 5 MG PO TABS: 5.0000 mg | ORAL_TABLET | Freq: Once | ORAL | Status: DC | PRN

## 2017-07-25 MED ORDER — HYDROMORPHONE HCL 2 MG/ML IJ SOLN
0.2500 mg | INTRAMUSCULAR | Status: DC | PRN
  Administered 2017-07-25 (×2): 0.5 mg via INTRAVENOUS

## 2017-07-25 MED ORDER — ONDANSETRON HCL 4 MG/2ML IJ SOLN
INTRAMUSCULAR | Status: DC | PRN
  Administered 2017-07-25: 4 mg via INTRAVENOUS

## 2017-07-25 MED ORDER — LIDOCAINE HCL (CARDIAC) PF 100 MG/5ML IV SOSY
PREFILLED_SYRINGE | INTRAVENOUS | Status: DC | PRN
  Administered 2017-07-25: 30 mg via INTRAVENOUS

## 2017-07-25 MED ORDER — HYDROMORPHONE HCL 2 MG/ML IJ SOLN
INTRAMUSCULAR | Status: AC
  Administered 2017-07-25: 0.5 mg via INTRAVENOUS
  Filled 2017-07-25: qty 1

## 2017-07-25 MED ORDER — NEOSTIGMINE METHYLSULFATE 10 MG/10ML IV SOLN
INTRAVENOUS | Status: DC | PRN
  Administered 2017-07-25: 3 mg via INTRAVENOUS

## 2017-07-25 MED ORDER — FENTANYL CITRATE (PF) 250 MCG/5ML IJ SOLN
INTRAMUSCULAR | Status: AC
  Filled 2017-07-25: qty 5

## 2017-07-25 MED ORDER — CEFAZOLIN SODIUM-DEXTROSE 2-4 GM/100ML-% IV SOLN
2.0000 g | INTRAVENOUS | Status: AC
  Administered 2017-07-25: 2 g via INTRAVENOUS

## 2017-07-25 MED ORDER — FENTANYL CITRATE (PF) 100 MCG/2ML IJ SOLN
INTRAMUSCULAR | Status: DC | PRN
  Administered 2017-07-25: 50 ug via INTRAVENOUS
  Administered 2017-07-25: 150 ug via INTRAVENOUS
  Administered 2017-07-25: 50 ug via INTRAVENOUS

## 2017-07-25 MED ORDER — PHENYLEPHRINE HCL 10 MG/ML IJ SOLN
INTRAMUSCULAR | Status: DC | PRN
  Administered 2017-07-25: 100 ug via INTRAVENOUS

## 2017-07-25 MED ORDER — MORPHINE SULFATE (PF) 2 MG/ML IV SOLN
2.0000 mg | INTRAVENOUS | Status: DC | PRN
  Administered 2017-07-25 – 2017-07-28 (×12): 2 mg via INTRAVENOUS
  Filled 2017-07-25 (×12): qty 1

## 2017-07-25 MED ORDER — ROCURONIUM BROMIDE 100 MG/10ML IV SOLN
INTRAVENOUS | Status: DC | PRN
  Administered 2017-07-25: 20 mg via INTRAVENOUS

## 2017-07-25 MED ORDER — PROPOFOL 10 MG/ML IV BOLUS
INTRAVENOUS | Status: AC
  Filled 2017-07-25: qty 40

## 2017-07-25 MED ORDER — MIDAZOLAM HCL 2 MG/2ML IJ SOLN
INTRAMUSCULAR | Status: AC
  Filled 2017-07-25: qty 2

## 2017-07-25 MED ORDER — OXYCODONE HCL 5 MG PO TABS
5.0000 mg | ORAL_TABLET | Freq: Once | ORAL | Status: AC
  Administered 2017-07-25: 5 mg via ORAL

## 2017-07-25 MED ORDER — GABAPENTIN 100 MG PO CAPS
200.0000 mg | ORAL_CAPSULE | Freq: Three times a day (TID) | ORAL | Status: DC
  Administered 2017-07-25 (×2): 200 mg via ORAL
  Filled 2017-07-25 (×3): qty 2

## 2017-07-25 MED ORDER — LACTATED RINGERS IV SOLN: INTRAVENOUS | Status: DC

## 2017-07-25 MED ORDER — SODIUM CHLORIDE 0.9 % IV SOLN
INTRAVENOUS | Status: DC
  Administered 2017-07-25 – 2017-08-03 (×3): via INTRAVENOUS

## 2017-07-25 MED ORDER — ACETAMINOPHEN 500 MG PO TABS
ORAL_TABLET | ORAL | Status: AC
  Filled 2017-07-25: qty 2

## 2017-07-25 MED ORDER — DEXTROSE 5 % IV SOLN
INTRAVENOUS | Status: DC | PRN
  Administered 2017-07-25: 50 ug/min via INTRAVENOUS

## 2017-07-25 MED ORDER — CITALOPRAM HYDROBROMIDE 10 MG PO TABS
10.0000 mg | ORAL_TABLET | Freq: Every day | ORAL | Status: DC
  Administered 2017-07-25 – 2017-08-09 (×16): 10 mg via ORAL
  Filled 2017-07-25 (×16): qty 1

## 2017-07-25 MED ORDER — 0.9 % SODIUM CHLORIDE (POUR BTL) OPTIME
TOPICAL | Status: DC | PRN
  Administered 2017-07-25: 1000 mL

## 2017-07-25 MED ORDER — GLYCOPYRROLATE 0.2 MG/ML IJ SOLN
INTRAMUSCULAR | Status: DC | PRN
  Administered 2017-07-25: 0.4 mg via INTRAVENOUS

## 2017-07-25 MED ORDER — OXYCODONE HCL 5 MG/5ML PO SOLN: 5.0000 mg | Freq: Once | ORAL | Status: DC | PRN

## 2017-07-25 MED ORDER — SODIUM CHLORIDE 0.9 % IV SOLN
620.0000 mg | INTRAVENOUS | Status: DC
  Administered 2017-07-26 – 2017-08-19 (×13): 620 mg via INTRAVENOUS
  Filled 2017-07-25 (×16): qty 12.4

## 2017-07-25 MED ORDER — SODIUM CHLORIDE 0.9 % IR SOLN
Status: DC | PRN
  Administered 2017-07-25: 9000 mL

## 2017-07-25 MED ORDER — CEFAZOLIN SODIUM-DEXTROSE 2-4 GM/100ML-% IV SOLN
INTRAVENOUS | Status: AC
  Filled 2017-07-25: qty 100

## 2017-07-25 SURGICAL SUPPLY — 40 items
BLADE EXCALIBUR 4.0X13 (MISCELLANEOUS) ×2 IMPLANT
BNDG COHESIVE 4X5 TAN STRL (GAUZE/BANDAGES/DRESSINGS) ×2 IMPLANT
CONT SPEC 4OZ CLIKSEAL STRL BL (MISCELLANEOUS) ×4 IMPLANT
COVER MAYO STAND STRL (DRAPES) ×2 IMPLANT
COVER SURGICAL LIGHT HANDLE (MISCELLANEOUS) ×2 IMPLANT
DRAPE ORTHO SPLIT 77X108 STRL (DRAPES) ×3
DRAPE SURG 17X23 STRL (DRAPES) ×2 IMPLANT
DRAPE SURG ORHT 6 SPLT 77X108 (DRAPES) ×3 IMPLANT
DRAPE U-SHAPE 47X51 STRL (DRAPES) ×2 IMPLANT
ELECT REM PT RETURN 9FT ADLT (ELECTROSURGICAL) ×2
ELECTRODE REM PT RTRN 9FT ADLT (ELECTROSURGICAL) ×1 IMPLANT
EVACUATOR 1/8 PVC DRAIN (DRAIN) IMPLANT
FLUID NSS /IRRIG 3000 ML XXX (IV SOLUTION) ×4 IMPLANT
GAUZE SPONGE 4X4 12PLY STRL (GAUZE/BANDAGES/DRESSINGS) ×2 IMPLANT
GAUZE XEROFORM 1X8 LF (GAUZE/BANDAGES/DRESSINGS) ×2 IMPLANT
GLOVE BIOGEL PI IND STRL 8 (GLOVE) ×1 IMPLANT
GLOVE BIOGEL PI INDICATOR 8 (GLOVE) ×1
GLOVE BIOGEL PI ORTHO PRO SZ8 (GLOVE) ×1
GLOVE ECLIPSE 8.0 STRL XLNG CF (GLOVE) ×2 IMPLANT
GLOVE PI ORTHO PRO STRL SZ8 (GLOVE) ×1 IMPLANT
GLOVE SURG ORTHO 8.0 STRL STRW (GLOVE) ×2 IMPLANT
GOWN STRL REUS W/ TWL LRG LVL3 (GOWN DISPOSABLE) ×2 IMPLANT
GOWN STRL REUS W/ TWL XL LVL3 (GOWN DISPOSABLE) ×1 IMPLANT
GOWN STRL REUS W/TWL LRG LVL3 (GOWN DISPOSABLE) ×2
GOWN STRL REUS W/TWL XL LVL3 (GOWN DISPOSABLE) ×1
KIT BEACH CHAIR TRIMANO (MISCELLANEOUS) ×2 IMPLANT
MANIFOLD NEPTUNE II (INSTRUMENTS) ×2 IMPLANT
NS IRRIG 1000ML POUR BTL (IV SOLUTION) ×2 IMPLANT
PACK ORTHO EXTREMITY (CUSTOM PROCEDURE TRAY) ×2 IMPLANT
PAD ABD 8X10 STRL (GAUZE/BANDAGES/DRESSINGS) ×8 IMPLANT
PAD ARMBOARD 7.5X6 YLW CONV (MISCELLANEOUS) ×2 IMPLANT
PORT APPOLLO RF 90DEGREE MULTI (SURGICAL WAND) ×2 IMPLANT
SLING ARM IMMOBILIZER LRG (SOFTGOODS) ×2 IMPLANT
SPONGE LAP 18X18 X RAY DECT (DISPOSABLE) ×2 IMPLANT
SUT ETHILON 3 0 PS 1 (SUTURE) ×4 IMPLANT
TAPE CLOTH SURG 6X10 WHT LF (GAUZE/BANDAGES/DRESSINGS) ×2 IMPLANT
TUBE CONNECTING 12X1/4 (SUCTIONS) ×2 IMPLANT
UNDERPAD 30X30 (UNDERPADS AND DIAPERS) ×2 IMPLANT
WATER STERILE IRR 1000ML POUR (IV SOLUTION) ×2 IMPLANT
YANKAUER SUCT BULB TIP NO VENT (SUCTIONS) ×2 IMPLANT

## 2017-07-25 NOTE — Transfer of Care (Signed)
Immediate Anesthesia Transfer of Care Note  Patient: Glenn Alvarado  Procedure(s) Performed: IRRIGATION AND DEBRIDEMENT SHOULDER (Left Shoulder)  Patient Location: PACU  Anesthesia Type:General  Level of Consciousness: awake and alert   Airway & Oxygen Therapy: Patient Spontanous Breathing and Patient connected to nasal cannula oxygen  Post-op Assessment: Report given to RN and Post -op Vital signs reviewed and stable  Post vital signs: Reviewed and stable  Last Vitals:  Vitals Value Taken Time  BP 130/98 07/25/2017 11:43 AM  Temp    Pulse 111 07/25/2017 11:44 AM  Resp 5 07/25/2017 11:44 AM  SpO2 99 % 07/25/2017 11:44 AM  Vitals shown include unvalidated device data.  Last Pain:  Vitals:   07/25/17 0835  TempSrc:   PainSc: 10-Worst pain ever      Patients Stated Pain Goal: 4 (16/57/90 3833)  Complications: No apparent anesthesia complications

## 2017-07-25 NOTE — Anesthesia Preprocedure Evaluation (Signed)
Anesthesia Evaluation  Patient identified by MRN, date of birth, ID band Patient awake    Reviewed: Allergy & Precautions, NPO status , Patient's Chart, lab work & pertinent test results  Airway Mallampati: II  TM Distance: >3 FB Neck ROM: Full    Dental no notable dental hx. (+) Poor Dentition   Pulmonary COPD, Current Smoker,    breath sounds clear to auscultation       Cardiovascular hypertension, + CAD   Rhythm:Regular Rate:Normal     Neuro/Psych Anxiety Depression negative neurological ROS     GI/Hepatic GERD  ,(+)     substance abuse  methamphetamine use, Hepatitis -, C  Endo/Other  negative endocrine ROS  Renal/GU ARFRenal disease  negative genitourinary   Musculoskeletal negative musculoskeletal ROS (+)   Abdominal   Peds  Hematology  (+) anemia , Thrombocytopenia   Anesthesia Other Findings   Reproductive/Obstetrics                             Anesthesia Physical  Anesthesia Plan  ASA: III  Anesthesia Plan: General   Post-op Pain Management:    Induction: Intravenous  PONV Risk Score and Plan: 3 and Ondansetron and Dexamethasone  Airway Management Planned: Oral ETT  Additional Equipment:   Intra-op Plan:   Post-operative Plan: Extubation in OR  Informed Consent: I have reviewed the patients History and Physical, chart, labs and discussed the procedure including the risks, benefits and alternatives for the proposed anesthesia with the patient or authorized representative who has indicated his/her understanding and acceptance.   Dental advisory given  Plan Discussed with: CRNA  Anesthesia Plan Comments:         Anesthesia Quick Evaluation

## 2017-07-25 NOTE — Anesthesia Postprocedure Evaluation (Signed)
Anesthesia Post Note  Patient: Rain Friedt  Procedure(s) Performed: IRRIGATION AND DEBRIDEMENT SHOULDER (Left Shoulder)     Patient location during evaluation: PACU Anesthesia Type: General Level of consciousness: awake and sedated Pain management: pain level controlled Vital Signs Assessment: post-procedure vital signs reviewed and stable Respiratory status: spontaneous breathing, nonlabored ventilation, respiratory function stable and patient connected to nasal cannula oxygen Cardiovascular status: blood pressure returned to baseline and stable Postop Assessment: no apparent nausea or vomiting Anesthetic complications: no    Last Vitals:  Vitals:   07/25/17 1230 07/25/17 1252  BP:  (!) 142/82  Pulse:  86  Resp:  12  Temp: 36.6 C 36.7 C  SpO2:  99%    Last Pain:  Vitals:   07/25/17 1252  TempSrc: Oral  PainSc:                  Kaliann Coryell,JAMES TERRILL

## 2017-07-25 NOTE — Anesthesia Procedure Notes (Signed)
Procedure Name: Intubation Date/Time: 07/25/2017 10:11 AM Performed by: Eligha Bridegroom, CRNA Pre-anesthesia Checklist: Patient identified, Emergency Drugs available, Suction available, Patient being monitored and Timeout performed Patient Re-evaluated:Patient Re-evaluated prior to induction Oxygen Delivery Method: Circle system utilized Preoxygenation: Pre-oxygenation with 100% oxygen Ventilation: Mask ventilation without difficulty and Oral airway inserted - appropriate to patient size Grade View: Grade I Tube type: Oral Tube size: 7.5 mm Placement Confirmation: ETT inserted through vocal cords under direct vision,  positive ETCO2 and breath sounds checked- equal and bilateral Secured at: 21 cm Tube secured with: Tape Dental Injury: Teeth and Oropharynx as per pre-operative assessment

## 2017-07-25 NOTE — Progress Notes (Signed)
PROGRESS NOTE    Glenn Alvarado  DTO:[id number] DOB: 11-22-1965 DOA: 07/22/2017 PCP: Harvie Junior, MD   Brief Narrative:52 year old male with history of coronary artery disease, hypertension, hyperlipidemia, COPD, depression, anxiety, substance abuse disorder was admitted to [facility] few days ago with acute encephalopathy and questionable suicidal ideation.  In the ED at [facility] he was noted to have leukocytosis, renal failure, anion gap metabolic acidosis, elevated bilirubin and AST as well as worsening anemia from his baseline. Urine drug screen was positive for amphetamines and benzodiazepines.  Following admission patient improved, his encephalopathy resolved.  Due to concern for suicidal ideation psychiatry was consulted and the patient was cleared from psychiatry standpoint.  He incidentally was noted to have 1/4 blood cultures positive for MRSA.  He has also been complaining of worsening left shoulder pain and underwent the plain imaging as well as a CT scan which was concerning for osteomyelitis of the glenoid humeral joint. Orthopedics were consulted and recommended transfer to a tertiary center and he was moved to [facility].  Here orthopedic surgery and ID were consulted.  Underwent an MRI which was positive for septic arthritis and he was taken to the operating room on 5/21  Outside Records Echocardiogram 5/18 CONCLUSIONS  -----------  1. There is normal global left ventricular contractility.  2. Overall left ventricular systolic function is normal with, an EF between 65 - 70 %.  3. The right ventricular systolic function is normal.  4. There is mild aortic valve sclerosis.  5. Mild tricuspid regurgitation present.   Left upper extremity CT 5/19 IMPRESSION:  1. Permeative appearance of the humeral head and glenoid with scattered cortical erosion, most prominently involving the greater tuberosity. Given associated moderate glenohumeral joint effusion, findings  are most concerning for septic arthritis with osteomyelitis. Recommend further evaluation with shoulder aspiration or surgical drainage.   CT head 5/16 No acute intracranial pathology.   Chest x-ray 07/19/17 Ill-defined density seen in right midlung which is slightly smaller compared to prior exam. This may represent focal inflammation or atelectasis, but underlying neoplasm cannot be excluded. Followup PA and lateral chest X-ray is recommended in 3-4 weeks following trial  of antibiotic therapy to ensure resolution and exclude underlying malignancy.   CT abdomen urogram 5/16 Bilateral nonobstructive nephrolithiasis. No hydronephrosis or renal obstruction is noted.  Findings consistent with hepatic cirrhosis.  Stable large solitary gallstone is noted. Gallbladder distention is noted without inflammatory findings.  Microbiology Blood cultures 5/16 - 1/2 MRSA +, sensitive to Bactrim, no data on Clindamycin or Tetracycline Cultures became positive on 5/17   Repeat blood cultures were ordered and patient was started on Vancomycin.   Repeat blood cultures on 5/17 have remained negative and are final.       Assessment & Plan:   Principal Problem:   MRSA bacteremia Active Problems:   CAD (coronary artery disease)   Depression with anxiety   Essential hypertension   GERD (gastroesophageal reflux disease)   HLD (hyperlipidemia)   Substance abuse ([place])   AKI (acute kidney injury) ([place])   Pancytopenia ([place])   Acute metabolic encephalopathy   Septic joint of left shoulder region [facility])   Left shoulder pain   Abnormal LFTs   Tobacco abuse   COPD (chronic obstructive pulmonary disease) ([name])   Chronic viral hepatitis B with cirrhosis ([place])   Hepatitis C antibody test positive  MRSA bacteremia  -Cultures were obtained on admission on 5/16 and on 5/17 showed 1 out of 2 MRSA -Repeat  cultures were ordered on 5/17 and patient was started on vancomycin.  Repeat cultures have remained  negative and they are finalized -He did have a leukocytosis of 18 on admission which improved to 6.7 the next day (not on antibiotics in the first day while hospitalized at [facility] -2D echo as above, no evidence of endocarditis -Patient denies any IV drug use, "he hates needles" -Started on vancomycin and Zosyn here, now on daptomycin per infectious disease  Left shoulder septic arthritis/osteomyelitis -MRI this morning with findings for septic arthritis, discussed with orthopedic surgery, patient will be taken emergently to the operating room on 5/21 -Currently on daptomycin  Acute metabolic encephalopathy -Present during [place] hospitalization, not here.  This completely resolved.  It was thought to be in the setting of questionable toxic overdose with reported suicidal ideation -UDS positive for amphetamines and benzodiazepines, patient is on Xanax which is prescribed by his PCP but denies use of amphetamines, tells me that his daughter is using and he doesn't know how it got into his system  Suicidal/homicidal ideation-seen by psych yesterday recommended Celexa 10 mg daily for depression and anxiety, gabapentin 200 mg 3 times a day for anxiety.  They also recommend to taper her Xanax and then DC Xanax due to history of benzodiazepine dependence.  Acute kidney injury -His creatinine early 2019 was 0.8, creatinine on presentation at [facility] was 3.3, currently at 3.03 -Continue fluids Renal ultrasound -If still high tomorrow we will consult nephrology.  Liver cirrhosis / LFT elevation -With AST greater than ALT there is concern for alcohol use however patient states that he has not had any alcohol in 15 years.  We will continue to monitor.  Bilirubin is normal. -He is followed at [facility] for his liver disease  Pancytopenia -Component of iron deficiency, but most likely in the setting of chronic liver disease -Continue to monitor, stable. Platelets  stable.  Nephrolithiasis -Incidentally found on the CT scan, nonobstructive  Substance abuse disorder -Denies IV drug use, UDS was positive for amphetamines and benzodiazepines.  Gallstones -Asymptomatic, this can be followed up as an outpatient     DVT prophylaxis:scd Code Status:full Family Communication: sister by the bed side Disposition Plan:tbd Consultants: id,ortho  Procedures:none  Antimicrobials: Vancomycin 5/19 >> 5/21  Zosyn 5/19 >> 5/20   Daptomycin 5/21 >>     Subjective: He talks with his eyes closed states he is uncomfortable and in pain in the left shoulder.  He accidentally rolled over on his left shoulder while sleeping.  Requesting for pain medication.   Objective: Vitals:   07/24/17 1446 07/24/17 2107 07/25/17 0454 07/25/17 0947  BP: 121/60 (!) 141/92 (!) 141/94   Pulse: 83 88 83   Resp:  18 17   Temp: 98.1 F (36.7 C) 98.6 F (37 C) 99.5 F (37.5 C)   TempSrc: Oral Oral Oral   SpO2: 100%  100%   Weight:    77.3 kg (170 lb 6.7 oz)  Height:    5' 7.99" (1.727 m)    Intake/Output Summary (Last 24 hours) at 07/25/2017 0959 Last data filed at 07/25/2017 0758 Gross per 24 hour  Intake 175.83 ml  Output 1775 ml  Net -1599.17 ml   Filed Weights   07/23/17 0200 07/25/17 0947  Weight: 77.3 kg (170 lb 6.7 oz) 77.3 kg (170 lb 6.7 oz)    Examination:  General exam: Appears calm and comfortable  Respiratory system: Clear to auscultation. Respiratory effort normal. Cardiovascular system: S1 & S2 heard, RRR.  No JVD, murmurs, rubs, gallops or clicks. No pedal edema. Gastrointestinal system: Abdomen is nondistended, soft and nontender. No organomegaly or masses felt. Normal bowel sounds heard. Central nervous system: Alert and oriented. No focal neurological deficits. Extremities: Tenderness to the left shoulder and decreased range of motion to the left shoulder Skin: No rashes, lesions or ulcers Psychiatry: Judgement and insight appear  normal. Mood & affect appropriate.     Data Reviewed: I have personally reviewed following labs and imaging studies  CBC: Recent Labs  Lab 07/23/17 0419 07/24/17 0437 07/25/17 0426  WBC 3.7* 5.3 4.9  NEUTROABS 2.2  --   --   HGB 7.9* 8.9* 9.1*  HCT 24.3* 27.6* 27.9*  MCV 98.0 99.3 99.3  PLT 100* 111* 98*   Basic Metabolic Panel: Recent Labs  Lab 07/23/17 0419 07/24/17 0437 07/25/17 0426  NA 138 136 141  K 4.1 4.0 4.5  CL 113* 109 114*  CO2 16* 20* 19*  GLUCOSE 104* 93 91  BUN 31* 31* 35*  CREATININE 2.88* 2.93* 3.03*  CALCIUM 7.8* 8.1* 8.2*   GFR: Estimated Creatinine Clearance: 28.2 mL/min (A) (by C-G formula based on SCr of 3.03 mg/dL (H)). Liver Function Tests: Recent Labs  Lab 07/23/17 0419  AST 87*  ALT 43  ALKPHOS 80  BILITOT 0.9  PROT 7.7  ALBUMIN 2.2*   No results for input(s): LIPASE, AMYLASE in the last 168 hours. No results for input(s): AMMONIA in the last 168 hours. Coagulation Profile: Recent Labs  Lab 07/23/17 0419  INR 1.45   Cardiac Enzymes: Recent Labs  Lab 07/25/17 0426  CKTOTAL 110   BNP (last 3 results) No results for input(s): PROBNP in the last 8760 hours. HbA1C: Recent Labs    07/23/17 0621  HGBA1C 4.4*   CBG: Recent Labs  Lab 07/23/17 0742 07/24/17 0808 07/25/17 0739  GLUCAP 88 90 96   Lipid Profile: Recent Labs    07/23/17 0419  CHOL 95  HDL 17*  LDLCALC 65  TRIG 64  CHOLHDL 5.6   Thyroid Function Tests: No results for input(s): TSH, T4TOTAL, FREET4, T3FREE, THYROIDAB in the last 72 hours. Anemia Panel: Recent Labs    07/23/17 0621  VITAMINB12 938*  FOLATE 22.2  FERRITIN 82  TIBC 316  IRON 34*  RETICCTPCT 1.3   Sepsis Labs: No results for input(s): PROCALCITON, LATICACIDVEN in the last 168 hours.  Recent Results (from the past 240 hour(s))  Culture, blood (Routine X 2) w Reflex to ID Panel     Status: None (Preliminary result)   Collection Time: 07/23/17  3:59 AM  Result Value Ref Range  Status   Specimen Description BLOOD RIGHT HAND  Final   Special Requests   Final    BOTTLES DRAWN AEROBIC AND ANAEROBIC Blood Culture results may not be optimal due to an inadequate volume of blood received in culture bottles   Culture   Final    NO GROWTH 1 DAY Performed at [facility], [place] [address] [place], [place] [address]    Report Status PENDING  Incomplete  Culture, blood (Routine X 2) w Reflex to ID Panel     Status: None (Preliminary result)   Collection Time: 07/23/17  4:19 AM  Result Value Ref Range Status   Specimen Description BLOOD LEFT HAND  Final   Special Requests   Final    BOTTLES DRAWN AEROBIC AND ANAEROBIC Blood Culture results may not be optimal due to an inadequate volume of blood received  in culture bottles   Culture   Final    NO GROWTH 1 DAY Performed at [facility], [place] [address] [place], [place] [address]    Report Status PENDING  Incomplete  Body fluid culture     Status: None (Preliminary result)   Collection Time: 07/23/17  6:10 PM  Result Value Ref Range Status   Specimen Description FLUID SYNOVIAL SHOULDER  Final   Special Requests NONE  Final   Gram Stain   Final    FEW WBC PRESENT,BOTH PMN AND MONONUCLEAR NO ORGANISMS SEEN    Culture   Final    NO GROWTH 2 DAYS Performed at [facility], [place] [address] [place], [place] [address]    Report Status PENDING  Incomplete  Surgical pcr screen     Status: Abnormal   Collection Time: 07/24/17 11:47 AM  Result Value Ref Range Status   MRSA, PCR NEGATIVE NEGATIVE Final   Staphylococcus aureus POSITIVE (A) NEGATIVE Final    Comment: (NOTE) The Xpert SA Assay (FDA approved for NASAL specimens in patients 14 years of age and older), is one component of a comprehensive surveillance program. It is not intended to diagnose infection nor to guide or monitor treatment. Performed at [facility], [place] [address] [place], [place] [address]          Radiology  Studies: Mr Shoulder Left Wo Contrast  Result Date: 07/24/2017 CLINICAL DATA:  Left shoulder pain and leukocytosis EXAM: MRI OF THE LEFT SHOULDER WITHOUT CONTRAST TECHNIQUE: Multiplanar, multisequence MR imaging of the shoulder was performed. No intravenous contrast was administered. COMPARISON:  CT scan 07/22/2017 FINDINGS: Diffuse abnormal T1 and T2 signal intensity in the humerus and scapula worrisome for osteomyelitis. Cystic appearing change in the greater tuberosity could be a bone abscess. There is a joint effusion and severe synovitis with findings suspicious for septic arthritis. There is also marked edema like signal abnormality in the supraspinatus, infraspinatus and teres minor muscles worrisome for myofasciitis without definite pyomyositis. Significant rotator cuff tendinopathy/tendinosis. There is also an articular surface tear involving the supraspinatus tendon and interstitial tears involving the infraspinatus tendon. Findings suspicious for septic subacromial/subdeltoid bursitis. IMPRESSION: MR findings highly suspicious for septic arthritis, osteomyelitis and septic bursitis. There is also myofasciitis without definite findings for pyomyositis. These results will be called to the ordering clinician or representative by the Radiologist Assistant, and communication documented in the PACS or zVision Dashboard. Electronically Signed   By: Marijo Sanes M.D.   On: 07/24/2017 08:54   Dg Fluoro Guided Needle Plc Aspiration/injection Loc  Addendum Date: 07/24/2017   ADDENDUM REPORT: 07/24/2017 09:43 ADDENDUM: Please note that aspiration was performed of the left SHOULDER, and not the left hip. EXAM: Left shoulder aspiration under fluoroscopy PROCEDURE: Overlying skin prepped with Betadine, draped in the usual sterile fashion, and infiltrated locally with buffered lidocaine. Curved 22 gauge spinal needle advanced to the superior medial margin of the left humeral head. 1 mL of lidocaine injected  easily. Diagnostic injection of iodinated contrast demonstrate intra-articular spread without intravascular component. Once appropriate positioning was confirmed, aspiration was performed. 1 cc serosanguineous fluid was aspirated and sent for laboratory testing. No immediate complications. IMPRESSION: Technically successful left shoulder aspiration under fluoroscopy guidance. Electronically Signed   By: Fidela Salisbury M.D.   On: 07/24/2017 09:43   Result Date: 07/24/2017 CLINICAL DATA:  Clinical concern for osteomyelitis of the left shoulder. EXAM: LEFT HIP INJECTION UNDER FLUOROSCOPY FLUOROSCOPY TIME:  Fluoroscopy Time:  42 seconds PROCEDURE: Overlying skin prepped with Betadine, draped in the usual sterile fashion, and infiltrated locally with buffered Lidocaine. Curved 22 gauge spinal needle advanced to the superolateral margin of the left femoral head. 1 ml of Lidocaine injected easily. Diagnostic injection of iodinated contrast demonstrates intra-articular spread without intravascular component. Once appropriate positioning was confirmed, aspiration was performed. 1 cc serosanguineous fluid was aspirated and sent for laboratory testing. No immediate complication. IMPRESSION: Technically successful left hip aspiration under fluoroscopy. Electronically Signed: By: Fidela Salisbury M.D. On: 07/23/2017 18:31        Scheduled Meds: . acetaminophen  1,000 mg Oral Once  . [MAR Hold] Chlorhexidine Gluconate Cloth  6 each Topical Q0600  . [MAR Hold] feeding supplement (ENSURE ENLIVE)  237 mL Oral BID BM  . gabapentin  300 mg Oral Once  . [MAR Hold] lidocaine (PF)  5 mL Other Once  . [MAR Hold] mupirocin ointment  1 application Nasal BID  . [MAR Hold] nicotine  21 mg Transdermal Daily  . [MAR Hold] sodium chloride  10 mL Intravenous Once  . [MAR Hold] tenofovir  300 mg Oral Q72H   Continuous Infusions: . sodium chloride 125 mL/hr at 07/25/17 0504  . sodium chloride 10 mL/hr at 07/24/17 1248   . sodium chloride 10 mL/hr at 07/25/17 0948  . ceFAZolin    .  ceFAZolin (ANCEF) IV    . [MAR Hold] DAPTOmycin (CUBICIN)  IV Stopped (07/24/17 1140)  . lactated ringers       LOS: 3 days    Georgette Shell, MD [facility]  If 7PM-7AM, please contact night-coverage [email or web address] Password [facility] 07/25/2017, 9:59 AM

## 2017-07-25 NOTE — Consult Note (Signed)
I was asked to take care of this patient this morning to facilitate timely washout of his septic shoulder.  In short patient has had shoulder symptoms now for an extended period of time and had a 3 week delayed presentation to obtain medical care.  He was scheduled initially for surgery yesterday after an MRI demonstrated a large effusion and concerns for possible reactive changes in the bone versus osteomyelitis.  He had not maintained his n.p.o. status and thus had to be canceled and moved to today.  Due to the OR availability I was asked to help in the patient's care.  He continues to have pain in the shoulder.  We talked about the risk benefits and alternatives of surgery including the long-term risk of degenerative changes about surgery to the joint due to his delayed presentation and septic arthritis.  He is likely going to be precluded from having a total shoulder at any point due to his chronic infection if he does develop arthritic change she has very limited options.  The risks benefits and alternatives were discussed with the patient including but not limited to the risks of nonoperative treatment, versus surgical intervention including infection, bleeding, nerve injury, periprosthetic fracture, the need for revision surgery, leg length discrepancy, gait change, blood clots, cardiopulmonary complications, morbidity, mortality, among others, and they were willing to proceed.    Specific risks including continued infection, the need for further surgery, postoperative pain, and damage to surrounding structures were all discussed the patient understood these issues and elected to proceed.

## 2017-07-25 NOTE — Op Note (Signed)
Orthopaedic Surgery Operative Note (CSN: [id number])  Glenn Alvarado  [birth year] Date of Surgery: 07/25/2017   Diagnoses:  Chronically infected left shoulder  Procedure: Arthroscopic extensive debridement 29823 Arthroscopic synovial biopsy 29805    Operative Finding Successful completion of planned procedure.  Patient had extensive infectious chronic changes throughout the joint and the subacromial space.  He had a cavitary lesion in the glenoid face itself which was debrided sharply and excisionally.  Subscapularis was frayed but was intact.  Biceps had moderate fraying that was debrided though it was unclear if this was from bicipital tendon tissue or from infectious changes.  Additionally the humeral head surface was softened and the cartilage was unhealthy appearing.  There is an articular sided rotator cuff tear that was debrided.  The capsule was injected and there is significant erythema and bleeding consistent with inflammatory changes.  Acromial space demonstrated relatively intact rotator cuff but unhealthy appearing tissue along the bicipital sheath was opened and debrided.  Based on MRI findings we probed the posterior aspect of the tuberosity and found another cavitary type lesion which was debrided excisionally.  Post-operative plan: The patient will be weightbearing as tolerated with a sling for comfort for 1 to 2 days.  The patient will be readmitted to the medicine service with infectious disease recommended for antibiotic management now that surgical debridement was performed.  DVT prophylaxis per primary team.  Pain control with PRN pain medication preferring oral medicines.  Follow up plan will be scheduled in approximately 7 days for incision check.  Post-Op Diagnosis: Same Surgeons:Primary: Hiram Gash, MD Assistants:Brandon Lynnell Jude Location: [facility] OR ROOM 10 Anesthesia: General Antibiotics: Ancef 2g preop Tourniquet time: * No tourniquets in log * Estimated Blood Loss:  minimal Complications: None Specimens: Two for culture, Left shoulder 1 and 2 Implants: * No implants in log *  Indications for Surgery:   Glenn Alvarado is a 52 y.o. male with 3 weeks of pain and infectious concerns in regards to the left shoulder.  I was asked to participate in the patient's care due to his consulting surgeon being unavailable on the day of surgery.  I was made note of the patient for the first time today.    Benefits and risks of operative and nonoperative management were discussed prior to surgery with patient/guardian(s) and informed consent form was completed.  Specific risks including infection, need for additional surgery, continued infection, chronic pain in the shoulder secondary post infectious arthritis, cuff deficiency.   Procedure:   The patient was identified in the preoperative holding area where the surgical site was marked. The patient was taken to the OR where a procedural timeout was called and the above noted anesthesia was induced.  The patient was positioned modified beachchair on Allen table.  Preoperative antibiotics were dosed.  The patient's left shoulder was prepped and draped in the usual sterile fashion.  A second preoperative timeout was called.      A standard posterior viewing portal was made after localizing the portal with a spinal needle.  An anterior accessory portal was also made.  After clearing the articular space the camera was positioned in the subacromial space.  Findings above.  We performed a synovectomy with a shaver and then RF ablator to try and control hemostasis.  Intra-articular space is as described above.  We excisionally debrided a cavitary lesion in the glenoid face.  We then turned our attention to the subacromial space and using a lateral portal localized under spinal needle were  able to perform a complete bursectomy and debride excisionally the area around the bicipital sheath which had unhealthy appearing tissue as  well.  In total we used 9 L of fluid washing out the joint.  The incisions were closed with nylon sutures.  A sterile dressing was placed along with a sling. The patient was awoken from general anesthesia and taken to the PACU in stable condition without complication.   Joya Gaskins, OPA-C, present and scrubbed throughout the case, critical for completion in a timely fashion, and for retraction, instrumentation, closure

## 2017-07-25 NOTE — Progress Notes (Signed)
[place] for Infectious Disease    Date of Admission:  07/22/2017   Total days of antibiotics 4/day 2   ID: Glenn Alvarado is a 52 y.o. 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 He has associated pain. afebrile  Medications:  . ALPRAZolam  0.5 mg Oral BID  . Chlorhexidine Gluconate Cloth  6 each Topical Q0600  . citalopram  10 mg Oral Daily  . feeding supplement (ENSURE ENLIVE)  237 mL Oral BID BM  . gabapentin  200 mg Oral TID  . lidocaine (PF)  5 mL Other Once  . mupirocin ointment  1 application Nasal BID  . nicotine  21 mg Transdermal Daily  . sodium chloride  10 mL Intravenous Once  . tenofovir  300 mg Oral Q72H    Objective: Vital signs in last 24 hours: Temp:  [97.8 F (36.6 C)-99.5 F (37.5 C)] 98.1 F (36.7 C) (05/22 1252) Pulse Rate:  [83-111] 86 (05/22 1252) Resp:  [5-21] 12 (05/22 1252) BP: (130-142)/(82-98) 142/82 (05/22 1252) SpO2:  [99 %-100 %] 99 % (05/22 1252) Weight:  [170 lb 6.7 oz (77.3 kg)] 170 lb 6.7 oz (77.3 kg) (05/22 0947) Physical Exam  Constitutional: He is oriented to person, place, and time. He appears well-developed and well-nourished. No distress.  HENT:  Mouth/Throat: Oropharynx is clear and moist. No oropharyngeal exudate.  Cardiovascular: Normal rate, regular  rhythm and normal heart sounds. Exam reveals no gallop and no friction rub.  No murmur heard.  Pulmonary/Chest: Effort normal and breath sounds normal. No respiratory distress. He has no wheezes.  Abdominal: Soft. Bowel sounds are normal. He exhibits no distension. There is no tenderness.  Ext: left arm in sling. Trace edema LE bilaterally Neurological: He is alert and oriented to person, place, and time.  Skin: Skin is warm and dry. No rash noted. No erythema.  Psychiatric: He has a normal mood and affect. His behavior is normal.     Lab Results Recent Labs    07/24/17 0437 07/25/17 0426  WBC 5.3 4.9  HGB 8.9* 9.1*  HCT 27.6* 27.9*  NA 136 141  K 4.0 4.5  CL 109 114*  CO2 20* 19*  BUN 31* 35*  CREATININE 2.93* 3.03*   Liver Panel Recent Labs    07/23/17 0419  PROT 7.7  ALBUMIN 2.2*  AST 87*  ALT 43  ALKPHOS 80  BILITOT 0.9   Sedimentation Rate Recent Labs    07/23/17 0419  ESRSEDRATE 99*   C-Reactive Protein Recent Labs    07/23/17 0419  CRP 1.6*    Microbiology: 5/22 shoulder specimen PENDING Studies/Results: Mr Shoulder Left Wo Contrast  Result Date: 07/24/2017 CLINICAL DATA:  Left shoulder pain and leukocytosis EXAM: MRI OF THE LEFT SHOULDER WITHOUT CONTRAST TECHNIQUE: Multiplanar, multisequence MR  imaging of the shoulder was performed. No intravenous contrast was administered. COMPARISON:  CT scan 07/22/2017 FINDINGS: Diffuse abnormal T1 and T2 signal intensity in the humerus and scapula worrisome for osteomyelitis. Cystic appearing change in the greater tuberosity could be a bone abscess. There is a joint effusion and severe synovitis with findings suspicious for septic arthritis. There is also marked edema like signal abnormality in the supraspinatus, infraspinatus and teres minor muscles worrisome for myofasciitis without definite pyomyositis. Significant rotator cuff tendinopathy/tendinosis. There is also an articular surface tear involving the  supraspinatus tendon and interstitial tears involving the infraspinatus tendon. Findings suspicious for septic subacromial/subdeltoid bursitis. IMPRESSION: MR findings highly suspicious for septic arthritis, osteomyelitis and septic bursitis. There is also myofasciitis without definite findings for pyomyositis. These results will be called to the ordering clinician or representative by the Radiologist Assistant, and communication documented in the PACS or zVision Dashboard. Electronically Signed   By: Marijo Sanes M.D.   On: 07/24/2017 08:54   Dg Fluoro Guided Needle Plc Aspiration/injection Loc  Addendum Date: 07/24/2017   ADDENDUM REPORT: 07/24/2017 09:43 ADDENDUM: Please note that aspiration was performed of the left SHOULDER, and not the left hip. EXAM: Left shoulder aspiration under fluoroscopy PROCEDURE: Overlying skin prepped with Betadine, draped in the usual sterile fashion, and infiltrated locally with buffered lidocaine. Curved 22 gauge spinal needle advanced to the superior medial margin of the left humeral head. 1 mL of lidocaine injected easily. Diagnostic injection of iodinated contrast demonstrate intra-articular spread without intravascular component. Once appropriate positioning was confirmed, aspiration was performed. 1 cc serosanguineous fluid was aspirated and sent for laboratory testing. No immediate complications. IMPRESSION: Technically successful left shoulder aspiration under fluoroscopy guidance. Electronically Signed   By: Fidela Salisbury M.D.   On: 07/24/2017 09:43   Result Date: 07/24/2017 CLINICAL DATA:  Clinical concern for osteomyelitis of the left shoulder. EXAM: LEFT HIP INJECTION UNDER FLUOROSCOPY FLUOROSCOPY TIME:  Fluoroscopy Time:  42 seconds PROCEDURE: Overlying skin prepped with Betadine, draped in the usual sterile fashion, and infiltrated locally with buffered Lidocaine. Curved 22 gauge spinal needle advanced to the superolateral margin of the left femoral head.  1 ml of Lidocaine injected easily. Diagnostic injection of iodinated contrast demonstrates intra-articular spread without intravascular component. Once appropriate positioning was confirmed, aspiration was performed. 1 cc serosanguineous fluid was aspirated and sent for laboratory testing. No immediate complication. IMPRESSION: Technically successful left hip aspiration under fluoroscopy. Electronically Signed: By: Fidela Salisbury M.D. On: 07/23/2017 18:31     Assessment/Plan: MRSA bacteremia, and probable left septic shoulder = plan on treating for 6 wk with daptomycin 80m/kg, renally dosed. Needs weekly ck  aki = agree with renal ultrasound, avoid nephrotox meds, and adjust meds for renal clearance. Previously normal renal function suspect some of this is due to vancomycin. Await for it to clear his system. Consider getting vanco random level to see if supratherapeutic. Recommend renal consult if worsens.  Pain management = defer to primary team  [facility] Pointfor Infect[facility] Diseases Cell: [phone]: [phone]  07/25/2017, 4:20 PM

## 2017-07-26 ENCOUNTER — Encounter (HOSPITAL_COMMUNITY): Payer: Self-pay | Admitting: Orthopaedic Surgery

## 2017-07-26 LAB — VANCOMYCIN, RANDOM: Vancomycin Rm: 13

## 2017-07-26 LAB — URINALYSIS, ROUTINE W REFLEX MICROSCOPIC
BILIRUBIN URINE: NEGATIVE
GLUCOSE, UA: NEGATIVE mg/dL
KETONES UR: NEGATIVE mg/dL
LEUKOCYTES UA: NEGATIVE
Nitrite: NEGATIVE
PH: 5 (ref 5.0–8.0)
Protein, ur: NEGATIVE mg/dL
Specific Gravity, Urine: 1.003 — ABNORMAL LOW (ref 1.005–1.030)

## 2017-07-26 LAB — BASIC METABOLIC PANEL
Anion gap: 6 (ref 5–15)
BUN: 36 mg/dL — AB (ref 6–20)
CHLORIDE: 111 mmol/L (ref 101–111)
CO2: 20 mmol/L — AB (ref 22–32)
CREATININE: 3.07 mg/dL — AB (ref 0.61–1.24)
Calcium: 7.9 mg/dL — ABNORMAL LOW (ref 8.9–10.3)
GFR calc non Af Amer: 22 mL/min — ABNORMAL LOW (ref >60)
GFR, EST AFRICAN AMERICAN: 26 mL/min — AB (ref >60)
Glucose, Bld: 109 mg/dL — ABNORMAL HIGH (ref 65–99)
Potassium: 4.5 mmol/L (ref 3.5–5.1)
Sodium: 137 mmol/L (ref 135–145)

## 2017-07-26 LAB — CBC WITH DIFFERENTIAL/PLATELET
Abs Immature Granulocytes: 0 10*3/uL (ref 0.0–0.1)
Basophils Absolute: 0 10*3/uL (ref 0.0–0.1)
Basophils Relative: 1 %
EOS ABS: 0.3 10*3/uL (ref 0.0–0.7)
Eosinophils Relative: 5 %
HEMATOCRIT: 22.8 % — AB (ref 39.0–52.0)
Hemoglobin: 7.3 g/dL — ABNORMAL LOW (ref 13.0–17.0)
IMMATURE GRANULOCYTES: 0 %
Lymphocytes Relative: 27 %
Lymphs Abs: 1.5 10*3/uL (ref 0.7–4.0)
MCH: 32 pg (ref 26.0–34.0)
MCHC: 32 g/dL (ref 30.0–36.0)
MCV: 100 fL (ref 78.0–100.0)
MONOS PCT: 15 %
Monocytes Absolute: 0.8 10*3/uL (ref 0.1–1.0)
NEUTROS PCT: 52 %
Neutro Abs: 3 10*3/uL (ref 1.7–7.7)
Platelets: 84 10*3/uL — ABNORMAL LOW (ref 150–400)
RBC: 2.28 MIL/uL — ABNORMAL LOW (ref 4.22–5.81)
RDW: 15.5 % (ref 11.5–15.5)
WBC: 5.6 10*3/uL (ref 4.0–10.5)

## 2017-07-26 LAB — GLUCOSE, CAPILLARY: Glucose-Capillary: 108 mg/dL — ABNORMAL HIGH (ref 65–99)

## 2017-07-26 LAB — SODIUM, URINE, RANDOM: SODIUM UR: 46 mmol/L

## 2017-07-26 LAB — CREATININE, URINE, RANDOM: CREATININE, URINE: 17.47 mg/dL

## 2017-07-26 MED ORDER — ZOLPIDEM TARTRATE 5 MG PO TABS
5.0000 mg | ORAL_TABLET | Freq: Every evening | ORAL | Status: DC | PRN
  Administered 2017-07-27 – 2017-08-18 (×23): 5 mg via ORAL
  Filled 2017-07-26 (×23): qty 1

## 2017-07-26 MED ORDER — NIFEDIPINE ER OSMOTIC RELEASE 30 MG PO TB24
30.0000 mg | ORAL_TABLET | Freq: Every day | ORAL | Status: DC
  Administered 2017-07-26 – 2017-08-04 (×10): 30 mg via ORAL
  Filled 2017-07-26 (×11): qty 1

## 2017-07-26 MED ORDER — DARBEPOETIN ALFA 100 MCG/0.5ML IJ SOSY
100.0000 ug | PREFILLED_SYRINGE | INTRAMUSCULAR | Status: DC
  Administered 2017-07-26 – 2017-08-16 (×4): 100 ug via SUBCUTANEOUS
  Filled 2017-07-26 (×6): qty 0.5
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
## 2017-07-26 NOTE — Plan of Care (Signed)
  Problem: Education: Goal: Knowledge of General Education information will improve Outcome: Progressing   Problem: Clinical Measurements: Goal: Respiratory complications will improve Outcome: Progressing Goal: Cardiovascular complication will be avoided Outcome: Progressing   Problem: Activity: Goal: Risk for activity intolerance will decrease Outcome: Progressing   Problem: Nutrition: Goal: Adequate nutrition will be maintained Outcome: Progressing   Problem: Elimination: Goal: Will not experience complications related to bowel motility Outcome: Progressing Goal: Will not experience complications related to urinary retention Outcome: Progressing

## 2017-07-26 NOTE — Progress Notes (Signed)
[place] for Infectious Disease  Date of Admission:  07/22/2017   Total days of antibiotics 3        Day 1 Daptomycin           Patient ID: Glenn Alvarado is a 52 y.o. 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 ALPRAZolam  0.5 mg Oral BID  . Chlorhexidine Gluconate Cloth  6 each Topical Q0600  . citalopram  10 mg Oral Daily  . darbepoetin (ARANESP) injection - NON-DIALYSIS  100 mcg Subcutaneous Q Thu-1800  . feeding supplement (ENSURE ENLIVE)  237 mL Oral BID BM  . lidocaine (PF)  5 mL Other Once  . mupirocin ointment  1 application Nasal BID  . nicotine  21 mg Transdermal Daily  . NIFEdipine  30 mg Oral Daily  . tenofovir  300 mg Oral Q72H    SUBJECTIVE: Feeling nauseated and vomited earlier this morning. Has a headache and wishes he could sleep better.   Allergies  Allergen Reactions  . Codeine Hives  . Acetaminophen Other (See Comments)    Advised not to take d/t liver disease    OBJECTIVE: Vitals:   07/25/17 2100 07/26/17 0255 07/26/17 0622 07/26/17 0911  BP: (!) 142/81 (!) 143/88 140/86 (!) 153/93  Pulse: 80 83 77 78  Resp: 16 17 16    Temp: (!) 97.5 F (36.4 C) 98.6 F (37 C) 98.6 F (37 C)   TempSrc: Oral Oral Oral   SpO2: 100% 100% 100%   Weight:      Height:       Body mass index is 25.92 kg/m.  Physical Exam  Constitutional:  He is oriented to person, place, and time. He appears well-developed.  Neck: Normal range of motion.  Cardiovascular: Normal rate, regular rhythm and normal heart sounds.  Pulmonary/Chest: Effort normal and breath sounds normal.  Abdominal: Soft.  Musculoskeletal: He exhibits tenderness (left shoulder ).  Keyhole incisions clean and dry.   Neurological: He is alert and oriented to person, place, and time.  Skin: Skin is warm and dry.  Psychiatric: Judgment and thought content normal.   Lab Results Lab Results  Component Value Date   WBC 5.6 07/26/2017   HGB 7.3 (L) 07/26/2017   HCT 22.8 (L) 07/26/2017   MCV 100.0 07/26/2017   PLT 84 (L) 07/26/2017    Lab Results  Component Value Date   CREATININE 3.07 (H) 07/26/2017   BUN 36 (H) 07/26/2017   NA 137 07/26/2017   K 4.5 07/26/2017   CL 111 07/26/2017   CO2 20 (L) 07/26/2017    Lab Results  Component Value Date   ALT 43 07/23/2017   AST 87 (H) 07/23/2017   ALKPHOS 80 07/23/2017   BILITOT 0.9 07/23/2017     Microbiology: BCx MRSA 1/4 bottles @ [place]  BCx 5/19 >>  Pending    ASSESSMENT: 52 y.o. male with MRSA bacteremia (1/4 bottles @ [place]) and acute on chronic left shoulder pain. TTE was negative repeat blood cultures on 5/19 are negative. MR results highly suspicious for septic arthritis, osteomyelitis and septic bursitis. Dr. Griffin Basil took him to the OR on 5/22 for arthroscopic I&D - per note he had a lot of damage from chronic infection that will likely cause long-term dysfunction of his shoulder and not likely to be amenable to surgery. No growth from tissue cultures so far after being on antibiotics for ~5 days prior to. His sister (who also claims to be his POA) called Dr. Baxter Flattery today and informed us that "she would not be able to take him at home to help." He is not a good candidate for home PICC therapy without support to do so with recent shoulder surgery/limited ROM, significant depression/anxiety and questionable  drug use history. Would recommend SNF placement to complete antibiotic course.   Nephrology following for acute kidney injury. Antibiotics changed from vanc to dapto to limit nephrotoxicity. [facility] in April of this year. Prior to admission he was on lamivudine for his [name] NOT tenofovir prior to this hospitalization - this was only started inpatient to prevent flare of [name] with acutely stopping lamivudine.   Cirrhosis with Chronic [name] infection (on [name] at home since March 2019). Hep C Ab (+) with (-) RNA March 2019 indicating cleared infection. INR 1.28 in April this year. He is thrombocytopenic. [name] sAg and cAb IgM (+) --> DNA 60. (DNA of 22,900,000 IU/mL in 02/2017). Placed on Vired 300 mg Q72h while inpatient. Adjust dose for renal recovery.   PLAN: 1. Continue daptomycin 43m/kg - will plan to treat for 4 weeks for septic arthritis 2. Would recommend SNF placement for infusion therapy 3. PICC to be placed once plan in place for antibiotic therapy   Will continue to follow for culture data updates.   SJanene Madeira MSN, NP-C RUpstate Orthopedics Ambulatory Surgery Center LLCfor Infectious DRothsayPager: [phone] 07/26/2017  3:42 PM

## 2017-07-26 NOTE — Evaluation (Signed)
Physical Therapy Evaluation Patient Details Name: Glenn Alvarado MRN: [id number] DOB: Sep 02, 1965 Today's Date: 07/26/2017   History of Present Illness  Glenn Alvarado is a 52yo white male who comes to [facility] on 5/22 after 3 weeks shoulder pain, now presenting s/p Left  GHJ I&D. 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   Clinical Impression  Pt admitted with above diagnosis. Pt currently with functional limitations due to the deficits listed below (see "PT Problem List"). Upon entry, the patient is received seated EOB, Sister Glenn Alvarado is present who adds several clarifying point to patient's history, which per her report is largely confabulation. The pt is awake and agreeable to participate. Pt complaining of some LEE which is visible (acute/insidious problem for him), as well as some dizziness. Noted low H&H this AM (7.3/22.8), BP WNL, noted history of pancytopenia, unclear of any plan for transfusion. Of note and concerningly, the pt endorses a recent history of "blacking out," describing accounts that are consitent with syncope. The pt is alert, pleasant, conversational, and following simple consistently, although most responses are tangential and require frequent redirection. Functional mobility assessment demonstrates poor tolerance to basic mobility, but safe with supervision, no gross LOB in session but subjective lightheadedness and increased effort of AMB. Per sister, the patient is not welcome to any family residence at [place], has none of his own, and largely is "homeless"; she also reports concerns of substance abuse problems with many family members who have previously housed the patient, and explicitly claims his last drug overdose to be the result of purposeful intentions from another family member. Pt will benefit from skilled PT intervention to increase independence and safety with basic mobility in preparation for  discharge to the venue listed below.       Follow Up Recommendations Other (comment)(Pt has not DC location at this time. Unable to make recommendations. )    Equipment Recommendations  Cane    Recommendations for Other Services       Precautions / Restrictions Precautions Precautions: None Required Braces or Orthoses: Sling("For comfort" per ortho note) Restrictions Weight Bearing Restrictions: Yes LUE Weight Bearing: Weight bearing as tolerated      Mobility  Bed Mobility               General bed mobility comments: received sitting EOB   Transfers Overall transfer level: Needs assistance Equipment used: None Transfers: Sit to/from Stand Sit to Stand: Supervision         General transfer comment: safely and slowly, with caution  Ambulation/Gait Ambulation/Gait assistance: Modified independent (Device/Increase time) Ambulation Distance (Feet): 50 Feet Assistive device: None       General Gait Details: very slow, mildly dizzy, BP WNL, but noted low h&H  Stairs            Wheelchair Mobility    Modified Rankin (Stroke Patients Only)       Balance Overall balance assessment: Modified Independent;Mild deficits observed, not formally tested                                           Pertinent Vitals/Pain Pain Assessment: 0-10 Pain Score: 9  Pain Location: left shoulder  Pain Descriptors / Indicators: Aching;Operative site guarding Pain Intervention(s): Limited activity within patient's tolerance    Home Living  Prior Function                 Hand Dominance        Extremity/Trunk Assessment                Communication      Cognition Arousal/Alertness: Awake/alert Behavior During Therapy: WFL for tasks assessed/performed Overall Cognitive Status: History of cognitive impairments - at baseline                                 General Comments: Pt givens  detailed info about DC location, sister's house, mostly factual per sister, but patient is not welcome there, and sister reports he is either not truthful or with AMS, unclear if d/t hepatitis.       General Comments      Exercises     Assessment/Plan    PT Assessment Patient needs continued PT services  PT Problem List Decreased strength;Decreased activity tolerance;Decreased mobility       PT Treatment Interventions Functional mobility training;Balance training;Therapeutic exercise;Therapeutic activities    PT Goals (Current goals can be found in the Care Plan section)  Acute Rehab PT Goals Patient Stated Goal: reduce pain return to home.  PT Goal Formulation: With patient Time For Goal Achievement: 08/09/17 Potential to Achieve Goals: Good    Frequency Min 2X/week   Barriers to discharge Inaccessible home environment;Decreased caregiver support Pt is essentially "homeless" per sister Glenn Alvarado, pt has been living multiple family members over the past few months.     Co-evaluation               AM-PAC PT "6 Clicks" Daily Activity  Outcome Measure Difficulty turning over in bed (including adjusting bedclothes, sheets and blankets)?: A Little Difficulty moving from lying on back to sitting on the side of the bed? : A Little Difficulty sitting down on and standing up from a chair with arms (e.g., wheelchair, bedside commode, etc,.)?: A Little Help needed moving to and from a bed to chair (including a wheelchair)?: A Little Help needed walking in hospital room?: A Little Help needed climbing 3-5 steps with a railing? : A Little 6 Click Score: 18    End of Session Equipment Utilized During Treatment: Other (comment)(Lt sling) Activity Tolerance: Patient tolerated treatment well;Treatment limited secondary to medical complications (Comment)(insidious dizziness) Patient left: in bed;in chair;with family/visitor present   PT Visit Diagnosis: Unsteadiness on feet  (R26.81);Other abnormalities of gait and mobility (R26.89);Difficulty in walking, not elsewhere classified (R26.2)    Time: 1638-4536 PT Time Calculation (min) (ACUTE ONLY): 33 min   Charges:   PT Evaluation $PT Eval High Complexity: 1 High PT Treatments $Therapeutic Activity: 8-22 mins   PT G Codes:       2:20 PM, 08-02-2017 Etta Grandchild, PT, DPT Physical Therapist - [place] [phone] (Pager)  [phone] (Office)     Patti Shorb C Aug 02, 2017, 2:20 PM

## 2017-07-26 NOTE — Progress Notes (Signed)
PROGRESS NOTE    Glenn Alvarado  DDU:[id number] DOB: 1965-07-31 DOA: 07/22/2017 PCP: Harvie Junior, MD  Brief Narrative: 52 year old male with history of coronary artery disease, hypertension, hyperlipidemia, COPD, depression, anxiety, substance abuse disorder was admitted to [facility] few days ago with acute encephalopathy and questionable suicidal ideation. In the ED at [facility] he was noted to have leukocytosis, renal failure, anion gap metabolic acidosis, elevated bilirubin and AST as well as worsening anemia from his baseline. Urine drug screen was positive for amphetamines and benzodiazepines. Following admission patient improved, his encephalopathy resolved. Due to concern for suicidal ideation psychiatry was consulted and the patient was cleared from psychiatry standpoint. He incidentally was noted to have 1/4 blood cultures positive for MRSA. He has also been complaining of worsening left shoulder pain and underwent the plain imaging as well as a CT scan which was concerning for osteomyelitis of the glenoid humeral joint. Orthopedics were consulted and recommended transfer to a tertiary center and he was moved to [facility] orthopedic surgery and ID were consulted. Underwent an MRI which was positive for septic arthritis and he was taken to the operating room on 5/21     Assessment & Plan:   Principal Problem:   MRSA bacteremia Active Problems:   CAD (coronary artery disease)   Depression with anxiety   Essential hypertension   GERD (gastroesophageal reflux disease)   HLD (hyperlipidemia)   Substance abuse ([place])   AKI (acute kidney injury) ([place])   Pancytopenia ([place])   Acute metabolic encephalopathy   Septic joint of left shoulder region [facility])   Left shoulder pain   Abnormal LFTs   Tobacco abuse   COPD (chronic obstructive pulmonary disease) ([name])   Chronic viral hepatitis B with cirrhosis ([place])   Hepatitis C antibody test positive  MRSA  bacteremia -Cultures were obtained on admission on 5/16 and on 5/17 showed 1 out of 2 MRSA -Repeat cultures were ordered on 5/17 and patient was started on vancomycin. Repeat cultures have remained negative and they are finalized -He did have a leukocytosis of 18 on admission which improved to 6.7 the next day (not on antibiotics in the first day while hospitalized at [facility] -2D echo as above, no evidence of endocarditis -Patient denies any IV drug use, "he hates needles" -Started on vancomycin and Zosyn here,now on daptomycin per infectious disease  Left shoulder septic arthritis/osteomyelitis -MRI this morning with findings for septic arthritis, discussed with orthopedic surgery, patient will be taken emergently to the operating room on 5/21 -Currently on daptomycin  Acute metabolic encephalopathy -Present during [place] hospitalization, not here. This completely resolved. It was thought to be in the setting of questionable toxic overdose with reported suicidal ideation -UDS positive for amphetamines and benzodiazepines, patient is on Xanax which is prescribed by his PCP but denies use of amphetamines, tells me that his daughter is usingand he doesn'tknow howitgot intohissystem  Suicidal/homicidal ideation-seen by psych yesterday recommended Celexa 10 mg daily for depression and anxiety, gabapentin 200 mg 3 times a day for anxiety.  They also recommend to taper her Xanax and then DC Xanax due to history of benzodiazepine dependence.  Acute kidney injury -His creatinine early 2019 was 0.8, creatinine on presentation at [facility] was 3.3, currently at 3.07 -Continue fluids Renal ultrasound no obstruction -consult nephrology.  Liver cirrhosis / LFT elevation -With AST greater than ALT there is concern for alcohol use however patient states that he has not had any alcohol in 15 years. We will continue  to monitor. Bilirubin is normal. -He is followed at [facility] for his  liver disease  Pancytopenia -Component of iron deficiency, but most likely in the setting of chronic liver disease -Continue to monitor,stable. Platelets stable.  Nephrolithiasis -Incidentally found on the CT scan, nonobstructive  Substance abuse disorder -Denies IV drug use, UDS was positive for amphetamines and benzodiazepines.  Gallstones -Asymptomatic, this can be followed up as an outpatient       DVT prophylaxisSCD Code StatusFULL Family Communication:NONE Disposition Plan: Patient will need skilled nursing facility at the time of discharge to finish his IV antibiotics daptomycin for 6 weeks. Consultants: Ortho, infectious disease  Procedures: Left shoulder irrigation and debridement 5/22/ 2019 Antimicrobials: Daptomycin  Subjective: Very talkative today, concerned about left shoulder pain concern about 3 children he has.   Objective: Vitals:   07/25/17 2100 07/26/17 0255 07/26/17 0622 07/26/17 0911  BP: (!) 142/81 (!) 143/88 140/86 (!) 153/93  Pulse: 80 83 77 78  Resp: 16 17 16    Temp: (!) 97.5 F (36.4 C) 98.6 F (37 C) 98.6 F (37 C)   TempSrc: Oral Oral Oral   SpO2: 100% 100% 100%   Weight:      Height:        Intake/Output Summary (Last 24 hours) at 07/26/2017 1140 Last data filed at 07/26/2017 0824 Gross per 24 hour  Intake 1160 ml  Output 2175 ml  Net -1015 ml   Filed Weights   07/23/17 0200 07/25/17 0947  Weight: 77.3 kg (170 lb 6.7 oz) 77.3 kg (170 lb 6.7 oz)    Examination:  General exam: Appears calm and comfortable  Respiratory system: Clear to auscultation. Respiratory effort normal. Cardiovascular system: S1 & S2 heard, RRR. No JVD, murmurs, rubs, gallops or clicks. No pedal edema. Gastrointestinal system: Abdomen is nondistended, soft and nontender. No organomegaly or masses felt. Normal bowel sounds heard. Central nervous system: Alert and oriented. No focal neurological deficits. Extremities: Symmetric 5 x 5 power. Skin:  No rashes, lesions or ulcers Psychiatry: Judgement and insight appear normal. Mood & affect appropriate.     Data Reviewed: I have personally reviewed following labs and imaging studies  CBC: Recent Labs  Lab 07/23/17 0419 07/24/17 0437 07/25/17 0426 07/26/17 0506  WBC 3.7* 5.3 4.9 5.6  NEUTROABS 2.2  --   --  3.0  HGB 7.9* 8.9* 9.1* 7.3*  HCT 24.3* 27.6* 27.9* 22.8*  MCV 98.0 99.3 99.3 100.0  PLT 100* 111* 98* 84*   Basic Metabolic Panel: Recent Labs  Lab 07/23/17 0419 07/24/17 0437 07/25/17 0426 07/26/17 0506  NA 138 136 141 137  K 4.1 4.0 4.5 4.5  CL 113* 109 114* 111  CO2 16* 20* 19* 20*  GLUCOSE 104* 93 91 109*  BUN 31* 31* 35* 36*  CREATININE 2.88* 2.93* 3.03* 3.07*  CALCIUM 7.8* 8.1* 8.2* 7.9*   GFR: Estimated Creatinine Clearance: 27.9 mL/min (A) (by C-G formula based on SCr of 3.07 mg/dL (H)). Liver Function Tests: Recent Labs  Lab 07/23/17 0419  AST 87*  ALT 43  ALKPHOS 80  BILITOT 0.9  PROT 7.7  ALBUMIN 2.2*   No results for input(s): LIPASE, AMYLASE in the last 168 hours. No results for input(s): AMMONIA in the last 168 hours. Coagulation Profile: Recent Labs  Lab 07/23/17 0419  INR 1.45   Cardiac Enzymes: Recent Labs  Lab 07/25/17 0426  CKTOTAL 110   BNP (last 3 results) No results for input(s): PROBNP in the last 8760 hours. HbA1C:  No results for input(s): HGBA1C in the last 72 hours. CBG: Recent Labs  Lab 07/23/17 0742 07/24/17 0808 07/25/17 0739 07/26/17 0806  GLUCAP 88 90 96 108*   Lipid Profile: No results for input(s): CHOL, HDL, LDLCALC, TRIG, CHOLHDL, LDLDIRECT in the last 72 hours. Thyroid Function Tests: No results for input(s): TSH, T4TOTAL, FREET4, T3FREE, THYROIDAB in the last 72 hours. Anemia Panel: No results for input(s): VITAMINB12, FOLATE, FERRITIN, TIBC, IRON, RETICCTPCT in the last 72 hours. Sepsis Labs: No results for input(s): PROCALCITON, LATICACIDVEN in the last 168 hours.  Recent Results (from  the past 240 hour(s))  Culture, blood (Routine X 2) w Reflex to ID Panel     Status: None (Preliminary result)   Collection Time: 07/23/17  3:59 AM  Result Value Ref Range Status   Specimen Description BLOOD RIGHT HAND  Final   Special Requests   Final    BOTTLES DRAWN AEROBIC AND ANAEROBIC Blood Culture results may not be optimal due to an inadequate volume of blood received in culture bottles   Culture   Final    NO GROWTH 2 DAYS Performed at [facility] [address], [place], [place]  [address]    Report Status PENDING  Incomplete  Culture, blood (Routine X 2) w Reflex to ID Panel     Status: None (Preliminary result)   Collection Time: 07/23/17  4:19 AM  Result Value Ref Range Status   Specimen Description BLOOD LEFT HAND  Final   Special Requests   Final    BOTTLES DRAWN AEROBIC AND ANAEROBIC Blood Culture results may not be optimal due to an inadequate volume of blood received in culture bottles   Culture   Final    NO GROWTH 2 DAYS Performed at [facility], [place] [address] [place], [place] [address]    Report Status PENDING  Incomplete  Body fluid culture     Status: None (Preliminary result)   Collection Time: 07/23/17  6:10 PM  Result Value Ref Range Status   Specimen Description FLUID SYNOVIAL SHOULDER  Final   Special Requests NONE  Final   Gram Stain   Final    FEW WBC PRESENT,BOTH PMN AND MONONUCLEAR NO ORGANISMS SEEN    Culture   Final    NO GROWTH 3 DAYS Performed at [facility], [place] [address], [place], [place] [address]    Report Status PENDING  Incomplete  Surgical pcr screen     Status: Abnormal   Collection Time: 07/24/17 11:47 AM  Result Value Ref Range Status   MRSA, PCR NEGATIVE NEGATIVE Final   Staphylococcus aureus POSITIVE (A) NEGATIVE Final    Comment: (NOTE) The Xpert SA Assay (FDA approved for NASAL specimens in patients 73 years of age and older), is one component of a comprehensive surveillance program. It is not  intended to diagnose infection nor to guide or monitor treatment. Performed at [facility], [place] [address], [place],  [address]   Aerobic/Anaerobic Culture (surgical/deep wound)     Status: None (Preliminary result)   Collection Time: 07/25/17 11:03 AM  Result Value Ref Range Status   Specimen Description TISSUE LEFT SHOULDER SPEC 1  Final   Special Requests PATIENT ON FOLLOWING ANCEF  Final   Gram Stain   Final    FEW WBC PRESENT, PREDOMINANTLY PMN NO ORGANISMS SEEN Performed at [facility],  [address], [place],  [address]    Culture PENDING  Incomplete  Report Status PENDING  Incomplete  Aerobic/Anaerobic Culture (surgical/deep wound)     Status: None (Preliminary result)   Collection Time: 07/25/17 11:05 AM  Result Value Ref Range Status   Specimen Description TISSUE LEFT SHOULDER SPEC 2  Final   Special Requests PATIENT ON FOLLOWING ANCEF  Final   Gram Stain   Final    FEW WBC PRESENT, PREDOMINANTLY PMN NO ORGANISMS SEEN Performed at [facility], [address], [place], [place] [address]    Culture PENDING  Incomplete   Report Status PENDING  Incomplete         Radiology Studies: US Renal  Result Date: 07/25/2017 CLINICAL DATA:  Elevated creatinine EXAM: RENAL / URINARY TRACT ULTRASOUND COMPLETE COMPARISON:  CT 07/19/2017 FINDINGS: Right Kidney: Length: 13.8 cm.  Increased echotexture.  No mass or hydronephrosis. Left Kidney: Length: 12.9 cm.  Increased echotexture.  No mass or hydronephrosis. Bladder: Appears normal for degree of bladder distention. IMPRESSION: Increased echotexture within the kidneys bilaterally suggesting chronic medical renal disease. Electronically Signed   By: Rolm Baptise M.D.   On: 07/25/2017 18:28        Scheduled Meds: . ALPRAZolam  0.5 mg Oral BID  . Chlorhexidine Gluconate Cloth  6 each Topical Q0600  . citalopram  10 mg Oral Daily  . feeding supplement (ENSURE ENLIVE)  237 mL Oral BID BM  .  gabapentin  200 mg Oral TID  . lidocaine (PF)  5 mL Other Once  . mupirocin ointment  1 application Nasal BID  . nicotine  21 mg Transdermal Daily  . tenofovir  300 mg Oral Q72H   Continuous Infusions: . sodium chloride 125 mL/hr at 07/25/17 0504  . sodium chloride 10 mL/hr at 07/24/17 1248  . sodium chloride 10 mL/hr at 07/25/17 1830  . DAPTOmycin (CUBICIN)  IV       LOS: 4 days     Georgette Shell, MD If 7PM-7AM, please contact night-coverage [email or web address] Password [email or web address] 07/26/2017, 11:40 AM

## 2017-07-26 NOTE — Progress Notes (Signed)
7517 pt's sister Lonie Peak is POA and would like to be consulted on treatment and discharge. Concerned about pt going home with narcotics, would prefer a rehab facility immed. after surgery. Will discuss with physicians as she will be staying here for a day or two with pt.

## 2017-07-26 NOTE — Progress Notes (Addendum)
ORTHOPAEDIC PROGRESS NOTE  s/p Procedure(s): IRRIGATION AND DEBRIDEMENT SHOULDER  SUBJECTIVE: Reports mild pain about operative site. No chest pain. No SOB. No nausea/vomiting. No other complaints.  OBJECTIVE: PE: Left upper extremity: Distal motor and sensory intact, patient has pain with attempted range of motion,  Vitals:   07/26/17 0622 07/26/17 0911  BP: 140/86 (!) 153/93  Pulse: 77 78  Resp: 16   Temp: 98.6 F (37 C)   SpO2: 100%      ASSESSMENT: Glenn Alvarado is a 52 y.o. male doing well postoperatively.  We discussed with the patient and his sister his long-term outlook.  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  He is not going to be a candidate for arthroscopy and cuff repair or for arthroplasty due to his infection.  His bone quality was poor.  We will order therapy to help with patient moving left shoulder as tolerated.  PLAN: Weightbearing: WBAT LUE Insicional and dressing care: OK to remove dressings POD2 and leave open to air with dry gauze PRN Orthopedic device(s): Sling to continue postop day 2 Showering: As tolerated after postop day 2 VTE prophylaxis: Not indicated in ambulatory upper extremity patient from orthopedic perspective, defer to primary team Pain control: Per primary team but would minimize narcotics, adjuvant therapies as appropriate.  Patient should not require more than 2 to 3 days of narcotics after the type of surgery the head pain is primarily from his underlying issue. Follow - up plan: 2 weeks for wound check and suture removal, if at facility sutures can be removed there. Contact information:  Weekdays 8-5 Ophelia Charter MD [phone], After hours and holidays please check [email or web address] for group call information for [facility]  From an orthopedic perspective patient the surgery the patient had was  otherwise outpatient.  His hospitalization is per his medical team for optimization of his antibiotics.  We are available if needed otherwise he can follow-up as above in 2 weeks.

## 2017-07-27 LAB — CBC WITH DIFFERENTIAL/PLATELET
Abs Immature Granulocytes: 0 10*3/uL (ref 0.0–0.1)
BASOS ABS: 0 10*3/uL (ref 0.0–0.1)
Basophils Relative: 1 %
EOS PCT: 5 %
Eosinophils Absolute: 0.3 10*3/uL (ref 0.0–0.7)
HEMATOCRIT: 23.1 % — AB (ref 39.0–52.0)
HEMOGLOBIN: 7.6 g/dL — AB (ref 13.0–17.0)
Immature Granulocytes: 0 %
LYMPHS PCT: 24 %
Lymphs Abs: 1.5 10*3/uL (ref 0.7–4.0)
MCH: 32.2 pg (ref 26.0–34.0)
MCHC: 32.9 g/dL (ref 30.0–36.0)
MCV: 97.9 fL (ref 78.0–100.0)
Monocytes Absolute: 0.7 10*3/uL (ref 0.1–1.0)
Monocytes Relative: 12 %
NEUTROS PCT: 58 %
Neutro Abs: 3.6 10*3/uL (ref 1.7–7.7)
Platelets: 108 10*3/uL — ABNORMAL LOW (ref 150–400)
RBC: 2.36 MIL/uL — AB (ref 4.22–5.81)
RDW: 15.4 % (ref 11.5–15.5)
WBC: 6.3 10*3/uL (ref 4.0–10.5)

## 2017-07-27 LAB — C3 COMPLEMENT: C3 Complement: 134 mg/dL (ref 82–167)

## 2017-07-27 LAB — BASIC METABOLIC PANEL
ANION GAP: 8 (ref 5–15)
BUN: 41 mg/dL — ABNORMAL HIGH (ref 6–20)
CO2: 21 mmol/L — ABNORMAL LOW (ref 22–32)
Calcium: 8.3 mg/dL — ABNORMAL LOW (ref 8.9–10.3)
Chloride: 111 mmol/L (ref 101–111)
Creatinine, Ser: 3.34 mg/dL — ABNORMAL HIGH (ref 0.61–1.24)
GFR, EST AFRICAN AMERICAN: 23 mL/min — AB (ref >60)
GFR, EST NON AFRICAN AMERICAN: 20 mL/min — AB (ref >60)
Glucose, Bld: 118 mg/dL — ABNORMAL HIGH (ref 65–99)
POTASSIUM: 5.1 mmol/L (ref 3.5–5.1)
Sodium: 140 mmol/L (ref 135–145)

## 2017-07-27 LAB — C4 COMPLEMENT: COMPLEMENT C4, BODY FLUID: 15 mg/dL (ref 14–44)

## 2017-07-27 LAB — BODY FLUID CULTURE: CULTURE: NO GROWTH

## 2017-07-27 MED ORDER — ALPRAZOLAM 0.5 MG PO TABS
0.5000 mg | ORAL_TABLET | Freq: Every day | ORAL | Status: DC
  Administered 2017-07-28 – 2017-08-09 (×13): 0.5 mg via ORAL
  Filled 2017-07-27 (×13): qty 1

## 2017-07-27 NOTE — Progress Notes (Signed)
Nutrition Follow-up  DOCUMENTATION CODES:   Not applicable  INTERVENTION:  Continue Ensure Enlive po BID, each supplement provides 350 kcal and 20 grams of protein.  Encourage adequate PO intake.   NUTRITION DIAGNOSIS:   Increased nutrient needs related to chronic illness(COPD) as evidenced by estimated needs; ongoing  GOAL:   Patient will meet greater than or equal to 90% of their needs; met  MONITOR:   Supplement acceptance, Diet advancement, Labs, Weight trends  REASON FOR ASSESSMENT:   Malnutrition Screening Tool    ASSESSMENT:   52 year old male with PMH significant for hypertension, hyperlipidemia, COPD, GERD, depression, anxiety, CAD, substance abuse, and tobacco abuse. Pt presented to [facility] on 07/19/17 with AMS likely due to drug abuse and was transferred to [facility] for evaluation and treatment of left shoulder pain.   Procedure (5/22): Arthroscopic extensive debridement  Arthroscopic synovial biopsy  Meal completion has been 100%. Pt reports having a good appetite. Pt currently has Ensure ordered and has been consuming them. RD to continue with current orders. Pt plans on continuing Ensure post discharge at home to aid in adequate nutrition and healing.   Diet Order:   Diet Order           Diet regular Room service appropriate? Yes; Fluid consistency: Thin  Diet effective now          EDUCATION NEEDS:   No education needs have been identified at this time  Skin:  Skin Assessment: Skin Integrity Issues: Skin Integrity Issues:: Incisions Incisions: L shoulder  Last BM:  5/23  Height:   Ht Readings from Last 1 Encounters:  07/25/17 5' 7.99" (1.727 m)    Weight:   Wt Readings from Last 1 Encounters:  07/25/17 170 lb 6.7 oz (77.3 kg)    Ideal Body Weight:  70 kg  BMI:  Body mass index is 25.92 kg/m.  Estimated Nutritional Needs:   Kcal:  2100-2300 kcal/day  Protein:  95-110 grams/day  Fluid:  2.1-2.3 L/day    Corrin Parker, MS, RD, LDN Pager # [phone] After hours/ weekend pager # [phone]

## 2017-07-27 NOTE — Progress Notes (Signed)
[facility] for Infectious Disease  Date of Admission:  07/22/2017   Total days of antibiotics 4       Day 4  Daptomycin           Patient ID: Glenn Alvarado is a 52 y.o. 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 [START ON 07/28/2017] ALPRAZolam  0.5 mg Oral Daily  . Chlorhexidine Gluconate Cloth  6 each Topical Q0600  . citalopram  10 mg Oral Daily  . darbepoetin (ARANESP) injection - NON-DIALYSIS  100 mcg Subcutaneous Q Thu-1800  . feeding supplement (ENSURE ENLIVE)  237 mL Oral BID BM  . lidocaine (PF)  5 mL Other Once  . mupirocin ointment  1 application Nasal BID  . nicotine  21 mg Transdermal Daily  . NIFEdipine  30 mg Oral Daily  . tenofovir  300 mg Oral Q72H    SUBJECTIVE: Afebrile, having less shoulder pain,concerned about lower extremity swelling  OR cx from 5/22 ngtd, Cr continues to climb to 3.34  Allergies  Allergen Reactions  . Codeine Hives  . Acetaminophen Other (See Comments)    Advised not to take d/t liver disease    OBJECTIVE: Vitals:   07/26/17 0911 07/26/17 1400 07/26/17 2140 07/27/17 0508  BP: (!) 153/93 138/84 125/75 102/60  Pulse: 78 87 81 78  Resp:    16  Temp:  98.4 F (36.9 C) 98.2 F (36.8 C) 98.4 F (36.9 C)  TempSrc:  Oral Oral Oral  SpO2:  99% 96% 96%  Weight:      Height:       Body mass index is 25.92  kg/m.  Physical Exam  Constitutional: He is oriented to person, place, and time. He appears well-developed.  Neck: Normal range of motion.  Cardiovascular: Normal rate, regular rhythm and normal heart sounds.  Pulmonary/Chest: Effort normal and breath sounds normal.  Abdominal: Soft.  Musculoskeletal: He exhibits tenderness (left shoulder ).  Keyhole incisions clean and dry.   Neurological: He is alert and oriented to person, place, and time.  Skin: Skin is warm and dry.  Psychiatric: Judgment and thought content normal.   Lab Results Lab Results  Component Value Date   WBC 6.3 07/27/2017   HGB 7.6 (L) 07/27/2017   HCT 23.1 (L) 07/27/2017   MCV 97.9 07/27/2017   PLT 108 (L) 07/27/2017    Lab Results  Component Value Date   CREATININE 3.34 (H) 07/27/2017   BUN 41 (H) 07/27/2017   NA 140 07/27/2017   K 5.1 07/27/2017   CL 111 07/27/2017   CO2 21 (L) 07/27/2017    Lab Results  Component Value Date   ALT 43 07/23/2017   AST 87 (H) 07/23/2017   ALKPHOS 80 07/23/2017   BILITOT 0.9 07/23/2017     Microbiology: BCx MRSA 1/4 bottles @  [name]  BCx 5/19 >>NGTD Or Cx 5/22 >> NGTD at 48hr   ASSESSMENT: 52 y.o. male with MRSA bacteremia (1/4 bottles @ [name]) and acute on chronic left shoulder pain. TTE was negative repeat blood cultures on 5/19 are negative. MR results highly suspicious for septic arthritis, osteomyelitis and septic bursitis. Dr. Griffin Basil took him to the OR on 5/22 for arthroscopic I&D - per note he had a lot of damage from chronic infection that will likely cause long-term dysfunction of his shoulder and not likely to be amenable to surgery. No growth from tissue cultures so far after being on antibiotics for ~5 days prior to.   Nephrology following for acute kidney injury. Currently on  dapto to limit nephrotoxicity. His baseline creatinine 0.78 at [name] in April of this year. Prior to admission he was on lamivudine for his [name] NOT tenofovir prior to this  hospitalization - this was only started inpatient to prevent flare of [name] with acutely stopping lamivudine.   Cirrhosis with Chronic [name] infection (on [name] at home since March 2019). Hep C Ab (+) with (-) RNA March 2019 indicating cleared infection. INR 1.28 in April this year. He is thrombocytopenic. [name] sAg and cAb IgM (+) --> DNA 60. (DNA of 22,900,000 IU/mL in 02/2017). Placed on Vired 300 mg Q72h -renally dosed for now   PLAN: 1. Continue daptomycin 67m/kg - will plan to treat for 4 weeks for septic arthritis through June 20th  2. Would recommend SNF placement for infusion therapy  3. aki = has not hit nadir, await to see that it improves spontaneously, minimize any nephrotox drugs 4. Lower extremity edema= likely from changes in his renal function 5. Defer PICC vs. Tunneled catheter until can figure out what his discharge plans will be.  Dr. VTommy Medalis available over the weekend for ID questions @ [phone]. We will see back on Monday.   CElzie RingsSVernonfor Infectious Diseases [phone]   07/27/2017  10:48 AM

## 2017-07-27 NOTE — Progress Notes (Signed)
PROGRESS NOTE    Glenn Alvarado  MOQ:[id number] DOB: 06-29-1965 DOA: 07/22/2017 PCP: Harvie Junior, MD   Brief Narrative:52 year old male with history of coronary artery disease, hypertension, hyperlipidemia, COPD, depression, anxiety, substance abuse disorder was admitted to [facility] few days ago with acute encephalopathy and questionable suicidal ideation. In the ED at [facility] he was noted to have leukocytosis, renal failure, anion gap metabolic acidosis, elevated bilirubin and AST as well as worsening anemia from his baseline. Urine drug screen was positive for amphetamines and benzodiazepines. Following admission patient improved, his encephalopathy resolved. Due to concern for suicidal ideation psychiatry was consulted and the patient was cleared from psychiatry standpoint. He incidentally was noted to have 1/4 blood cultures positive for MRSA. He has also been complaining of worsening left shoulder pain and underwent the plain imaging as well as a CT scan which was concerning for osteomyelitis of the glenoid humeral joint. Orthopedics were consulted and recommended transfer to a tertiary center and he was moved to [facility] orthopedic surgery and ID were consulted. Underwent an MRI which was positive for septic arthritis and he was taken to the operating room on 5/21    Assessment & Plan:   Principal Problem:   MRSA bacteremia Active Problems:   CAD (coronary artery disease)   Depression with anxiety   Essential hypertension   GERD (gastroesophageal reflux disease)   HLD (hyperlipidemia)   Substance abuse ([place])   AKI (acute kidney injury) ([place])   Pancytopenia ([place])   Acute metabolic encephalopathy   Septic joint of left shoulder region [facility])   Left shoulder pain   Abnormal LFTs   Tobacco abuse   COPD (chronic obstructive pulmonary disease) ([name])   Chronic viral hepatitis B with cirrhosis ([place])   Hepatitis C antibody test positive  MRSA  bacteremia -Cultures were obtained on admission on 5/16 and on 5/17 showed 1 out of 2 MRSA -Repeat cultures were ordered on 5/17 and patient was started on vancomycin. Repeat cultures have remained negative and they are finalized -He did have a leukocytosis of 18 on admission which improved to 6.7 the next day (not on antibiotics in the first day while hospitalized at [facility] -2D echo as above, no evidence of endocarditis -Patient denies any IV drug use, "he hates needles" -Started on vancomycin and Zosyn here,now on daptomycin per infectious disease  Left shoulder septic arthritis/osteomyelitis status post shoulder irrigation and debridement 07/25/2017 -MRI this morning with findings for septic arthritis, discussed with orthopedic surgery, patient will be taken emergently to the operating room on 5/21 -Currently on daptomycin -We will obtain PICC line for long-term IV antibiotics upon discharge  Acute metabolic encephalopathy -Present during [place] hospitalization, not here. This completely resolved. It was thought to be in the setting of questionable toxic overdose with reported suicidal ideation -UDS positive for amphetamines and benzodiazepines, patient is on Xanax which is prescribed by his PCP but denies use of amphetamines, tells me that his daughter is usingand he doesn'tknow howitgot intohissystem  Suicidal/homicidal ideation-seen by psych yesterday recommended Celexa 10 mg daily for depression and anxiety, gabapentin 200 mg 3 times a day for anxiety. They also recommend to taper her Xanax and then DC Xanax due to history of benzodiazepine dependence.  Acute kidney injury -His creatinine early 2019 was 0.8, creatinine on presentation at [facility] was 3.3, now increasing. -Continue fluids Renal ultrasound no obstruction -Appreciate nephrology input.  Liver cirrhosis / LFT elevation -With AST greater than ALT there is concern for alcohol use  however patient states  that he has not had any alcohol in 15 years. We will continue to monitor. Bilirubin is normal. -He is followed at [facility] for his liver disease  Pancytopenia -Component of iron deficiency, but most likely in the setting of chronic liver disease -Continue to monitor,stable. Platelets stable.  Nephrolithiasis -Incidentally found on the CT scan, nonobstructive  Substance abuse disorder -Denies IV drug use, UDS was positive for amphetamines and benzodiazepines.  Gallstones -Asymptomatic, this can be followed up as an outpatient       DVT prophylaxis:scd Code Status full Family Communication none Disposition Plan:will need picc for IV antibiotics upon discharge.  Will place consult case management for skilled nursing facility placement. Consultants: Infectious disease, Ortho  Procedures: Left shoulder irrigation debridement 07/25/2017 Antimicrobials daptomycin  Subjective: Very concerned about why Xanax dose has been decreased, asking for more narcotics for pain   Objective: Vitals:   07/26/17 0911 07/26/17 1400 07/26/17 2140 07/27/17 0508  BP: (!) 153/93 138/84 125/75 102/60  Pulse: 78 87 81 78  Resp:    16  Temp:  98.4 F (36.9 C) 98.2 F (36.8 C) 98.4 F (36.9 C)  TempSrc:  Oral Oral Oral  SpO2:  99% 96% 96%  Weight:      Height:        Intake/Output Summary (Last 24 hours) at 07/27/2017 0959 Last data filed at 07/27/2017 0900 Gross per 24 hour  Intake 1813.57 ml  Output 3150 ml  Net -1336.43 ml   Filed Weights   07/23/17 0200 07/25/17 0947  Weight: 77.3 kg (170 lb 6.7 oz) 77.3 kg (170 lb 6.7 oz)    Examination:  General exam: Appears calm and comfortable  Respiratory system: Clear to auscultation. Respiratory effort normal. Cardiovascular system: S1 & S2 heard, RRR. No JVD, murmurs, rubs, gallops or clicks. No pedal edema. Gastrointestinal system: Abdomen is nondistended, soft and nontender. No organomegaly or masses felt. Normal bowel sounds  heard. Central nervous system: Alert and oriented. No focal neurological deficits. Extremities: Symmetric 5 x 5 power. Skin: No rashes, lesions or ulcers Psychiatry: Judgement and insight appear normal. Mood & affect appropriate.     Data Reviewed: I have personally reviewed following labs and imaging studies  CBC: Recent Labs  Lab 07/23/17 0419 07/24/17 0437 07/25/17 0426 07/26/17 0506 07/27/17 0520  WBC 3.7* 5.3 4.9 5.6 6.3  NEUTROABS 2.2  --   --  3.0 3.6  HGB 7.9* 8.9* 9.1* 7.3* 7.6*  HCT 24.3* 27.6* 27.9* 22.8* 23.1*  MCV 98.0 99.3 99.3 100.0 97.9  PLT 100* 111* 98* 84* 213*   Basic Metabolic Panel: Recent Labs  Lab 07/23/17 0419 07/24/17 0437 07/25/17 0426 07/26/17 0506 07/27/17 0520  NA 138 136 141 137 140  K 4.1 4.0 4.5 4.5 5.1  CL 113* 109 114* 111 111  CO2 16* 20* 19* 20* 21*  GLUCOSE 104* 93 91 109* 118*  BUN 31* 31* 35* 36* 41*  CREATININE 2.88* 2.93* 3.03* 3.07* 3.34*  CALCIUM 7.8* 8.1* 8.2* 7.9* 8.3*   GFR: Estimated Creatinine Clearance: 25.6 mL/min (A) (by C-G formula based on SCr of 3.34 mg/dL (H)). Liver Function Tests: Recent Labs  Lab 07/23/17 0419  AST 87*  ALT 43  ALKPHOS 80  BILITOT 0.9  PROT 7.7  ALBUMIN 2.2*   No results for input(s): LIPASE, AMYLASE in the last 168 hours. No results for input(s): AMMONIA in the last 168 hours. Coagulation Profile: Recent Labs  Lab 07/23/17 0419  INR 1.45  Cardiac Enzymes: Recent Labs  Lab 07/25/17 0426  CKTOTAL 110   BNP (last 3 results) No results for input(s): PROBNP in the last 8760 hours. HbA1C: No results for input(s): HGBA1C in the last 72 hours. CBG: Recent Labs  Lab 07/23/17 0742 07/24/17 0808 07/25/17 0739 07/26/17 0806  GLUCAP 88 90 96 108*   Lipid Profile: No results for input(s): CHOL, HDL, LDLCALC, TRIG, CHOLHDL, LDLDIRECT in the last 72 hours. Thyroid Function Tests: No results for input(s): TSH, T4TOTAL, FREET4, T3FREE, THYROIDAB in the last 72  hours. Anemia Panel: No results for input(s): VITAMINB12, FOLATE, FERRITIN, TIBC, IRON, RETICCTPCT in the last 72 hours. Sepsis Labs: No results for input(s): PROCALCITON, LATICACIDVEN in the last 168 hours.  Recent Results (from the past 240 hour(s))  Culture, blood (Routine X 2) w Reflex to ID Panel     Status: None (Preliminary result)   Collection Time: 07/23/17  3:59 AM  Result Value Ref Range Status   Specimen Description BLOOD RIGHT HAND  Final   Special Requests   Final    BOTTLES DRAWN AEROBIC AND ANAEROBIC Blood Culture results may not be optimal due to an inadequate volume of blood received in culture bottles   Culture   Final    NO GROWTH 3 DAYS Performed at [facility], [place] [address], [place], [place] [address]    Report Status PENDING  Incomplete  Culture, blood (Routine X 2) w Reflex to ID Panel     Status: None (Preliminary result)   Collection Time: 07/23/17  4:19 AM  Result Value Ref Range Status   Specimen Description BLOOD LEFT HAND  Final   Special Requests   Final    BOTTLES DRAWN AEROBIC AND ANAEROBIC Blood Culture results may not be optimal due to an inadequate volume of blood received in culture bottles   Culture   Final    NO GROWTH 3 DAYS Performed at [facility], [place] [address] [place], [place] [address]    Report Status PENDING  Incomplete  Body fluid culture     Status: None (Preliminary result)   Collection Time: 07/23/17  6:10 PM  Result Value Ref Range Status   Specimen Description FLUID SYNOVIAL SHOULDER  Final   Special Requests NONE  Final   Gram Stain   Final    FEW WBC PRESENT,BOTH PMN AND MONONUCLEAR NO ORGANISMS SEEN    Culture   Final    NO GROWTH 3 DAYS Performed at [facility], [place] [address] [place], [place] [address]    Report Status PENDING  Incomplete  Surgical pcr screen     Status: Abnormal   Collection Time: 07/24/17 11:47 AM  Result Value Ref Range Status   MRSA, PCR NEGATIVE NEGATIVE Final    Staphylococcus aureus POSITIVE (A) NEGATIVE Final    Comment: (NOTE) The Xpert SA Assay (FDA approved for NASAL specimens in patients 36 years of age and older), is one component of a comprehensive surveillance program. It is not intended to diagnose infection nor to guide or monitor treatment. Performed at [facility], [place] [address], [place], [place] [address]   Aerobic/Anaerobic Culture (surgical/deep wound)     Status: None (Preliminary result)   Collection Time: 07/25/17 11:03 AM  Result Value Ref Range Status   Specimen Description TISSUE LEFT SHOULDER SPEC 1  Final   Special Requests PATIENT ON FOLLOWING ANCEF  Final   Gram Stain   Final    FEW  WBC PRESENT, PREDOMINANTLY PMN NO ORGANISMS SEEN    Culture   Final    NO GROWTH 1 DAY Performed at [facility] [address], [place], [place] [address]    Report Status PENDING  Incomplete  Aerobic/Anaerobic Culture (surgical/deep wound)     Status: None (Preliminary result)   Collection Time: 07/25/17 11:05 AM  Result Value Ref Range Status   Specimen Description TISSUE LEFT SHOULDER SPEC 2  Final   Special Requests PATIENT ON FOLLOWING ANCEF  Final   Gram Stain   Final    FEW WBC PRESENT, PREDOMINANTLY PMN NO ORGANISMS SEEN    Culture   Final    NO GROWTH 1 DAY Performed at [facility], [place] [address] [place],  [address]    Report Status PENDING  Incomplete         Radiology Studies: US Renal  Result Date: 07/25/2017 CLINICAL DATA:  Elevated creatinine EXAM: RENAL / URINARY TRACT ULTRASOUND COMPLETE COMPARISON:  CT 07/19/2017 FINDINGS: Right Kidney: Length: 13.8 cm.  Increased echotexture.  No mass or hydronephrosis. Left Kidney: Length: 12.9 cm.  Increased echotexture.  No mass or hydronephrosis. Bladder: Appears normal for degree of bladder distention. IMPRESSION: Increased echotexture within the kidneys bilaterally suggesting chronic medical renal disease. Electronically Signed   By:  Rolm Baptise M.D.   On: 07/25/2017 18:28        Scheduled Meds: . ALPRAZolam  0.5 mg Oral BID  . Chlorhexidine Gluconate Cloth  6 each Topical Q0600  . citalopram  10 mg Oral Daily  . darbepoetin (ARANESP) injection - NON-DIALYSIS  100 mcg Subcutaneous Q Thu-1800  . feeding supplement (ENSURE ENLIVE)  237 mL Oral BID BM  . lidocaine (PF)  5 mL Other Once  . mupirocin ointment  1 application Nasal BID  . nicotine  21 mg Transdermal Daily  . NIFEdipine  30 mg Oral Daily  . tenofovir  300 mg Oral Q72H   Continuous Infusions: . sodium chloride 125 mL/hr at 07/25/17 0504  . sodium chloride 10 mL/hr at 07/24/17 1248  . sodium chloride 10 mL/hr at 07/25/17 1830  . DAPTOmycin (CUBICIN)  IV Stopped (07/26/17 1250)     LOS: 5 days      Georgette Shell, MD [facility]  If 7PM-7AM, please contact night-coverage [email or web address] Password [facility] 07/27/2017, 9:59 AM

## 2017-07-27 NOTE — Progress Notes (Signed)
PHARMACY CONSULT NOTE FOR:  OUTPATIENT  PARENTERAL ANTIBIOTIC THERAPY (OPAT)  Indication: Septic Arthritis  Regimen: Daptomycin 620 mg (~70m/kg) every 48 hours End date: 08/23/17  IV antibiotic discharge orders are pended. To discharging provider:  please sign these orders via discharge navigator,  Select New Orders & click on the button choice - Manage This Unsigned Work.     Thank you for allowing pharmacy to be a part of this patient's care.   EJimmy Footman PharmD, BCPS PGY2 Infectious Diseases Pharmacy Resident Pager: [phone] 07/27/2017, 3:53 PM

## 2017-07-27 NOTE — Progress Notes (Signed)
Patient has been very lethargic all shift. Patient requests for pain medication very frequently but can barely keep eyes open to have a conversation. Based on nursing judgement, provided feedback to patient that we should try to spread out pain medication a little longer. Patient seemed hesitant of that idea and said that "it was only because he was tired". Will continue to monitor patient.

## 2017-07-27 NOTE — Progress Notes (Signed)
[facility] Rounding Note  Subjective/Interval History:  No new events   Objective Vital signs in last 24 hours: Vitals:   07/26/17 1400 07/26/17 2140 07/27/17 0508 07/27/17 1324  BP: 138/84 125/75 102/60 98/64  Pulse: 87 81 78 90  Resp:   16 14  Temp: 98.4 F (36.9 C) 98.2 F (36.8 C) 98.4 F (36.9 C) 98.2 F (36.8 C)  TempSrc: Oral Oral Oral Oral  SpO2: 99% 96% 96% 95%  Weight:      Height:       Weight change:   Intake/Output Summary (Last 24 hours) at 07/27/2017 1419 Last data filed at 07/27/2017 0900 Gross per 24 hour  Intake 2053.57 ml  Output 2400 ml  Net -346.43 ml   Physical Exam:  Blood pressure 98/64, pulse 90, temperature 98.2 F (36.8 C), temperature source Oral, resp. rate 14, height 5' 7.99" (1.727 m), weight 77.3 kg (170 lb 6.7 oz), SpO2 95 %.  WDWN. NAD No JVD Lungs clear  S1S2 No S3 3/6 apical pansystolic murmur No diastolic murmur Abd soft not tender 1-2+ edema LE's Tenderness left shoulder/pain operative site Pain with ROM  Recent Labs  Lab 07/23/17 0419 07/24/17 0437 07/25/17 0426 07/26/17 0506 07/27/17 0520  NA 138 136 141 137 140  K 4.1 4.0 4.5 4.5 5.1  CL 113* 109 114* 111 111  CO2 16* 20* 19* 20* 21*  GLUCOSE 104* 93 91 109* 118*  BUN 31* 31* 35* 36* 41*  CREATININE 2.88* 2.93* 3.03* 3.07* 3.34*  CALCIUM 7.8* 8.1* 8.2* 7.9* 8.3*    Recent Labs  Lab 07/23/17 0419  AST 87*  ALT 43  ALKPHOS 80  BILITOT 0.9  PROT 7.7  ALBUMIN 2.2*    Recent Labs  Lab 07/23/17 0419 07/24/17 0437 07/25/17 0426 07/26/17 0506 07/27/17 0520  WBC 3.7* 5.3 4.9 5.6 6.3  NEUTROABS 2.2  --   --  3.0 3.6  HGB 7.9* 8.9* 9.1* 7.3* 7.6*  HCT 24.3* 27.6* 27.9* 22.8* 23.1*  MCV 98.0 99.3 99.3 100.0 97.9  PLT 100* 111* 98* 84* 108*    Recent Labs  Lab 07/25/17 0426  CKTOTAL 110   CBG: Recent Labs  Lab 07/23/17 0742 07/24/17 0808 07/25/17 0739 07/26/17 0806  GLUCAP 88 90 96 108*    Recent Labs  Lab 07/23/17 0621  IRON 34*  TIBC 316   FERRITIN 82   Studies/Results: US Renal  Result Date: 07/25/2017 CLINICAL DATA:  Elevated creatinine EXAM: RENAL / URINARY TRACT ULTRASOUND COMPLETE COMPARISON:  CT 07/19/2017 FINDINGS: Right Kidney: Length: 13.8 cm.  Increased echotexture.  No mass or hydronephrosis. Left Kidney: Length: 12.9 cm.  Increased echotexture.  No mass or hydronephrosis. Bladder: Appears normal for degree of bladder distention. IMPRESSION: Increased echotexture within the kidneys bilaterally suggesting chronic medical renal disease. Electronically Signed   By: Rolm Baptise M.D.   On: 07/25/2017 18:28   Medications: . sodium chloride 125 mL/hr at 07/25/17 0504  . sodium chloride 10 mL/hr at 07/24/17 1248  . sodium chloride 10 mL/hr at 07/25/17 1830  . DAPTOmycin (CUBICIN)  IV Stopped (07/26/17 1250)   . [START ON 07/28/2017] ALPRAZolam  0.5 mg Oral Daily  . Chlorhexidine Gluconate Cloth  6 each Topical Q0600  . citalopram  10 mg Oral Daily  . darbepoetin (ARANESP) injection - NON-DIALYSIS  100 mcg Subcutaneous Q Thu-1800  . feeding supplement (ENSURE ENLIVE)  237 mL Oral BID BM  . lidocaine (PF)  5 mL Other Once  . mupirocin ointment  1 application Nasal BID  . nicotine  21 mg Transdermal Daily  . NIFEdipine  30 mg Oral Daily  . 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 Original presentation to [facility] was with AMS 2/2 metabolic encephalopathy/polysubstance abuse. Found to have AKI,  labs from 2015 showed creatinine of 1.2-1.3,  but more recent records in March, 2019 was 0.9. Was 3.3 on adm to [facility], 2.7 at time of transfer here, rising since 5/19 admission, we were asked to see on 5/23. No prior AKI, no NSAIDS, renal US non-obstr stones w/13.8, 12.9 cm echodense kidneys, UA neg for protein but large blood,  21-50 RBC's. Normal C3 C4, neg HIV   Assessment/Recommendations  1. Acute kidney injury on chronic kidney disease stage III: History, timeline of events and available data not definitive in establishing the exact etiology of his acute kidney injury but suspect multifactorial in the setting of ongoing tenofovir use, possible volume depletion with poor oral intake with nausea/vomiting. Neg fluid balance at time of initial consultation, good UOP, so no additional diuretics given for LE edema    1. Normal complements and no proteinuria speak against GN associated with bacterial infection, or due to [name] or Hep C.  2. Vanco level 13 OK and has since been changed to dapto.  3. Nothing to do differently at this time other than to monitor  2. Left shoulder osteomyelitis/chronic infection, MRSA bacteremia: S/P arthroscopic irrigation and debridement. Dapto. Ortho following.  3. Hypertension: Meds. 4. Anemia: Secondary to chronic illness as well as iron deficiency.   1. Active infection at this time precludes intravenous iron 2. Aranesp 100 started 5/23 5. Chronic hepatitis B and C infections: Ongoing management with tenofovir, reports that he will undergo treatment for hepatitis C once he completes his tenofovir of course.  Tenofovir appears to be appropriately renally dosed at this time. 6. Polysubstance abuse: Engaged in discussion and counseling.   Jamal Maes, MD [facility] [phone] pager 07/27/2017, 2:19 PM

## 2017-07-28 LAB — RENAL FUNCTION PANEL
ALBUMIN: 2.3 g/dL — AB (ref 3.5–5.0)
Anion gap: 7 (ref 5–15)
BUN: 46 mg/dL — AB (ref 6–20)
CALCIUM: 8.6 mg/dL — AB (ref 8.9–10.3)
CHLORIDE: 110 mmol/L (ref 101–111)
CO2: 23 mmol/L (ref 22–32)
CREATININE: 3.46 mg/dL — AB (ref 0.61–1.24)
GFR, EST AFRICAN AMERICAN: 22 mL/min — AB (ref >60)
GFR, EST NON AFRICAN AMERICAN: 19 mL/min — AB (ref >60)
Glucose, Bld: 90 mg/dL (ref 65–99)
PHOSPHORUS: 4.4 mg/dL (ref 2.5–4.6)
Potassium: 5 mmol/L (ref 3.5–5.1)
SODIUM: 140 mmol/L (ref 135–145)

## 2017-07-28 LAB — CBC WITH DIFFERENTIAL/PLATELET
ABS IMMATURE GRANULOCYTES: 0 10*3/uL (ref 0.0–0.1)
BASOS ABS: 0 10*3/uL (ref 0.0–0.1)
Basophils Relative: 1 %
Eosinophils Absolute: 0.3 10*3/uL (ref 0.0–0.7)
Eosinophils Relative: 6 %
HCT: 24.9 % — ABNORMAL LOW (ref 39.0–52.0)
Hemoglobin: 8.2 g/dL — ABNORMAL LOW (ref 13.0–17.0)
IMMATURE GRANULOCYTES: 0 %
LYMPHS ABS: 1.6 10*3/uL (ref 0.7–4.0)
LYMPHS PCT: 28 %
MCH: 32 pg (ref 26.0–34.0)
MCHC: 32.9 g/dL (ref 30.0–36.0)
MCV: 97.3 fL (ref 78.0–100.0)
MONO ABS: 0.8 10*3/uL (ref 0.1–1.0)
Monocytes Relative: 13 %
NEUTROS ABS: 2.9 10*3/uL (ref 1.7–7.7)
Neutrophils Relative %: 52 %
PLATELETS: 116 10*3/uL — AB (ref 150–400)
RBC: 2.56 MIL/uL — AB (ref 4.22–5.81)
RDW: 15.2 % (ref 11.5–15.5)
WBC: 5.6 10*3/uL (ref 4.0–10.5)

## 2017-07-28 LAB — CULTURE, BLOOD (ROUTINE X 2)
Culture: NO GROWTH
Culture: NO GROWTH

## 2017-07-28 LAB — GLUCOSE, CAPILLARY
GLUCOSE-CAPILLARY: 90 mg/dL (ref 65–99)
Glucose-Capillary: 119 mg/dL — ABNORMAL HIGH (ref 65–99)

## 2017-07-28 MED ORDER — DAPTOMYCIN IV (FOR PTA / DISCHARGE USE ONLY): 620.0000 mg | INTRAVENOUS | 0 refills | Status: DC

## 2017-07-28 NOTE — Progress Notes (Signed)
PROGRESS NOTE    Glenn Alvarado  NFA:[id number] DOB: [birth year] DOA: 07/22/2017 PCP: Harvie Junior, MD   Brief Narrative:52 year old male with history of coronary artery disease, hypertension, hyperlipidemia, COPD, depression, anxiety, substance abuse disorder was admitted to [facility] few days ago with acute encephalopathy and questionable suicidal ideation. In the ED at [facility] he was noted to have leukocytosis, renal failure, anion gap metabolic acidosis, elevated bilirubin and AST as well as worsening anemia from his baseline. Urine drug screen was positive for amphetamines and benzodiazepines. Following admission patient improved, his encephalopathy resolved. Due to concern for suicidal ideation psychiatry was consulted and the patient was cleared from psychiatry standpoint. He incidentally was noted to have 1/4 blood cultures positive for MRSA. He has also been complaining of worsening left shoulder pain and underwent the plain imaging as well as a CT scan which was concerning for osteomyelitis of the glenoid humeral joint. Orthopedics were consulted and recommended transfer to a tertiary center and he was moved to [facility] orthopedic surgery and ID were consulted. Underwent an MRI which was positive for septic arthritis and he was taken to the operating room on 5/21    Assessment & Plan:   Principal Problem:   MRSA bacteremia Active Problems:   CAD (coronary artery disease)   Depression with anxiety   Essential hypertension   GERD (gastroesophageal reflux disease)   HLD (hyperlipidemia)   Substance abuse ([place])   AKI (acute kidney injury) ([place])   Pancytopenia ([place])   Acute metabolic encephalopathy   Septic joint of left shoulder region ([place])   Left shoulder pain   Abnormal LFTs   Tobacco abuse   COPD (chronic obstructive pulmonary disease) ([name])   Chronic viral hepatitis B with cirrhosis ([name])   Hepatitis C antibody test positive 1] left  shoulder septic arthritis /MRSA bacteremia -status post irrigation and debridement 07/25/2017.  Repeat blood cultures negative.  Currently on daptomycin for 4 more weeks till June 20.  Echo showed no evidence of endocarditis.  This patient was transferred transferred here from [facility]  2] AKI creatinine increasing.  Nephrology following.  Renal ultrasound no obstruction.    3] history of cirrhosis of the liver/hepatitis B followed at [facility]  4] substance abuse disorder-patient is very upset that the Xanax has been decreased.  Continue Celexa.      DVT prophylaxis: SCD Code Status: Full code Family Communication: No family available  Disposition Plan: Plan is to discharge patient to a skilled nursing facility once stable to continue and finish his antibiotic course.  Patient will need PICC line for this reason.  He is not ready to be discharged yet at this time with his increasing creatinine.   Consultants:   Procedures:  Antimicrobials:   Subjective:   Objective: Vitals:   07/27/17 0508 07/27/17 1324 07/27/17 2022 07/28/17 0410  BP: 102/60 98/64 110/67 111/69  Pulse: 78 90 95 86  Resp: 16 14 16 17   Temp: 98.4 F (36.9 C) 98.2 F (36.8 C) 98.2 F (36.8 C) 98.4 F (36.9 C)  TempSrc: Oral Oral Oral Oral  SpO2: 96% 95% 100% 99%  Weight:      Height:        Intake/Output Summary (Last 24 hours) at 07/28/2017 0959 Last data filed at 07/28/2017 0440 Gross per 24 hour  Intake 420 ml  Output 2400 ml  Net -1980 ml   Filed Weights   07/23/17 0200 07/25/17 0947  Weight: 77.3 kg (170 lb 6.7 oz)  77.3 kg (170 lb 6.7 oz)    Examination:  General exam: Appears calm and comfortable  Respiratory system: Clear to auscultation. Respiratory effort normal. Cardiovascular system: S1 & S2 heard, RRR. No JVD, murmurs, rubs, gallops or clicks. No pedal edema. Gastrointestinal system: Abdomen is nondistended, soft and nontender. No organomegaly or masses felt. Normal  bowel sounds heard. Central nervous system: Alert and oriented. No focal neurological deficits. Extremities: Symmetric 5 x 5 power. Skin: No rashes, lesions or ulcers Psychiatry: Judgement and insight appear normal. Mood & affect appropriate.     Data Reviewed: I have personally reviewed following labs and imaging studies  CBC: Recent Labs  Lab 07/23/17 0419 07/24/17 0437 07/25/17 0426 07/26/17 0506 07/27/17 0520 07/28/17 0515  WBC 3.7* 5.3 4.9 5.6 6.3 5.6  NEUTROABS 2.2  --   --  3.0 3.6 2.9  HGB 7.9* 8.9* 9.1* 7.3* 7.6* 8.2*  HCT 24.3* 27.6* 27.9* 22.8* 23.1* 24.9*  MCV 98.0 99.3 99.3 100.0 97.9 97.3  PLT 100* 111* 98* 84* 108* 811*   Basic Metabolic Panel: Recent Labs  Lab 07/24/17 0437 07/25/17 0426 07/26/17 0506 07/27/17 0520 07/28/17 0515  NA 136 141 137 140 140  K 4.0 4.5 4.5 5.1 5.0  CL 109 114* 111 111 110  CO2 20* 19* 20* 21* 23  GLUCOSE 93 91 109* 118* 90  BUN 31* 35* 36* 41* 46*  CREATININE 2.93* 3.03* 3.07* 3.34* 3.46*  CALCIUM 8.1* 8.2* 7.9* 8.3* 8.6*  PHOS  --   --   --   --  4.4   GFR: Estimated Creatinine Clearance: 24.7 mL/min (A) (by C-G formula based on SCr of 3.46 mg/dL (H)). Liver Function Tests: Recent Labs  Lab 07/23/17 0419 07/28/17 0515  AST 87*  --   ALT 43  --   ALKPHOS 80  --   BILITOT 0.9  --   PROT 7.7  --   ALBUMIN 2.2* 2.3*   No results for input(s): LIPASE, AMYLASE in the last 168 hours. No results for input(s): AMMONIA in the last 168 hours. Coagulation Profile: Recent Labs  Lab 07/23/17 0419  INR 1.45   Cardiac Enzymes: Recent Labs  Lab 07/25/17 0426  CKTOTAL 110   BNP (last 3 results) No results for input(s): PROBNP in the last 8760 hours. HbA1C: No results for input(s): HGBA1C in the last 72 hours. CBG: Recent Labs  Lab 07/23/17 0742 07/24/17 0808 07/25/17 0739 07/26/17 0806 07/28/17 0752  GLUCAP 88 90 96 108* 90   Lipid Profile: No results for input(s): CHOL, HDL, LDLCALC, TRIG, CHOLHDL,  LDLDIRECT in the last 72 hours. Thyroid Function Tests: No results for input(s): TSH, T4TOTAL, FREET4, T3FREE, THYROIDAB in the last 72 hours. Anemia Panel: No results for input(s): VITAMINB12, FOLATE, FERRITIN, TIBC, IRON, RETICCTPCT in the last 72 hours. Sepsis Labs: No results for input(s): PROCALCITON, LATICACIDVEN in the last 168 hours.  Recent Results (from the past 240 hour(s))  Culture, blood (Routine X 2) w Reflex to ID Panel     Status: None (Preliminary result)   Collection Time: 07/23/17  3:59 AM  Result Value Ref Range Status   Specimen Description BLOOD RIGHT HAND  Final   Special Requests   Final    BOTTLES DRAWN AEROBIC AND ANAEROBIC Blood Culture results may not be optimal due to an inadequate volume of blood received in culture bottles   Culture   Final    NO GROWTH 4 DAYS Performed at [facility], [place]  [address], [place], [place] [address]    Report Status PENDING  Incomplete  Culture, blood (Routine X 2) w Reflex to ID Panel     Status: None (Preliminary result)   Collection Time: 07/23/17  4:19 AM  Result Value Ref Range Status   Specimen Description BLOOD LEFT HAND  Final   Special Requests   Final    BOTTLES DRAWN AEROBIC AND ANAEROBIC Blood Culture results may not be optimal due to an inadequate volume of blood received in culture bottles   Culture   Final    NO GROWTH 4 DAYS Performed at [facility], [place] [address], [place], [place] [address]    Report Status PENDING  Incomplete  Body fluid culture     Status: None   Collection Time: 07/23/17  6:10 PM  Result Value Ref Range Status   Specimen Description FLUID SYNOVIAL SHOULDER  Final   Special Requests NONE  Final   Gram Stain   Final    FEW WBC PRESENT,BOTH PMN AND MONONUCLEAR NO ORGANISMS SEEN    Culture   Final    NO GROWTH 3 DAYS Performed at [facility], [place] [address], [place], [place] [address]    Report Status 07/27/2017 FINAL  Final  Surgical pcr screen     Status:  Abnormal   Collection Time: 07/24/17 11:47 AM  Result Value Ref Range Status   MRSA, PCR NEGATIVE NEGATIVE Final   Staphylococcus aureus POSITIVE (A) NEGATIVE Final    Comment: (NOTE) The Xpert SA Assay (FDA approved for NASAL specimens in patients 42 years of age and older), is one component of a comprehensive surveillance program. It is not intended to diagnose infection nor to guide or monitor treatment. Performed at [facility], [place] [address], [place], [place] [address]   Aerobic/Anaerobic Culture (surgical/deep wound)     Status: None (Preliminary result)   Collection Time: 07/25/17 11:03 AM  Result Value Ref Range Status   Specimen Description TISSUE LEFT SHOULDER SPEC 1  Final   Special Requests PATIENT ON FOLLOWING ANCEF  Final   Gram Stain   Final    FEW WBC PRESENT, PREDOMINANTLY PMN NO ORGANISMS SEEN    Culture   Final    NO GROWTH 2 DAYS NO ANAEROBES ISOLATED; CULTURE IN PROGRESS FOR 5 DAYS Performed at [facility], [place] [address], [place], [place] [address]    Report Status PENDING  Incomplete  Aerobic/Anaerobic Culture (surgical/deep wound)     Status: None (Preliminary result)   Collection Time: 07/25/17 11:05 AM  Result Value Ref Range Status   Specimen Description TISSUE LEFT SHOULDER SPEC 2  Final   Special Requests PATIENT ON FOLLOWING ANCEF  Final   Gram Stain   Final    FEW WBC PRESENT, PREDOMINANTLY PMN NO ORGANISMS SEEN    Culture   Final    NO GROWTH 2 DAYS NO ANAEROBES ISOLATED; CULTURE IN PROGRESS FOR 5 DAYS Performed at [facility], [place] [address], [place], [place] [address]    Report Status PENDING  Incomplete         Radiology Studies: No results found.      Scheduled Meds: . ALPRAZolam  0.5 mg Oral Daily  . Chlorhexidine Gluconate Cloth  6 each Topical Q0600  . citalopram  10 mg Oral Daily  . darbepoetin (ARANESP) injection - NON-DIALYSIS  100 mcg Subcutaneous Q Thu-1800  . feeding supplement (ENSURE  ENLIVE)  237 mL Oral BID  BM  . lidocaine (PF)  5 mL Other Once  . mupirocin ointment  1 application Nasal BID  . nicotine  21 mg Transdermal Daily  . NIFEdipine  30 mg Oral Daily  . tenofovir  300 mg Oral Q72H   Continuous Infusions: . sodium chloride 10 mL/hr at 07/24/17 1248  . sodium chloride 10 mL/hr at 07/25/17 1830  . DAPTOmycin (CUBICIN)  IV Stopped (07/26/17 1250)     LOS: 6 days     Georgette Shell, MD  If 7PM-7AM, please contact night-coverage [email or web address] Password [facility] 07/28/2017, 9:59 AM

## 2017-07-28 NOTE — Progress Notes (Deleted)
Pt's family declined multiple turns when offered, only prefer turning once per shift

## 2017-07-28 NOTE — Progress Notes (Addendum)
[facility] Rounding Note  Subjective/Interval History:  No new events  Many complaints Shoulder hurts Legs more swollen Keeps going back to "legs were fine until neurontin" Tried to reinforce it is the AKI causing the issue with the swelling...  Objective Vital signs in last 24 hours: Vitals:   07/27/17 1324 07/27/17 2022 07/28/17 0410 07/28/17 1258  BP: 98/64 110/67 111/69 118/71  Pulse: 90 95 86 98  Resp: 14 16 17 18   Temp: 98.2 F (36.8 C) 98.2 F (36.8 C) 98.4 F (36.9 C) 98 F (36.7 C)  TempSrc: Oral Oral Oral Oral  SpO2: 95% 100% 99% 94%  Weight:      Height:       Weight change:   Intake/Output Summary (Last 24 hours) at 07/28/2017 1345 Last data filed at 07/28/2017 0840 Gross per 24 hour  Intake 420 ml  Output 2510 ml  Net -2090 ml   Physical Exam:  Blood pressure 118/71, pulse 98, temperature 98 F (36.7 C), temperature source Oral, resp. rate 18, height 5' 7.99" (1.727 m), weight 77.3 kg (170 lb 6.7 oz), SpO2 94 %.  WDWN. NAD Sitting on the edge of the bed No JVD Lungs grossly clear  S1S2 No S3 3/6 apical pansystolic murmur No diastolic murmur Abd soft not tender 2+ pitting edema bilat LE's Tenderness left shoulder/pain operative site Arm in sling   Recent Labs  Lab 07/23/17 0419 07/24/17 0437 07/25/17 0426 07/26/17 0506 07/27/17 0520 07/28/17 0515  NA 138 136 141 137 140 140  K 4.1 4.0 4.5 4.5 5.1 5.0  CL 113* 109 114* 111 111 110  CO2 16* 20* 19* 20* 21* 23  GLUCOSE 104* 93 91 109* 118* 90  BUN 31* 31* 35* 36* 41* 46*  CREATININE 2.88* 2.93* 3.03* 3.07* 3.34* 3.46*  CALCIUM 7.8* 8.1* 8.2* 7.9* 8.3* 8.6*  PHOS  --   --   --   --   --  4.4    Recent Labs  Lab 07/23/17 0419 07/28/17 0515  AST 87*  --   ALT 43  --   ALKPHOS 80  --   BILITOT 0.9  --   PROT 7.7  --   ALBUMIN 2.2* 2.3*    Recent Labs  Lab 07/23/17 0419  07/25/17 0426 07/26/17 0506 07/27/17 0520 07/28/17 0515  WBC 3.7*   < > 4.9 5.6 6.3 5.6  NEUTROABS 2.2  --   --   3.0 3.6 2.9  HGB 7.9*   < > 9.1* 7.3* 7.6* 8.2*  HCT 24.3*   < > 27.9* 22.8* 23.1* 24.9*  MCV 98.0   < > 99.3 100.0 97.9 97.3  PLT 100*   < > 98* 84* 108* 116*   < > = values in this interval not displayed.    Recent Labs  Lab 07/25/17 0426  CKTOTAL 110    Recent Labs  Lab 07/24/17 0808 07/25/17 0739 07/26/17 0806 07/28/17 0752 07/28/17 1257  GLUCAP 90 96 108* 90 119*    Recent Labs  Lab 07/23/17 0621  IRON 34*  TIBC 316  FERRITIN 82  Results for Glenn, Alvarado (MRN [id number]) as of 07/28/2017 14:13  07/26/2017 17:08  Appearance CLEAR  Bilirubin Urine NEGATIVE  Color, Urine STRAW (A)  Glucose NEGATIVE  Hgb urine dipstick LARGE (A)  Ketones, ur NEGATIVE  Leukocytes, UA NEGATIVE  Nitrite NEGATIVE  pH 5.0  Protein NEGATIVE  Specific Gravity, Urine 1.003 (L)  Bacteria, UA RARE (A)  Mucus PRESENT  RBC / HPF  21-50  WBC, UA 0-5    Medications: . sodium chloride 10 mL/hr at 07/24/17 1248  . sodium chloride 10 mL/hr at 07/25/17 1830  . DAPTOmycin (CUBICIN)  IV Stopped (07/28/17 1210)   . ALPRAZolam  0.5 mg Oral Daily  . Chlorhexidine Gluconate Cloth  6 each Topical Q0600  . citalopram  10 mg Oral Daily  . darbepoetin (ARANESP) injection - NON-DIALYSIS  100 mcg Subcutaneous Q Thu-1800  . feeding supplement (ENSURE ENLIVE)  237 mL Oral BID BM  . lidocaine (PF)  5 mL Other Once  . mupirocin ointment  1 application Nasal BID  . nicotine  21 mg Transdermal Daily  . NIFEdipine  30 mg Oral Daily  . 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 Original presentation to [facility] was with AMS 2/2 metabolic encephalopathy/polysubstance abuse. Found to have AKI, labs from 2015 showed creatinine of 1.2-1.3,  but more recent records in March, 2019 was 0.9. Was 3.3 on adm  to [facility], 2.7 at time of transfer here, rising since 5/19 admission, we were asked to see on 5/23. No prior AKI, no NSAIDS, renal US non-obstr stones w/13.8, 12.9 cm echodense kidneys, UA neg for protein but large blood, 21-50 RBC's. Normal C3 C4, neg HIV   Assessment/Recommendations  1. AKI on CKD stage III (baseline 0.9-1.3) Suspect multifactorial in the setting of ongoing tenofovir use, possible volume depletion with poor oral intake with nausea/vomiting, ifection. Neg fluid balance at time of initial consultation, good UOP, so no additional diuretics given for LE edema    1. Normal complements and no proteinuria speak against GN associated with bacterial infection, or due to [name] or Hep C.  2. Vanco level 13 was OK and has since been changed to dapto.  3. K and acid base still OK, but creatinine creep continues 4. Nothing to do differently at this time other than to monitor  5. Tenofovir appears to be appropriately renally dosed at this time but if renal continues to worsen will address with Dr. Baxter Flattery if could hold this med and see if any change in creatinine trajectory  2. Left shoulder septic arthritis/MRSA bacteremia: S/P arthroscopic irrigation and debridement. Dapto until June 20. Ortho following.  3. Hypertension: Meds. 4. Anemia: Secondary to chronic illness as well as iron deficiency.   1. Active infection at this time precludes intravenous iron 2. Aranesp 100 started 5/23 5. Chronic hepatitis B and C infections: Ongoing management with tenofovir, reports that he will undergo treatment for hepatitis C once he completes his tenofovir of course.  Tenofovir appears to be appropriately renally dosed at this time.  6. Polysubstance abuse: Engaged in discussion and counseling.   Jamal Maes, MD [facility] [phone] pager 07/28/2017, 1:45 PM

## 2017-07-29 LAB — BASIC METABOLIC PANEL WITH GFR
Anion gap: 8 (ref 5–15)
BUN: 45 mg/dL — ABNORMAL HIGH (ref 6–20)
CO2: 22 mmol/L (ref 22–32)
Calcium: 8.4 mg/dL — ABNORMAL LOW (ref 8.9–10.3)
Chloride: 108 mmol/L (ref 101–111)
Creatinine, Ser: 3.63 mg/dL — ABNORMAL HIGH (ref 0.61–1.24)
GFR calc Af Amer: 21 mL/min — ABNORMAL LOW
GFR calc non Af Amer: 18 mL/min — ABNORMAL LOW
Glucose, Bld: 86 mg/dL (ref 65–99)
Potassium: 4.9 mmol/L (ref 3.5–5.1)
Sodium: 138 mmol/L (ref 135–145)

## 2017-07-29 LAB — GLUCOSE, CAPILLARY: GLUCOSE-CAPILLARY: 92 mg/dL (ref 65–99)

## 2017-07-29 LAB — RENAL FUNCTION PANEL
ALBUMIN: 2.4 g/dL — AB (ref 3.5–5.0)
Anion gap: 7 (ref 5–15)
BUN: 45 mg/dL — ABNORMAL HIGH (ref 6–20)
CALCIUM: 8.5 mg/dL — AB (ref 8.9–10.3)
CO2: 23 mmol/L (ref 22–32)
Chloride: 109 mmol/L (ref 101–111)
Creatinine, Ser: 3.59 mg/dL — ABNORMAL HIGH (ref 0.61–1.24)
GFR calc Af Amer: 21 mL/min — ABNORMAL LOW (ref >60)
GFR, EST NON AFRICAN AMERICAN: 18 mL/min — AB (ref >60)
GLUCOSE: 90 mg/dL (ref 65–99)
PHOSPHORUS: 4.6 mg/dL (ref 2.5–4.6)
POTASSIUM: 4.9 mmol/L (ref 3.5–5.1)
SODIUM: 139 mmol/L (ref 135–145)

## 2017-07-29 LAB — CBC WITH DIFFERENTIAL/PLATELET
Abs Immature Granulocytes: 0 K/uL (ref 0.0–0.1)
Basophils Absolute: 0 K/uL (ref 0.0–0.1)
Basophils Relative: 1 %
Eosinophils Absolute: 0.4 K/uL (ref 0.0–0.7)
Eosinophils Relative: 6 %
HCT: 24.9 % — ABNORMAL LOW (ref 39.0–52.0)
Hemoglobin: 8.1 g/dL — ABNORMAL LOW (ref 13.0–17.0)
Immature Granulocytes: 0 %
Lymphocytes Relative: 31 %
Lymphs Abs: 1.8 K/uL (ref 0.7–4.0)
MCH: 31.6 pg (ref 26.0–34.0)
MCHC: 32.5 g/dL (ref 30.0–36.0)
MCV: 97.3 fL (ref 78.0–100.0)
Monocytes Absolute: 0.7 K/uL (ref 0.1–1.0)
Monocytes Relative: 11 %
Neutro Abs: 3 K/uL (ref 1.7–7.7)
Neutrophils Relative %: 51 %
Platelets: 132 K/uL — ABNORMAL LOW (ref 150–400)
RBC: 2.56 MIL/uL — ABNORMAL LOW (ref 4.22–5.81)
RDW: 15.2 % (ref 11.5–15.5)
WBC: 5.9 K/uL (ref 4.0–10.5)

## 2017-07-29 MED ORDER — OXYCODONE HCL 5 MG PO TABS
10.0000 mg | ORAL_TABLET | ORAL | Status: DC | PRN
  Administered 2017-07-29 – 2017-08-09 (×63): 10 mg via ORAL
  Filled 2017-07-29 (×63): qty 2

## 2017-07-29 NOTE — Progress Notes (Signed)
PROGRESS NOTE    Glenn Alvarado  BOF:[id number] DOB: Jun 02, 1965 DOA: 07/22/2017 PCP: Glenn Junior, MD   Brief Narrative:52 year old male with history of coronary artery disease, hypertension, hyperlipidemia, COPD, depression, anxiety, substance abuse disorder was admitted to [facility] few days ago with acute encephalopathy and questionable suicidal ideation. In the ED at [facility] he was noted to have leukocytosis, renal failure, anion gap metabolic acidosis, elevated bilirubin and AST as well as worsening anemia from his baseline. Urine drug screen was positive for amphetamines and benzodiazepines. Following admission patient improved, his encephalopathy resolved. Due to concern for suicidal ideation psychiatry was consulted and the patient was cleared from psychiatry standpoint. He incidentally was noted to have 1/4 blood cultures positive for MRSA. He has also been complaining of worsening left shoulder pain and underwent the plain imaging as well as a CT scan which was concerning for osteomyelitis of the glenoid humeral joint. Orthopedics were consulted and recommended transfer to a tertiary center and he was moved to [facility] orthopedic surgery and ID were consulted. Underwent an MRI which was positive for septic arthritis and he was taken to the operating room on 5/21      Assessment & Plan:   Principal Problem:   MRSA bacteremia Active Problems:   CAD (coronary artery disease)   Depression with anxiety   Essential hypertension   GERD (gastroesophageal reflux disease)   HLD (hyperlipidemia)   Substance abuse ([place])   AKI (acute kidney injury) ([place])   Pancytopenia ([place])   Acute metabolic encephalopathy   Septic joint of left shoulder region ([place])   Left shoulder pain   Abnormal LFTs   Tobacco abuse   COPD (chronic obstructive pulmonary disease) ([name])   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  Repeat blood cultures negative.  Currently on daptomycin for 4 more weeks till June 20.  Echo showed no evidence of endocarditis.  This patient was transferred transferred here from [facility] morphine.  2] AKI creatinine better..  Nephrology following.  Renal ultrasound no obstruction.    3] history of cirrhosis of the liver/hepatitis B followed at [facility].  4] substance abuse disorder-patient is very upset that the Xanax has been decreased.  Continue Celexa.      DVT prophylaxis: scd Code Status:full Family Communication: none Disposition Plan: plan dc to snf once renal functions better.he agreed to go to snf in [place].sw consult.  Consultants: Renal, ID, Ortho   Procedures: Shoulder irrigation and debridement 07/25/2017 Antimicrobials: Daptomycin Subjective: Lots of complains about pain medications asking for more pain medications, concerned about lower extremity swelling secondary to Neurontin.  I have discussed with him many times is not the Neurontin and it is because of his AKI   Objective: Vitals:   07/28/17 1258 07/28/17 2120 07/29/17 0645 07/29/17 0650  BP: 118/71 (!) 153/98 (!) 148/89   Pulse: 98 85 75   Resp: 18 20 17    Temp: 98 F (36.7 C) 97.7 F (36.5 C) 98.9 F (37.2 C)   TempSrc: Oral Oral Oral   SpO2: 94% 100% 100%   Weight:    76.5 kg (168 lb 10.4 oz)  Height:        Intake/Output Summary (Last 24 hours) at 07/29/2017 0949 Last data filed at 07/29/2017 0750 Gross per 24 hour  Intake -  Output 2400 ml  Net -2400 ml  Filed Weights   07/23/17 0200 07/25/17 0947 07/29/17 0650  Weight: 77.3 kg (170 lb 6.7 oz) 77.3 kg (170 lb 6.7 oz) 76.5 kg (168 lb 10.4 oz)    Examination:  General exam: Appears calm and comfortable  Respiratory system: Clear to auscultation. Respiratory effort normal. Cardiovascular system: S1 & S2 heard, RRR. No  JVD, murmurs, rubs, gallops or clicks. No pedal edema. Gastrointestinal system: Abdomen is nondistended, soft and nontender. No organomegaly or masses felt. Normal bowel sounds heard. Central nervous system: Alert and oriented. No focal neurological deficits. Extremities: Trace edema in bilateral lower extremity left shoulder with dressings on. Skin: No rashes, lesions or ulcers Psychiatry: Judgement and insight appear normal. Mood & affect appropriate.     Data Reviewed: I have personally reviewed following labs and imaging studies  CBC: Recent Labs  Lab 07/23/17 0419  07/25/17 0426 07/26/17 0506 07/27/17 0520 07/28/17 0515 07/29/17 0344  WBC 3.7*   < > 4.9 5.6 6.3 5.6 5.9  NEUTROABS 2.2  --   --  3.0 3.6 2.9 3.0  HGB 7.9*   < > 9.1* 7.3* 7.6* 8.2* 8.1*  HCT 24.3*   < > 27.9* 22.8* 23.1* 24.9* 24.9*  MCV 98.0   < > 99.3 100.0 97.9 97.3 97.3  PLT 100*   < > 98* 84* 108* 116* 132*   < > = values in this interval not displayed.   Basic Metabolic Panel: Recent Labs  Lab 07/25/17 0426 07/26/17 0506 07/27/17 0520 07/28/17 0515 07/29/17 0344  NA 141 137 140 140 139  138  K 4.5 4.5 5.1 5.0 4.9  4.9  CL 114* 111 111 110 109  108  CO2 19* 20* 21* 23 23  22   GLUCOSE 91 109* 118* 90 90  86  BUN 35* 36* 41* 46* 45*  45*  CREATININE 3.03* 3.07* 3.34* 3.46* 3.59*  3.63*  CALCIUM 8.2* 7.9* 8.3* 8.6* 8.5*  8.4*  PHOS  --   --   --  4.4 4.6   GFR: Estimated Creatinine Clearance: 23.8 mL/min (A) (by C-G formula based on SCr of 3.59 mg/dL (H)). Liver Function Tests: Recent Labs  Lab 07/23/17 0419 07/28/17 0515 07/29/17 0344  AST 87*  --   --   ALT 43  --   --   ALKPHOS 80  --   --   BILITOT 0.9  --   --   PROT 7.7  --   --   ALBUMIN 2.2* 2.3* 2.4*   No results for input(s): LIPASE, AMYLASE in the last 168 hours. No results for input(s): AMMONIA in the last 168 hours. Coagulation Profile: Recent Labs  Lab 07/23/17 0419  INR 1.45   Cardiac Enzymes: Recent Labs   Lab 07/25/17 0426  CKTOTAL 110   BNP (last 3 results) No results for input(s): PROBNP in the last 8760 hours. HbA1C: No results for input(s): HGBA1C in the last 72 hours. CBG: Recent Labs  Lab 07/25/17 0739 07/26/17 0806 07/28/17 0752 07/28/17 1257 07/29/17 0839  GLUCAP 96 108* 90 119* 92   Lipid Profile: No results for input(s): CHOL, HDL, LDLCALC, TRIG, CHOLHDL, LDLDIRECT in the last 72 hours. Thyroid Function Tests: No results for input(s): TSH, T4TOTAL, FREET4, T3FREE, THYROIDAB in the last 72 hours. Anemia Panel: No results for input(s): VITAMINB12, FOLATE, FERRITIN, TIBC, IRON, RETICCTPCT in the last 72 hours. Sepsis Labs: No results for input(s): PROCALCITON, LATICACIDVEN in the last 168 hours.  Recent Results (from the past 240 hour(s))  Culture,  blood (Routine X 2) w Reflex to ID Panel     Status: None   Collection Time: 07/23/17  3:59 AM  Result Value Ref Range Status   Specimen Description BLOOD RIGHT HAND  Final   Special Requests   Final    BOTTLES DRAWN AEROBIC AND ANAEROBIC Blood Culture results may not be optimal due to an inadequate volume of blood received in culture bottles   Culture   Final    NO GROWTH 5 DAYS Performed at [facility], [place] [address], [place], [place] [address]    Report Status 07/28/2017 FINAL  Final  Culture, blood (Routine X 2) w Reflex to ID Panel     Status: None   Collection Time: 07/23/17  4:19 AM  Result Value Ref Range Status   Specimen Description BLOOD LEFT HAND  Final   Special Requests   Final    BOTTLES DRAWN AEROBIC AND ANAEROBIC Blood Culture results may not be optimal due to an inadequate volume of blood received in culture bottles   Culture   Final    NO GROWTH 5 DAYS Performed at [facility], [place] [address], [place], [place] [address]    Report Status 07/28/2017 FINAL  Final  Body fluid culture     Status: None   Collection Time: 07/23/17  6:10 PM  Result Value Ref Range Status   Specimen  Description FLUID SYNOVIAL SHOULDER  Final   Special Requests NONE  Final   Gram Stain   Final    FEW WBC PRESENT,BOTH PMN AND MONONUCLEAR NO ORGANISMS SEEN    Culture   Final    NO GROWTH 3 DAYS Performed at [facility], [place] [address], [place], [place] [address]    Report Status 07/27/2017 FINAL  Final  Surgical pcr screen     Status: Abnormal   Collection Time: 07/24/17 11:47 AM  Result Value Ref Range Status   MRSA, PCR NEGATIVE NEGATIVE Final   Staphylococcus aureus POSITIVE (A) NEGATIVE Final    Comment: (NOTE) The Xpert SA Assay (FDA approved for NASAL specimens in patients 23 years of age and older), is one component of a comprehensive surveillance program. It is not intended to diagnose infection nor to guide or monitor treatment. Performed at [facility], [place] [address] [place], [place] [address]   Aerobic/Anaerobic Culture (surgical/deep wound)     Status: None (Preliminary result)   Collection Time: 07/25/17 11:03 AM  Result Value Ref Range Status   Specimen Description TISSUE LEFT SHOULDER SPEC 1  Final   Special Requests PATIENT ON FOLLOWING ANCEF  Final   Gram Stain   Final    FEW WBC PRESENT, PREDOMINANTLY PMN NO ORGANISMS SEEN    Culture   Final    NO GROWTH 3 DAYS NO ANAEROBES ISOLATED; CULTURE IN PROGRESS FOR 5 DAYS Performed at [facility], [place] [address] [place],  [address]    Report Status PENDING  Incomplete  Aerobic/Anaerobic Culture (surgical/deep wound)     Status: None (Preliminary result)   Collection Time: 07/25/17 11:05 AM  Result Value Ref Range Status   Specimen Description TISSUE LEFT SHOULDER SPEC 2  Final   Special Requests PATIENT ON FOLLOWING ANCEF  Final   Gram Stain   Final    FEW WBC PRESENT, PREDOMINANTLY PMN NO ORGANISMS SEEN    Culture   Final    NO GROWTH 3 DAYS NO ANAEROBES ISOLATED; CULTURE IN PROGRESS FOR  5 DAYS Performed at [facility], [place] [address], [place], [place] [address]     Report Status PENDING  Incomplete         Radiology Studies: No results found.      Scheduled Meds: . ALPRAZolam  0.5 mg Oral Daily  . Chlorhexidine Gluconate Cloth  6 each Topical Q0600  . citalopram  10 mg Oral Daily  . darbepoetin (ARANESP) injection - NON-DIALYSIS  100 mcg Subcutaneous Q Thu-1800  . feeding supplement (ENSURE ENLIVE)  237 mL Oral BID BM  . lidocaine (PF)  5 mL Other Once  . mupirocin ointment  1 application Nasal BID  . nicotine  21 mg Transdermal Daily  . NIFEdipine  30 mg Oral Daily  . tenofovir  300 mg Oral Q72H   Continuous Infusions: . sodium chloride 10 mL/hr at 07/24/17 1248  . sodium chloride 10 mL/hr at 07/25/17 1830  . DAPTOmycin (CUBICIN)  IV Stopped (07/28/17 1210)     LOS: 7 days     Georgette Shell, MD [facility] If 7PM-7AM, please contact night-coverage [email or web address] Password [facility] 07/29/2017, 9:49 AM

## 2017-07-29 NOTE — Progress Notes (Signed)
Spoke with Dr Lorrene Reid re PICC vs midline vs EDC.  States does not want any long term access in either arm due to renal function and need for vein preservation.  Recommends tunneled IV access if is to be d/c with IV needs.

## 2017-07-29 NOTE — Progress Notes (Signed)
[facility] Rounding Note  Subjective/Interval History:  No new events  Continues with many complaints Does not get gravity of his renal issues  ATB plan 4 weeks dapto with SNF placement to get Have advised against midline, would favor tunnelled cath for vein preservation  Objective Vital signs in last 24 hours: Vitals:   07/28/17 1258 07/28/17 2120 07/29/17 0645 07/29/17 0650  BP: 118/71 (!) 153/98 (!) 148/89   Pulse: 98 85 75   Resp: 18 20 17    Temp: 98 F (36.7 C) 97.7 F (36.5 C) 98.9 F (37.2 C)   TempSrc: Oral Oral Oral   SpO2: 94% 100% 100%   Weight:    76.5 kg (168 lb 10.4 oz)  Height:       Weight change:   Intake/Output Summary (Last 24 hours) at 07/29/2017 1008 Last data filed at 07/29/2017 0750 Gross per 24 hour  Intake -  Output 2400 ml  Net -2400 ml   Physical Exam:  Blood pressure (!) 148/89, pulse 75, temperature 98.9 F (37.2 C), temperature source Oral, resp. rate 17, height 5' 7.99" (1.727 m), weight 76.5 kg (168 lb 10.4 oz), SpO2 100 %.  WDWN. NAD Sitting on the edge of the bed No JVD Lungs grossly clear  S1S2 No S3 3/6 apical pansystolic murmur No diastolic murmur Abd soft not tender 2+ pitting edema bilat LE's Tenderness left shoulder/pain operative site Arm in sling   Recent Labs  Lab 07/23/17 0419 07/24/17 0437 07/25/17 0426 07/26/17 0506 07/27/17 0520 07/28/17 0515 07/29/17 0344  NA 138 136 141 137 140 140 139  138  K 4.1 4.0 4.5 4.5 5.1 5.0 4.9  4.9  CL 113* 109 114* 111 111 110 109  108  CO2 16* 20* 19* 20* 21* 23 23  22   GLUCOSE 104* 93 91 109* 118* 90 90  86  BUN 31* 31* 35* 36* 41* 46* 45*  45*  CREATININE 2.88* 2.93* 3.03* 3.07* 3.34* 3.46* 3.59*  3.63*  CALCIUM 7.8* 8.1* 8.2* 7.9* 8.3* 8.6* 8.5*  8.4*  PHOS  --   --   --   --   --  4.4 4.6    Recent Labs  Lab 07/23/17 0419 07/28/17 0515 07/29/17 0344  AST 87*  --   --   ALT 43  --   --   ALKPHOS 80  --   --   BILITOT 0.9  --   --   PROT 7.7  --   --    ALBUMIN 2.2* 2.3* 2.4*    Recent Labs  Lab 07/26/17 0506 07/27/17 0520 07/28/17 0515 07/29/17 0344  WBC 5.6 6.3 5.6 5.9  NEUTROABS 3.0 3.6 2.9 3.0  HGB 7.3* 7.6* 8.2* 8.1*  HCT 22.8* 23.1* 24.9* 24.9*  MCV 100.0 97.9 97.3 97.3  PLT 84* 108* 116* 132*    Recent Labs  Lab 07/25/17 0426  CKTOTAL 110    Recent Labs  Lab 07/25/17 0739 07/26/17 0806 07/28/17 0752 07/28/17 1257 07/29/17 0839  GLUCAP 96 108* 90 119* 92    Recent Labs  Lab 07/23/17 0621  IRON 34*  TIBC 316  FERRITIN 82  Results for Glenn Alvarado, Glenn Alvarado (MRN [id number]) as of 07/28/2017 14:13  07/26/2017 17:08  Appearance CLEAR  Bilirubin Urine NEGATIVE  Color, Urine STRAW (A)  Glucose NEGATIVE  Hgb urine dipstick LARGE (A)  Ketones, ur NEGATIVE  Leukocytes, UA NEGATIVE  Nitrite NEGATIVE  pH 5.0  Protein NEGATIVE  Specific Gravity, Urine 1.003 (L)  Bacteria, UA  RARE (A)  Mucus PRESENT  RBC / HPF 21-50  WBC, UA 0-5    Medications: . sodium chloride 10 mL/hr at 07/24/17 1248  . sodium chloride 10 mL/hr at 07/25/17 1830  . DAPTOmycin (CUBICIN)  IV Stopped (07/28/17 1210)   . ALPRAZolam  0.5 mg Oral Daily  . Chlorhexidine Gluconate Cloth  6 each Topical Q0600  . citalopram  10 mg Oral Daily  . darbepoetin (ARANESP) injection - NON-DIALYSIS  100 mcg Subcutaneous Q Thu-1800  . feeding supplement (ENSURE ENLIVE)  237 mL Oral BID BM  . lidocaine (PF)  5 mL Other Once  . mupirocin ointment  1 application Nasal BID  . nicotine  21 mg Transdermal Daily  . NIFEdipine  30 mg Oral Daily  . 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 Original presentation to [facility] was with AMS 2/2 metabolic encephalopathy/polysubstance abuse. Found to have AKI, labs from 2015 showed creatinine of 1.2-1.3,  but  more recent records in March, 2019 was 0.9. Was 3.3 on adm to [facility], 2.7 at time of transfer here, rising since 5/19 admission, we were asked to see on 5/23. No prior AKI, no NSAIDS, renal US non-obstr stones w/13.8, 12.9 cm echodense kidneys, UA neg for protein but large blood, 21-50 RBC's. Normal C3 C4, neg HIV   Assessment/Recommendations  1. AKI on CKD stage III (baseline 0.9-1.3) Suspect multifactorial in the setting of ongoing tenofovir use, possible volume depletion with poor oral intake with nausea/vomiting, ifection. Neg fluid balance at time of initial consultation, good UOP, so no additional diuretics given for LE edema    1. Normal complements and no proteinuria speak against GN associated with bacterial infection, or due to [name] or Hep C.  2. Vanco level 13 was OK and has since been changed to dapto so that is not issue  3. K and acid base still OK, but creatinine creep continues 4. Nothing to do differently at this time other than to monitor   2. Left shoulder septic arthritis/MRSA bacteremia: S/P arthroscopic irrigation and debridement. Dapto until June 20. SNF planned for ATB administration 3. Hypertension: Controlled. 4. Anemia: Secondary to chronic illness as well as iron deficiency.   1. Active infection at this time precludes intravenous iron 2. Aranesp 100 started 5/23 5. Chronic hepatitis B and C infections: Ongoing management with tenofovir, reports that he will undergo treatment for hepatitis C once he completes his tenofovir of course.  Tenofovir appears to be appropriately renally dosed at this time.  6. Polysubstance abuse  No new recommendations at this time.  Would ask Dr. Baxter Flattery on Monday if could hold his tenofovir for a while and see if any impact on the continued insidious creatinine rise.   Dr. Florene Glen will assume Nephrology care 5/27.   Jamal Maes, MD [facility] [phone] pager 07/29/2017, 10:08 AM

## 2017-07-30 LAB — GLUCOSE, CAPILLARY: Glucose-Capillary: 87 mg/dL (ref 65–99)

## 2017-07-30 LAB — AEROBIC/ANAEROBIC CULTURE (SURGICAL/DEEP WOUND): CULTURE: NO GROWTH

## 2017-07-30 LAB — CBC WITH DIFFERENTIAL/PLATELET
ABS IMMATURE GRANULOCYTES: 0 10*3/uL (ref 0.0–0.1)
BASOS PCT: 1 %
Basophils Absolute: 0 10*3/uL (ref 0.0–0.1)
EOS ABS: 0.3 10*3/uL (ref 0.0–0.7)
Eosinophils Relative: 6 %
HCT: 24.5 % — ABNORMAL LOW (ref 39.0–52.0)
Hemoglobin: 8 g/dL — ABNORMAL LOW (ref 13.0–17.0)
IMMATURE GRANULOCYTES: 0 %
Lymphocytes Relative: 26 %
Lymphs Abs: 1.2 10*3/uL (ref 0.7–4.0)
MCH: 32.3 pg (ref 26.0–34.0)
MCHC: 32.7 g/dL (ref 30.0–36.0)
MCV: 98.8 fL (ref 78.0–100.0)
Monocytes Absolute: 0.7 10*3/uL (ref 0.1–1.0)
Monocytes Relative: 14 %
NEUTROS ABS: 2.4 10*3/uL (ref 1.7–7.7)
NEUTROS PCT: 53 %
PLATELETS: 120 10*3/uL — AB (ref 150–400)
RBC: 2.48 MIL/uL — ABNORMAL LOW (ref 4.22–5.81)
RDW: 15.2 % (ref 11.5–15.5)
WBC: 4.7 10*3/uL (ref 4.0–10.5)

## 2017-07-30 LAB — RENAL FUNCTION PANEL
Albumin: 2.3 g/dL — ABNORMAL LOW (ref 3.5–5.0)
Anion gap: 8 (ref 5–15)
BUN: 49 mg/dL — AB (ref 6–20)
CALCIUM: 8.6 mg/dL — AB (ref 8.9–10.3)
CHLORIDE: 108 mmol/L (ref 101–111)
CO2: 24 mmol/L (ref 22–32)
Creatinine, Ser: 3.73 mg/dL — ABNORMAL HIGH (ref 0.61–1.24)
GFR, EST AFRICAN AMERICAN: 20 mL/min — AB (ref >60)
GFR, EST NON AFRICAN AMERICAN: 17 mL/min — AB (ref >60)
Glucose, Bld: 92 mg/dL (ref 65–99)
Phosphorus: 5 mg/dL — ABNORMAL HIGH (ref 2.5–4.6)
Potassium: 4.8 mmol/L (ref 3.5–5.1)
SODIUM: 140 mmol/L (ref 135–145)

## 2017-07-30 LAB — AEROBIC/ANAEROBIC CULTURE W GRAM STAIN (SURGICAL/DEEP WOUND): Culture: NO GROWTH

## 2017-07-30 MED ORDER — FUROSEMIDE 10 MG/ML IJ SOLN
40.0000 mg | Freq: Once | INTRAMUSCULAR | Status: AC
  Administered 2017-07-30: 40 mg via INTRAVENOUS
  Filled 2017-07-30: qty 4

## 2017-07-30 NOTE — Progress Notes (Addendum)
PROGRESS NOTE    Glenn Alvarado  OMB:[id number] DOB: 11-05-1965 DOA: 07/22/2017 PCP: Harvie Junior, MD  Brief Narrative:52 year old male with history of coronary artery disease, hypertension, hyperlipidemia, COPD, depression, anxiety, substance abuse disorder was admitted to [facility] few days ago with acute encephalopathy and questionable suicidal ideation. In the ED at [facility] he was noted to have leukocytosis, renal failure, anion gap metabolic acidosis, elevated bilirubin and AST as well as worsening anemia from his baseline. Urine drug screen was positive for amphetamines and benzodiazepines. Following admission patient improved, his encephalopathy resolved. Due to concern for suicidal ideation psychiatry was consulted and the patient was cleared from psychiatry standpoint. He incidentally was noted to have 1/4 blood cultures positive for MRSA. He has also been complaining of worsening left shoulder pain and underwent the plain imaging as well as a CT scan which was concerning for osteomyelitis of the glenoid humeral joint. Orthopedics were consulted and recommended transfer to a tertiary center and he was moved to [facility] orthopedic surgery and ID were consulted. Underwent an MRI which was positive for septic arthritis and he was taken to the operating room on 5/21      Assessment & Plan:   Principal Problem:   MRSA bacteremia Active Problems:   CAD (coronary artery disease)   Depression with anxiety   Essential hypertension   GERD (gastroesophageal reflux disease)   HLD (hyperlipidemia)   Substance abuse ([place])   AKI (acute kidney injury) ([place])   Pancytopenia ([place])   Acute metabolic encephalopathy   Septic joint of left shoulder region ([place])   Left shoulder pain   Abnormal LFTs   Tobacco abuse   COPD (chronic obstructive pulmonary disease) ([name])   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 Repeat blood cultures negative. Currently on daptomycin for 4 more weeks till June 20. Echo showed no evidence of endocarditis. This patient was  transferred here from [facility] morphine.  IV team consulted for tunneled catheter placement for IV antibiotics.  Will  2]AKI on CKD 3Nephrology following. Renal ultrasound no obstruction.   3]history of cirrhosis of the liver/hepatitis B followed at [facility].  4]substance abuse disorder-patient is very upset that the Xanax has been decreased. Continue Celexa.      DVT prophylaxis:scd Code Status: full Family Communication:none Disposition Plan tbd Consultants: Ortho, ID, renal   Procedures: Left shoulder irrigation and debridement 07/25/2017 Antimicrobials: Daptomycin Subjective: Several complaints mostly relating to pain and pain medications.  Concern about lower extremity swelling  Objective: Vitals:   07/29/17 1355 07/29/17 1800 07/29/17 2225 07/30/17 0446  BP: 119/63 117/71 118/74 103/63  Pulse: 79 97 84 96  Resp: 18 18 19 19   Temp: 98.1 F (36.7 C) 97.7 F (36.5 C) 98 F (36.7 C) 98.1 F (36.7 C)  TempSrc: Oral Oral Oral Oral  SpO2: 99% 100% 99% 100%  Weight:    75.4 kg (166 lb 3.6 oz)  Height:        Intake/Output Summary (Last 24 hours) at 07/30/2017 1117 Last data filed at 07/30/2017 0447 Gross per 24 hour  Intake 1160 ml  Output 600 ml  Net 560 ml   Filed Weights   07/25/17 0947 07/29/17 0650 07/30/17 0446  Weight: 77.3 kg (170 lb 6.7 oz) 76.5 kg (168 lb 10.4 oz) 75.4 kg (166 lb 3.6 oz)    Examination:  General exam: Appears  calm and comfortable  Respiratory system: Clear to auscultation. Respiratory effort normal. Cardiovascular system: S1 & S2 heard, RRR. No JVD, murmurs, rubs, gallops or clicks. No pedal edema. Gastrointestinal system: Abdomen is nondistended, soft  and nontender. No organomegaly or masses felt. Normal bowel sounds heard. Central nervous system: Alert and oriented. No focal neurological deficits. Extremities: Symmetric 5 x 5 power. Skin: No rashes, lesions or ulcers Psychiatry: Judgement and insight appear normal. Mood & affect appropriate.     Data Reviewed: I have personally reviewed following labs and imaging studies  CBC: Recent Labs  Lab 07/26/17 0506 07/27/17 0520 07/28/17 0515 07/29/17 0344 07/30/17 0439  WBC 5.6 6.3 5.6 5.9 4.7  NEUTROABS 3.0 3.6 2.9 3.0 2.4  HGB 7.3* 7.6* 8.2* 8.1* 8.0*  HCT 22.8* 23.1* 24.9* 24.9* 24.5*  MCV 100.0 97.9 97.3 97.3 98.8  PLT 84* 108* 116* 132* 778*   Basic Metabolic Panel: Recent Labs  Lab 07/26/17 0506 07/27/17 0520 07/28/17 0515 07/29/17 0344 07/30/17 0439  NA 137 140 140 139  138 140  K 4.5 5.1 5.0 4.9  4.9 4.8  CL 111 111 110 109  108 108  CO2 20* 21* 23 23  22 24   GLUCOSE 109* 118* 90 90  86 92  BUN 36* 41* 46* 45*  45* 49*  CREATININE 3.07* 3.34* 3.46* 3.59*  3.63* 3.73*  CALCIUM 7.9* 8.3* 8.6* 8.5*  8.4* 8.6*  PHOS  --   --  4.4 4.6 5.0*   GFR: Estimated Creatinine Clearance: 22.9 mL/min (A) (by C-G formula based on SCr of 3.73 mg/dL (H)). Liver Function Tests: Recent Labs  Lab 07/28/17 0515 07/29/17 0344 07/30/17 0439  ALBUMIN 2.3* 2.4* 2.3*   No results for input(s): LIPASE, AMYLASE in the last 168 hours. No results for input(s): AMMONIA in the last 168 hours. Coagulation Profile: No results for input(s): INR, PROTIME in the last 168 hours. Cardiac Enzymes: Recent Labs  Lab 07/25/17 0426  CKTOTAL 110   BNP (last 3 results) No results for input(s): PROBNP in the last 8760 hours. HbA1C: No results for input(s): HGBA1C in the last 72 hours. CBG: Recent Labs  Lab 07/26/17 0806 07/28/17 0752 07/28/17 1257 07/29/17 0839 07/30/17 0751  GLUCAP 108* 90 119* 92 87   Lipid Profile: No results for input(s): CHOL, HDL, LDLCALC, TRIG,  CHOLHDL, LDLDIRECT in the last 72 hours. Thyroid Function Tests: No results for input(s): TSH, T4TOTAL, FREET4, T3FREE, THYROIDAB in the last 72 hours. Anemia Panel: No results for input(s): VITAMINB12, FOLATE, FERRITIN, TIBC, IRON, RETICCTPCT in the last 72 hours. Sepsis Labs: No results for input(s): PROCALCITON, LATICACIDVEN in the last 168 hours.  Recent Results (from the past 240 hour(s))  Culture, blood (Routine X 2) w Reflex to ID Panel     Status: None   Collection Time: 07/23/17  3:59 AM  Result Value Ref Range Status   Specimen Description BLOOD RIGHT HAND  Final   Special Requests   Final    BOTTLES DRAWN AEROBIC AND ANAEROBIC Blood Culture results may not be optimal due to an inadequate volume of blood received in culture bottles   Culture   Final    NO GROWTH 5 DAYS Performed at [facility], [place] [address], [place], [place] [address]    Report Status 07/28/2017 FINAL  Final  Culture, blood (Routine X 2) w Reflex to ID Panel     Status: None   Collection Time: 07/23/17  4:19 AM  Result Value Ref Range  Status   Specimen Description BLOOD LEFT HAND  Final   Special Requests   Final    BOTTLES DRAWN AEROBIC AND ANAEROBIC Blood Culture results may not be optimal due to an inadequate volume of blood received in culture bottles   Culture   Final    NO GROWTH 5 DAYS Performed at [facility], [place] [address], [place], [place] [address]    Report Status 07/28/2017 FINAL  Final  Body fluid culture     Status: None   Collection Time: 07/23/17  6:10 PM  Result Value Ref Range Status   Specimen Description FLUID SYNOVIAL SHOULDER  Final   Special Requests NONE  Final   Gram Stain   Final    FEW WBC PRESENT,BOTH PMN AND MONONUCLEAR NO ORGANISMS SEEN    Culture   Final    NO GROWTH 3 DAYS Performed at [facility], [place] [address] [place], [place] [address]    Report Status 07/27/2017 FINAL  Final  Surgical pcr screen     Status: Abnormal   Collection  Time: 07/24/17 11:47 AM  Result Value Ref Range Status   MRSA, PCR NEGATIVE NEGATIVE Final   Staphylococcus aureus POSITIVE (A) NEGATIVE Final    Comment: (NOTE) The Xpert SA Assay (FDA approved for NASAL specimens in patients 65 years of age and older), is one component of a comprehensive surveillance program. It is not intended to diagnose infection nor to guide or monitor treatment. Performed at [facility], [place] [address], [place], [place] [address]   Aerobic/Anaerobic Culture (surgical/deep wound)     Status: None (Preliminary result)   Collection Time: 07/25/17 11:03 AM  Result Value Ref Range Status   Specimen Description TISSUE LEFT SHOULDER SPEC 1  Final   Special Requests PATIENT ON FOLLOWING ANCEF  Final   Gram Stain   Final    FEW WBC PRESENT, PREDOMINANTLY PMN NO ORGANISMS SEEN    Culture   Final    NO GROWTH 4 DAYS NO ANAEROBES ISOLATED; CULTURE IN PROGRESS FOR 5 DAYS Performed at [facility], [place] [address], [place], [place] [address]    Report Status PENDING  Incomplete  Aerobic/Anaerobic Culture (surgical/deep wound)     Status: None (Preliminary result)   Collection Time: 07/25/17 11:05 AM  Result Value Ref Range Status   Specimen Description TISSUE LEFT SHOULDER SPEC 2  Final   Special Requests PATIENT ON FOLLOWING ANCEF  Final   Gram Stain   Final    FEW WBC PRESENT, PREDOMINANTLY PMN NO ORGANISMS SEEN    Culture   Final    NO GROWTH 4 DAYS NO ANAEROBES ISOLATED; CULTURE IN PROGRESS FOR 5 DAYS Performed at [facility], [place] [address], [place], [place] [address]    Report Status PENDING  Incomplete         Radiology Studies: No results found.      Scheduled Meds: . ALPRAZolam  0.5 mg Oral Daily  . citalopram  10 mg Oral Daily  . darbepoetin (ARANESP) injection - NON-DIALYSIS  100 mcg Subcutaneous Q Thu-1800  . feeding supplement (ENSURE ENLIVE)  237 mL Oral BID BM  . lidocaine (PF)  5 mL Other Once  . nicotine  21 mg  Transdermal Daily  . NIFEdipine  30 mg Oral Daily  . tenofovir  300 mg Oral Q72H   Continuous Infusions: . sodium chloride 10 mL/hr at 07/24/17 1248  . sodium chloride 10 mL/hr at  07/25/17 1830  . DAPTOmycin (CUBICIN)  IV Stopped (07/28/17 1210)     LOS: 8 days     Georgette Shell, MD [facility]  If 7PM-7AM, please contact night-coverage [email or web address] Password [facility] 07/30/2017, 11:17 AM

## 2017-07-30 NOTE — Progress Notes (Signed)
[place] [facility] for Infectious Disease    Date of Admission:  07/22/2017   Total days of antibiotics 9          ID: Wilbon Obenchain is a 52 y.o. 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 ALPRAZolam  0.5 mg Oral Daily  . citalopram  10 mg Oral Daily  . darbepoetin (ARANESP) injection - NON-DIALYSIS  100 mcg Subcutaneous Q Thu-1800  . feeding supplement (ENSURE ENLIVE)  237 mL Oral BID BM  . lidocaine (PF)  5 mL Other Once  . nicotine  21 mg Transdermal Daily  . NIFEdipine  30 mg Oral Daily    Objective: Vital signs in last 24 hours: Temp:  [97.7 F (36.5 C)-98.1 F (36.7 C)] 98.1 F (36.7 C) (05/27 0446) Pulse Rate:  [84-97] 96 (05/27 0446) Resp:  [18-19] 19 (05/27 0446) BP: (103-118)/(63-74) 103/63 (05/27 0446) SpO2:  [99 %-100 %] 100 % (05/27 0446) Weight:  [166 lb 3.6 oz (75.4 kg)] 166 lb 3.6 oz (75.4 kg) (05/27 0446) Physical Exam  Constitutional: He is oriented to person, place, and time. He appears well-developed and well-nourished. No distress.  HENT:  Mouth/Throat: Oropharynx is clear and moist. No oropharyngeal exudate.  Cardiovascular: Normal rate, regular rhythm and normal heart sounds. Exam reveals no gallop and no friction rub.  No murmur heard.  Pulmonary/Chest: Effort  normal and breath sounds normal. No respiratory distress. He has no wheezes.  Abdominal: Soft. Bowel sounds are normal. He exhibits no distension. There is no tenderness.  Ext: +2 edema to LE Bilaterally Skin: Skin is warm and dry. No rash noted. No erythema.  Psychiatric: He has a normal mood and affect. His behavior is normal.     Lab Results Recent Labs    07/29/17 0344 07/30/17 0439  WBC 5.9 4.7  HGB 8.1* 8.0*  HCT 24.9* 24.5*  NA 139  138 140  K 4.9  4.9 4.8  CL 109  108 108  CO2 23  22 24   BUN 45*  45* 49*  CREATININE 3.59*  3.63* 3.73*   Liver Panel Recent Labs    07/29/17 0344 07/30/17 0439  ALBUMIN 2.4* 2.3*   Lab Results  Component Value Date   ESRSEDRATE 99 (H) 07/23/2017    Microbiology: 5/22 blood cx ngtd 5/20 blood cx ngtd 5/20 synovial fluid cx ngtd Studies/Results: No results found.   Assessment/Plan: MRSA bacteremia and septic arthritis = plan to treat for 4 weeks using daptomycin at 18m/kg/renally dosed using 5/20 as day 1  When he is ready for SNF placement, can place line, will need IR to do IJ/[place] chest.  AKI = thought to be polymicrobial suspect vancomycin toxicity. We will temporarily stop tenofovir to see if any improvement. Cr not hit  nadir yet  Chronic Hepatitis B = he had been on entecavir up until his hospitalization. He was switched at this hospitalization to renally dosed tenofovir since entecavir is not available. His [name] VL Is 60. We will watch for his LFT every 2 days to see if any elevations in ALT/AST to suggest [name] flare.    [facility] Infectious Diseases Cell: [phone] Pager: [phone]  07/30/2017, 3:24 PM

## 2017-07-30 NOTE — Progress Notes (Signed)
Physical Therapy Treatment Patient Details Name: Glenn Alvarado MRN: [id number] DOB: Sep 08, 1965 Today's Date: 07/30/2017    History of Present Illness Glenn Alvarado is a 52yo white male who comes to [facility] on 5/22 after 3 weeks shoulder pain, now presenting s/p Left  GHJ I&D. 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     PT Comments    Pt tolerating increased ambulation distance but continues with slow speed with little ability to change. Shaking noted which increases after 500'. Worked on improving balance during gait with bkwd walking and high stepping. PT will continue to follow.   Follow Up Recommendations  SNF(for IV ABX)     Equipment Recommendations  Cane    Recommendations for Other Services       Precautions / Restrictions Precautions Precautions: None Required Braces or Orthoses: Sling("For comfort" per ortho note) Restrictions Weight Bearing Restrictions: Yes LUE Weight Bearing: Weight bearing as tolerated    Mobility  Bed Mobility               General bed mobility comments: received sitting EOB   Transfers Overall transfer level: Needs assistance Equipment used: None Transfers: Sit to/from Stand Sit to Stand: Supervision         General transfer comment: safely and slowly, with caution and mild shaking  Ambulation/Gait Ambulation/Gait assistance: Supervision Ambulation Distance (Feet): 1000 Feet Assistive device: None Gait Pattern/deviations: Step-through pattern;Decreased stride length Gait velocity: decreased Gait velocity interpretation: 1.31 - 2.62 ft/sec, indicative of limited community ambulator General Gait Details: pt with no LOB but shaking increased after first 500'. Pt also with very slow gait and little ability to increase pace. Worked on bkwd walking as well as high knee walking for balance.    Stairs             Wheelchair Mobility     Modified Rankin (Stroke Patients Only)       Balance Overall balance assessment: Mild deficits observed, not formally tested                                          Cognition Arousal/Alertness: Awake/alert Behavior During Therapy: WFL for tasks assessed/performed Overall Cognitive Status: History of cognitive impairments - at baseline                                 General Comments: pt seems very clear today about past history but no family present to verify and pt known to confabulate. Pt continues to seem to have decreased insight into deficits. He reports that his mother currently has his daughters      Exercises      General Comments        Pertinent Vitals/Pain Pain Assessment: Faces Faces Pain Scale: Hurts little more Pain Location: R forearm Pain Descriptors / Indicators: Aching Pain Intervention(s): Limited activity within patient's tolerance    Home Living                      Prior Function            PT Goals (current goals can now be found in the care plan section) Acute Rehab PT Goals Patient Stated Goal: reduce pain return to home to be there for daughters (11, 9, 5)  PT Goal Formulation: With patient Time For Goal Achievement: 08/09/17 Potential to Achieve Goals: Good Progress towards PT goals: Progressing toward goals    Frequency    Min 2X/week      PT Plan Current plan remains appropriate    Co-evaluation              AM-PAC PT "6 Clicks" Daily Activity  Outcome Measure  Difficulty turning over in bed (including adjusting bedclothes, sheets and blankets)?: A Little Difficulty moving from lying on back to sitting on the side of the bed? : A Little Difficulty sitting down on and standing up from a chair with arms (e.g., wheelchair, bedside commode, etc,.)?: A Little Help needed moving to and from a bed to chair (including a wheelchair)?: A Little Help needed walking in hospital room?: A  Little Help needed climbing 3-5 steps with a railing? : A Little 6 Click Score: 18    End of Session Equipment Utilized During Treatment: Gait belt Activity Tolerance: Patient tolerated treatment well Patient left: in chair;with call bell/phone within reach Nurse Communication: Mobility status PT Visit Diagnosis: Unsteadiness on feet (R26.81);Other abnormalities of gait and mobility (R26.89);Difficulty in walking, not elsewhere classified (R26.2)     Time: 1236-1310 PT Time Calculation (min) (ACUTE ONLY): 34 min  Charges:  $Gait Training: 23-37 mins                    G Codes:       [place] 07/30/2017, 2:22 PM

## 2017-07-30 NOTE — Progress Notes (Signed)
  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 Original presentation to [facility] was with AMS 2/2 metabolic encephalopathy/polysubstance abuse. Found to have AKI, labs from 2015 showed creatinine of 1.2-1.3,  but more recent records in March, 2019 was 0.9. Was 3.3 on adm to [facility], 2.7 at time of transfer here, rising since 5/19 admission, we were asked to see on 5/23. No prior AKI, no NSAIDS, renal US non-obstr stones w/13.8, 12.9 cm echodense kidneys, UA neg for protein but large blood, 21-50 RBC's. Normal C3 C4, neg HIV   Assessment/Recommendations  1. AKI on CKD stage III (baseline 0.9-1.3)Suspect multifactorial in the setting of ongoing tenofovir use, possible volume depletion with poor oral intake with nausea/vomiting, ifection. Neg fluid balance at time of initial consultation   Will dose with lasix x 1 for edema  2. Left shoulder septic arthritis/MRSA bacteremia: S/P arthroscopic irrigation and debridement. Dapto until June 20. SNF planned for ATB administration 3. Hypertension: Controlled. 4. Anemia: Secondary to chronic illness as well as iron deficiency.  1. Active infection at this time precludes intravenous iron 2. Aranesp 100 started 5/23 5. Chronic hepatitis B and C infections: Ongoing management with tenofovir, reports that he will undergo treatment for hepatitis C once he completes his tenofovir of course. Tenofovir appears to be appropriately renally dosed at this time.  6. Polysubstance abuse   Subjective: Interval History: Still some leg swelling  Objective: Vital signs in last 24 hours: Temp:  [97.7 F (36.5 C)-98.1 F (36.7 C)] 98.1 F (36.7 C) (05/27 0446) Pulse Rate:  [84-97] 96 (05/27 0446) Resp:  [18-19] 19 (05/27 0446) BP: (103-118)/(63-74) 103/63 (05/27 0446) SpO2:  [99 %-100 %] 100 %  (05/27 0446) Weight:  [75.4 kg (166 lb 3.6 oz)] 75.4 kg (166 lb 3.6 oz) (05/27 0446) Weight change: -1.1 kg (-2 lb 6.8 oz)  Intake/Output from previous day: 05/26 0701 - 05/27 0700 In: 1160 [P.O.:1160] Out: 1200 [Urine:1200] Intake/Output this shift: No intake/output data recorded.  General appearance: alert and cooperative Back: negative, symmetric, no curvature. ROM normal. No CVA tenderness. Resp: clear to auscultation bilaterally Extremities: edema 2+  Lab Results: Recent Labs    07/29/17 0344 07/30/17 0439  WBC 5.9 4.7  HGB 8.1* 8.0*  HCT 24.9* 24.5*  PLT 132* 120*   BMET:  Recent Labs    07/29/17 0344 07/30/17 0439  NA 139  138 140  K 4.9  4.9 4.8  CL 109  108 108  CO2 23  22 24   GLUCOSE 90  86 92  BUN 45*  45* 49*  CREATININE 3.59*  3.63* 3.73*  CALCIUM 8.5*  8.4* 8.6*   No results for input(s): PTH in the last 72 hours. Iron Studies: No results for input(s): IRON, TIBC, TRANSFERRIN, FERRITIN in the last 72 hours. Studies/Results: No results found.  Scheduled: . ALPRAZolam  0.5 mg Oral Daily  . citalopram  10 mg Oral Daily  . darbepoetin (ARANESP) injection - NON-DIALYSIS  100 mcg Subcutaneous Q Thu-1800  . feeding supplement (ENSURE ENLIVE)  237 mL Oral BID BM  . lidocaine (PF)  5 mL Other Once  . nicotine  21 mg Transdermal Daily  . NIFEdipine  30 mg Oral Daily  . tenofovir  300 mg Oral Q72H     LOS: 8 days   Estanislado Emms 07/30/2017,2:48 PM

## 2017-07-31 LAB — PROTEIN ELECTROPHORESIS, SERUM
A/G Ratio: 0.6 — ABNORMAL LOW (ref 0.7–1.7)
ALBUMIN ELP: 2.5 g/dL — AB (ref 2.9–4.4)
ALPHA-1-GLOBULIN: 0.4 g/dL (ref 0.0–0.4)
ALPHA-2-GLOBULIN: 0.6 g/dL (ref 0.4–1.0)
BETA GLOBULIN: 1 g/dL (ref 0.7–1.3)
GAMMA GLOBULIN: 2.4 g/dL — AB (ref 0.4–1.8)
Globulin, Total: 4.4 g/dL — ABNORMAL HIGH (ref 2.2–3.9)
Total Protein ELP: 6.9 g/dL (ref 6.0–8.5)

## 2017-07-31 LAB — RENAL FUNCTION PANEL
ANION GAP: 11 (ref 5–15)
Albumin: 2.7 g/dL — ABNORMAL LOW (ref 3.5–5.0)
BUN: 52 mg/dL — ABNORMAL HIGH (ref 6–20)
CALCIUM: 9 mg/dL (ref 8.9–10.3)
CHLORIDE: 105 mmol/L (ref 101–111)
CO2: 24 mmol/L (ref 22–32)
Creatinine, Ser: 3.94 mg/dL — ABNORMAL HIGH (ref 0.61–1.24)
GFR calc non Af Amer: 16 mL/min — ABNORMAL LOW (ref >60)
GFR, EST AFRICAN AMERICAN: 19 mL/min — AB (ref >60)
Glucose, Bld: 89 mg/dL (ref 65–99)
POTASSIUM: 4.8 mmol/L (ref 3.5–5.1)
Phosphorus: 5.8 mg/dL — ABNORMAL HIGH (ref 2.5–4.6)
Sodium: 140 mmol/L (ref 135–145)

## 2017-07-31 LAB — CBC
HCT: 27.1 % — ABNORMAL LOW (ref 39.0–52.0)
Hemoglobin: 8.9 g/dL — ABNORMAL LOW (ref 13.0–17.0)
MCH: 31.9 pg (ref 26.0–34.0)
MCHC: 32.8 g/dL (ref 30.0–36.0)
MCV: 97.1 fL (ref 78.0–100.0)
Platelets: 156 10*3/uL (ref 150–400)
RBC: 2.79 MIL/uL — AB (ref 4.22–5.81)
RDW: 15.1 % (ref 11.5–15.5)
WBC: 5.9 10*3/uL (ref 4.0–10.5)

## 2017-07-31 LAB — KAPPA/LAMBDA LIGHT CHAINS
KAPPA, LAMDA LIGHT CHAIN RATIO: 1.89 — AB (ref 0.26–1.65)
Kappa free light chain: 248.2 mg/L — ABNORMAL HIGH (ref 3.3–19.4)
Lambda free light chains: 131.1 mg/L — ABNORMAL HIGH (ref 5.7–26.3)

## 2017-07-31 LAB — GLUCOSE, CAPILLARY: GLUCOSE-CAPILLARY: 88 mg/dL (ref 65–99)

## 2017-07-31 NOTE — Progress Notes (Addendum)
PROGRESS NOTE    Glenn Alvarado  KYH:[id number] DOB: 1965-04-13 DOA: 07/22/2017 PCP: Harvie Junior, MD  Brief Narrative:52 year old male with history of coronary artery disease, hypertension, hyperlipidemia, COPD, depression, anxiety, substance abuse disorder was admitted to [facility] few days ago with acute encephalopathy and questionable suicidal ideation. In the ED at [facility] he was noted to have leukocytosis, renal failure, anion gap metabolic acidosis, elevated bilirubin and AST as well as worsening anemia from his baseline. Urine drug screen was positive for amphetamines and benzodiazepines. Following admission patient improved, his encephalopathy resolved. Due to concern for suicidal ideation psychiatry was consulted and the patient was cleared from psychiatry standpoint. He incidentally was noted to have 1/4 blood cultures positive for MRSA. He has also been complaining of worsening left shoulder pain and underwent the plain imaging as well as a CT scan which was concerning for osteomyelitis of the glenoid humeral joint. Orthopedics were consulted and recommended transfer to a tertiary center and he was moved to [facility] orthopedic surgery and ID were consulted. Underwent an MRI which was positive for septic arthritis and he was taken to the operating room on 5/21     Assessment & Plan:   Principal Problem:   MRSA bacteremia Active Problems:   CAD (coronary artery disease)   Depression with anxiety   Essential hypertension   GERD (gastroesophageal reflux disease)   HLD (hyperlipidemia)   Substance abuse ([place])   AKI (acute kidney injury) ([place])   Pancytopenia ([place])   Acute metabolic encephalopathy   Septic joint of left shoulder region ([place])   Left shoulder pain   Abnormal LFTs   Tobacco abuse   COPD (chronic obstructive pulmonary disease) ([name])   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 Repeat blood cultures negative. Currently on daptomycin for 4 more weeks till June 20. Echo showed no evidence of endocarditis. This patient was  transferred here from [facility] morphine. 2]AKI on CKD 3Nephrology following. Renal ultrasound no obstruction. noted tenofevir stopped to see improvement in aki  3]history of cirrhosis of the liver/hepatitis B followed at [facility].  4]substance abuse disorder-patient is very upset that the Xanax has been decreased. Continue Celexa.      DVT prophylaxis:scd Code Status: full Family Communication: none Disposition Plantbd   Consultants: Ortho, ID, renal.IR consulted for iv access      Procedures:  Left shoulder irrigation and debridement 07/25/2017   Antimicrobials: : Daptomycin    Subjective:same complaints wants more pain meds.i saw him later walking with therapy smiling .Marland Kitchen   Objective: Vitals:   07/30/17 0446 07/30/17 1619 07/30/17 2225 07/31/17 0522  BP: 103/63 113/73 (!) 109/54 128/75  Pulse: 96 94 86 92  Resp: 19  16 19   Temp: 98.1 F (36.7 C) 98.1 F (36.7 C) 98.4 F (36.9 C) 98.2 F (36.8 C)  TempSrc: Oral Oral Oral Oral  SpO2: 100% 97% 98% 99%  Weight: 75.4 kg (166 lb 3.6 oz)   74.1 kg (163 lb 5.8 oz)  Height:        Intake/Output Summary (Last 24 hours) at 07/31/2017 1330 Last data filed at 07/31/2017 0900 Gross per 24 hour  Intake 480 ml  Output 2950 ml  Net -2470 ml   Filed Weights   07/29/17 0650 07/30/17 0446 07/31/17 0522  Weight: 76.5 kg (168 lb 10.4 oz) 75.4 kg (166 lb 3.6 oz) 74.1  kg (163 lb 5.8 oz)    Examination:  General exam: Appears calm and comfortable  Respiratory system: Clear to auscultation. Respiratory effort normal. Cardiovascular system: S1 & S2 heard, RRR. No JVD, murmurs, rubs, gallops or clicks. No pedal edema. Gastrointestinal system: Abdomen is  nondistended, soft and nontender. No organomegaly or masses felt. Normal bowel sounds heard. Central nervous system: Alert and oriented. No focal neurological deficits. Extremities: Symmetric 5 x 5 power. Skin: No rashes, lesions or ulcers Psychiatry: Judgement and insight appear normal. Mood & affect appropriate.     Data Reviewed: I have personally reviewed following labs and imaging studies  CBC: Recent Labs  Lab 07/26/17 0506 07/27/17 0520 07/28/17 0515 07/29/17 0344 07/30/17 0439 07/31/17 0528  WBC 5.6 6.3 5.6 5.9 4.7 5.9  NEUTROABS 3.0 3.6 2.9 3.0 2.4  --   HGB 7.3* 7.6* 8.2* 8.1* 8.0* 8.9*  HCT 22.8* 23.1* 24.9* 24.9* 24.5* 27.1*  MCV 100.0 97.9 97.3 97.3 98.8 97.1  PLT 84* 108* 116* 132* 120* 269   Basic Metabolic Panel: Recent Labs  Lab 07/27/17 0520 07/28/17 0515 07/29/17 0344 07/30/17 0439 07/31/17 0528  NA 140 140 139  138 140 140  K 5.1 5.0 4.9  4.9 4.8 4.8  CL 111 110 109  108 108 105  CO2 21* 23 23  22 24 24   GLUCOSE 118* 90 90  86 92 89  BUN 41* 46* 45*  45* 49* 52*  CREATININE 3.34* 3.46* 3.59*  3.63* 3.73* 3.94*  CALCIUM 8.3* 8.6* 8.5*  8.4* 8.6* 9.0  PHOS  --  4.4 4.6 5.0* 5.8*   GFR: Estimated Creatinine Clearance: 21.7 mL/min (A) (by C-G formula based on SCr of 3.94 mg/dL (H)). Liver Function Tests: Recent Labs  Lab 07/28/17 0515 07/29/17 0344 07/30/17 0439 07/31/17 0528  ALBUMIN 2.3* 2.4* 2.3* 2.7*   No results for input(s): LIPASE, AMYLASE in the last 168 hours. No results for input(s): AMMONIA in the last 168 hours. Coagulation Profile: No results for input(s): INR, PROTIME in the last 168 hours. Cardiac Enzymes: Recent Labs  Lab 07/25/17 0426  CKTOTAL 110   BNP (last 3 results) No results for input(s): PROBNP in the last 8760 hours. HbA1C: No results for input(s): HGBA1C in the last 72 hours. CBG: Recent Labs  Lab 07/28/17 0752 07/28/17 1257 07/29/17 0839 07/30/17 0751 07/31/17 0754  GLUCAP 90 119* 92 87 88     Lipid Profile: No results for input(s): CHOL, HDL, LDLCALC, TRIG, CHOLHDL, LDLDIRECT in the last 72 hours. Thyroid Function Tests: No results for input(s): TSH, T4TOTAL, FREET4, T3FREE, THYROIDAB in the last 72 hours. Anemia Panel: No results for input(s): VITAMINB12, FOLATE, FERRITIN, TIBC, IRON, RETICCTPCT in the last 72 hours. Sepsis Labs: No results for input(s): PROCALCITON, LATICACIDVEN in the last 168 hours.  Recent Results (from the past 240 hour(s))  Culture, blood (Routine X 2) w Reflex to ID Panel     Status: None   Collection Time: 07/23/17  3:59 AM  Result Value Ref Range Status   Specimen Description BLOOD RIGHT HAND  Final   Special Requests   Final    BOTTLES DRAWN AEROBIC AND ANAEROBIC Blood Culture results may not be optimal due to an inadequate volume of blood received in culture bottles   Culture   Final    NO GROWTH 5 DAYS Performed at [facility], [place] [address] [place], [place] [address]    Report Status 07/28/2017 FINAL  Final  Culture, blood (Routine X  2) w Reflex to ID Panel     Status: None   Collection Time: 07/23/17  4:19 AM  Result Value Ref Range Status   Specimen Description BLOOD LEFT HAND  Final   Special Requests   Final    BOTTLES DRAWN AEROBIC AND ANAEROBIC Blood Culture results may not be optimal due to an inadequate volume of blood received in culture bottles   Culture   Final    NO GROWTH 5 DAYS Performed at [facility], [place] [address], [place], [place] [address]    Report Status 07/28/2017 FINAL  Final  Body fluid culture     Status: None   Collection Time: 07/23/17  6:10 PM  Result Value Ref Range Status   Specimen Description FLUID SYNOVIAL SHOULDER  Final   Special Requests NONE  Final   Gram Stain   Final    FEW WBC PRESENT,BOTH PMN AND MONONUCLEAR NO ORGANISMS SEEN    Culture   Final    NO GROWTH 3 DAYS Performed at [facility], [place] [address], [place], [place] [address]    Report Status 07/27/2017  FINAL  Final  Surgical pcr screen     Status: Abnormal   Collection Time: 07/24/17 11:47 AM  Result Value Ref Range Status   MRSA, PCR NEGATIVE NEGATIVE Final   Staphylococcus aureus POSITIVE (A) NEGATIVE Final    Comment: (NOTE) The Xpert SA Assay (FDA approved for NASAL specimens in patients 85 years of age and older), is one component of a comprehensive surveillance program. It is not intended to diagnose infection nor to guide or monitor treatment. Performed at [facility], [place] [address], [place], [place] [address]   Aerobic/Anaerobic Culture (surgical/deep wound)     Status: None   Collection Time: 07/25/17 11:03 AM  Result Value Ref Range Status   Specimen Description TISSUE LEFT SHOULDER SPEC 1  Final   Special Requests PATIENT ON FOLLOWING ANCEF  Final   Gram Stain   Final    FEW WBC PRESENT, PREDOMINANTLY PMN NO ORGANISMS SEEN    Culture   Final    No growth aerobically or anaerobically. Performed at [facility], [place] [address], [place], [place] [address]    Report Status 07/30/2017 FINAL  Final  Aerobic/Anaerobic Culture (surgical/deep wound)     Status: None   Collection Time: 07/25/17 11:05 AM  Result Value Ref Range Status   Specimen Description TISSUE LEFT SHOULDER SPEC 2  Final   Special Requests PATIENT ON FOLLOWING ANCEF  Final   Gram Stain   Final    FEW WBC PRESENT, PREDOMINANTLY PMN NO ORGANISMS SEEN    Culture   Final    No growth aerobically or anaerobically. Performed at [facility], [place] [address] [place], [place] [address]    Report Status 07/30/2017 FINAL  Final         Radiology Studies: No results found.      Scheduled Meds: . ALPRAZolam  0.5 mg Oral Daily  . citalopram  10 mg Oral Daily  . darbepoetin (ARANESP) injection - NON-DIALYSIS  100 mcg Subcutaneous Q Thu-1800  . feeding supplement (ENSURE ENLIVE)  237 mL Oral BID BM  . lidocaine (PF)  5 mL Other Once  . nicotine  21 mg Transdermal Daily  .  NIFEdipine  30 mg Oral Daily   Continuous Infusions: . sodium chloride 10 mL/hr at 07/24/17 1248  . sodium chloride 10 mL/hr at 07/25/17  1830  . DAPTOmycin (CUBICIN)  IV Stopped (07/30/17 1236)     LOS: 9 days     Georgette Shell, MD [facility] If 7PM-7AM, please contact night-coverage [email or web address] Password [email or web address] 07/31/2017, 1:30 PM

## 2017-07-31 NOTE — Plan of Care (Signed)
  Problem: Clinical Measurements: Goal: Will remain free from infection Outcome: Progressing   Problem: Activity: Goal: Risk for activity intolerance will decrease Outcome: Progressing   Problem: Pain Managment: Goal: General experience of comfort will improve Outcome: Progressing

## 2017-07-31 NOTE — Progress Notes (Signed)
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 Original presentation to [facility] was with AMS 2/2 metabolic encephalopathy/polysubstance abuse. Found to have AKI, labs from 2015 showed creatinine of 1.2-1.3, but more recent records in March, 2019 was 0.9. Was 3.3 on adm to [facility], 2.7 at time of transfer here, rising since 5/19 admission, we were asked to see on 5/23. No prior AKI, no NSAIDS, renal US non-obstr stones w/13.8, 12.9 cm echodense kidneys, UA neg for protein but large blood, 21-50 RBC's. Normal C3 C4, neg HIV   Assessment/Recommendations  1. AKI on CKD stage III (baseline 0.9-1.3)Suspect multifactorial in the setting of ongoing tenofovir use, possible volume depletion with poor oral intake with nausea/vomiting, ifection. Neg fluid balance at time of initial consultation.  Non recovery perplexing and makes me suspect a more chronic issue and will try to review old lab data.  2. Left shoulder septic arthritis/MRSA bacteremia: S/P arthroscopic irrigation and debridement. Dapto until June 20.SNF planned for ATB administration 3. Hypertension:Controlled. 4. Anemia: Secondary to chronic illness as well as iron deficiency.  1. Active infection at this time precludes intravenous iron 2. Aranesp 100 started 5/23 5. Chronic hepatitis B and C infections: Ongoing management with tenofovir, reports that he will undergo treatment for hepatitis C once he completes his tenofovir of course. Tenofovir appears to be appropriately renally dosed at this time.  6. Polysubstance abuse   Subjective: Interval History: 2950 cc UOP yesterday!  Objective: Vital signs in last 24 hours: Temp:  [98.1 F (36.7 C)-98.4 F (36.9 C)] 98.2 F (36.8 C) (05/28 0522) Pulse Rate:  [86-94] 92 (05/28 0522) Resp:  [16-19] 19 (05/28 0522) BP:  (109-128)/(54-75) 128/75 (05/28 0522) SpO2:  [97 %-99 %] 99 % (05/28 0522) Weight:  [74.1 kg (163 lb 5.8 oz)] 74.1 kg (163 lb 5.8 oz) (05/28 0522) Weight change: -1.3 kg (-2 lb 13.9 oz)  Intake/Output from previous day: 05/27 0701 - 05/28 0700 In: -  Out: 2950 [Urine:2950] Intake/Output this shift: Total I/O In: 240 [P.O.:240] Out: -   General appearance: alert and cooperative Chest wall: no tenderness Cardio: regular rate and rhythm, S1, S2 normal, no murmur, click, rub or gallop Extremities: edema 1+  Abd full RUQ  Lab Results: Recent Labs    07/30/17 0439 07/31/17 0528  WBC 4.7 5.9  HGB 8.0* 8.9*  HCT 24.5* 27.1*  PLT 120* 156   BMET:  Recent Labs    07/30/17 0439 07/31/17 0528  NA 140 140  K 4.8 4.8  CL 108 105  CO2 24 24  GLUCOSE 92 89  BUN 49* 52*  CREATININE 3.73* 3.94*  CALCIUM 8.6* 9.0   No results for input(s): PTH in the last 72 hours. Iron Studies: No results for input(s): IRON, TIBC, TRANSFERRIN, FERRITIN in the last 72 hours. Studies/Results: No results found.  Scheduled: . ALPRAZolam  0.5 mg Oral Daily  . citalopram  10 mg Oral Daily  . darbepoetin (ARANESP) injection - NON-DIALYSIS  100 mcg Subcutaneous Q Thu-1800  . feeding supplement (ENSURE ENLIVE)  237 mL Oral BID BM  . lidocaine (PF)  5 mL Other Once  . nicotine  21 mg Transdermal Daily  . NIFEdipine  30 mg Oral Daily     LOS: 9 days   Estanislado Emms 07/31/2017,9:33 AM

## 2017-07-31 NOTE — Social Work (Signed)
CSW acknowledging care team recommendation to complete iv abx at SNF, also aware pt amenable to SNF in [place]. CSW will attempt to find SNF amenable to pt completing iv abx treatment there. This search may be complicated by pt hx. CSW will follow and update with any findings of placement.   [place], [place] Work [phone]

## 2017-07-31 NOTE — NC FL2 (Signed)
[place] LEVEL OF CARE SCREENING TOOL     IDENTIFICATION  Patient Name: Glenn Alvarado Birthdate: [birth year] Sex: male Admission Date (Current Location): 07/22/2017  [facility] and [place] Number:  [employer] and Address:  [facility] [place], [place] [address], [place], [place] [address]      Provider Number: [id number]  Attending Physician Name and Address:  Georgette Shell, MD  Relative Name and Phone Number:   Lonie Peak, sister, [phone]  Current Level of Care: Hospital Recommended Level of Care: [place] Prior Approval Number:    Date Approved/Denied:   PASRR Number:  pending  Discharge Plan: SNF    Current Diagnoses: Patient Active Problem List   Diagnosis Date Noted  . MRSA bacteremia 07/24/2017  . Hepatitis C antibody test positive 07/24/2017  . COPD (chronic obstructive pulmonary disease) ([place]) 07/23/2017  . Chronic viral hepatitis B with cirrhosis ([place]) 07/23/2017  . Tobacco abuse   . AKI (acute kidney injury) ([place]) 07/22/2017  . Pancytopenia ([place]) 07/22/2017  . Acute metabolic encephalopathy 24/82/5003  . Septic joint of left shoulder region ([place]) 07/22/2017  . Left shoulder pain 07/22/2017  . Abnormal LFTs 07/22/2017  . CAD (coronary artery disease)   . Depression with anxiety   . Essential hypertension   . GERD (gastroesophageal reflux disease)   . HLD (hyperlipidemia)   . Substance abuse ([place])     Orientation RESPIRATION BLADDER Height & Weight     Self, Time, Situation, Place  Normal Continent Weight: 163 lb 5.8 oz (74.1 kg) Height:  5' 7.99" (172.7 cm)  BEHAVIORAL SYMPTOMS/MOOD NEUROLOGICAL BOWEL NUTRITION STATUS      Continent Diet  AMBULATORY STATUS COMMUNICATION OF NEEDS Skin   Supervision Verbally Surgical wounds(sutures left shoulder (ABD pads & gauze))                       Personal Care Assistance Level of Assistance  Bathing, Feeding, Dressing Bathing  Assistance: Independent Feeding assistance: Independent Dressing Assistance: Independent     Functional Limitations Info  Sight, Hearing, Speech Sight Info: Adequate Hearing Info: Adequate Speech Info: Adequate    SPECIAL CARE FACTORS FREQUENCY                       Contractures Contractures Info: Not present    Additional Factors Info  Code Status, Allergies, Psychotropic, Isolation Precautions Code Status Info: Full Code Allergies Info: Codeine; Acetaminophen Psychotropic Info: ALPRAZolam (XANAX) tablet 0.5 mg daily PO; citalopram (CELEXA) tablet 10 mg daily PO   Isolation Precautions Info: Contact Precautions     Current Medications (07/31/2017):  This is the current hospital active medication list Current Facility-Administered Medications  Medication Dose Route Frequency Provider Last Rate Last Dose  . 0.9 %  sodium chloride infusion   Intravenous Continuous Hiram Gash, MD 10 mL/hr at 07/24/17 1248    . 0.9 %  sodium chloride infusion   Intravenous Continuous Ophelia Charter T, MD 10 mL/hr at 07/25/17 1830    . acetaminophen (TYLENOL) tablet 325 mg  325 mg Oral Q6H PRN Hiram Gash, MD   325 mg at 07/28/17 2224  . albuterol (PROVENTIL) (2.5 MG/3ML) 0.083% nebulizer solution 2.5 mg  2.5 mg Nebulization Q4H PRN Ophelia Charter T, MD      . ALPRAZolam Duanne Moron) tablet 0.5 mg  0.5 mg Oral Daily Georgette Shell, MD   0.5 mg at 07/31/17 1023  . citalopram (  CELEXA) tablet 10 mg  10 mg Oral Daily Ophelia Charter T, MD   10 mg at 07/31/17 1022  . DAPTOmycin (CUBICIN) 620 mg in sodium chloride 0.9 % IVPB  620 mg Intravenous Q48H Susa Raring, [place]   Stopped at 07/30/17 1236  . Darbepoetin Alfa (ARANESP) injection 100 mcg  100 mcg Subcutaneous Q Thu-1800 Elmarie Shiley, MD   100 mcg at 07/26/17 1830  . dextromethorphan-guaiFENesin (MUCINEX DM) 30-600 MG per 12 hr tablet 1 tablet  1 tablet Oral BID PRN Hiram Gash, MD      . feeding supplement (ENSURE ENLIVE) (ENSURE ENLIVE) liquid  237 mL  237 mL Oral BID BM Griffin Basil, Dax T, MD   237 mL at 07/30/17 1430  . hydrALAZINE (APRESOLINE) injection 5 mg  5 mg Intravenous Q2H PRN Ophelia Charter T, MD      . lidocaine (PF) (XYLOCAINE) 1 % injection 5 mL  5 mL Other Once Griffin Basil, Dax T, MD      . nicotine (NICODERM CQ - dosed in mg/24 hours) patch 21 mg  21 mg Transdermal Daily Ophelia Charter T, MD   21 mg at 07/31/17 1023  . NIFEdipine (PROCARDIA-XL/ADALAT-CC/NIFEDICAL-XL) 24 hr tablet 30 mg  30 mg Oral Daily Elmarie Shiley, MD   30 mg at 07/31/17 1023  . ondansetron (ZOFRAN) injection 4 mg  4 mg Intravenous Q8H PRN Hiram Gash, MD   4 mg at 07/26/17 1617  . oxyCODONE (Oxy IR/ROXICODONE) immediate release tablet 10 mg  10 mg Oral Q4H PRN Georgette Shell, MD   10 mg at 07/31/17 1023  . pantoprazole (PROTONIX) EC tablet 40 mg  40 mg Oral Daily PRN Ophelia Charter T, MD      . polyethylene glycol (MIRALAX / GLYCOLAX) packet 17 g  17 g Oral Daily PRN Ophelia Charter T, MD      . zolpidem Lorrin Mais) tablet 5 mg  5 mg Oral QHS PRN Georgette Shell, MD   5 mg at 07/30/17 2213     Discharge Medications: Please see discharge summary for a list of discharge medications.  Relevant Imaging Results:  Relevant Lab Results:   Additional Information SS#[id number]; needs four weeks of iv abx end date 6/20  [place], [place]

## 2017-08-01 ENCOUNTER — Inpatient Hospital Stay (HOSPITAL_COMMUNITY): Payer: Medicare Other

## 2017-08-01 ENCOUNTER — Encounter (HOSPITAL_COMMUNITY): Payer: Self-pay | Admitting: Interventional Radiology

## 2017-08-01 HISTORY — PX: IR FLUORO GUIDE CV LINE RIGHT: IMG2283

## 2017-08-01 HISTORY — PX: IR US GUIDE VASC ACCESS RIGHT: IMG2390

## 2017-08-01 LAB — CBC
HCT: 25.3 % — ABNORMAL LOW (ref 39.0–52.0)
Hemoglobin: 8.3 g/dL — ABNORMAL LOW (ref 13.0–17.0)
MCH: 31.4 pg (ref 26.0–34.0)
MCHC: 32.8 g/dL (ref 30.0–36.0)
MCV: 95.8 fL (ref 78.0–100.0)
PLATELETS: 142 10*3/uL — AB (ref 150–400)
RBC: 2.64 MIL/uL — AB (ref 4.22–5.81)
RDW: 15 % (ref 11.5–15.5)
WBC: 5.9 10*3/uL (ref 4.0–10.5)

## 2017-08-01 LAB — COMPREHENSIVE METABOLIC PANEL
ALT: 30 U/L (ref 17–63)
AST: 73 U/L — AB (ref 15–41)
Albumin: 2.6 g/dL — ABNORMAL LOW (ref 3.5–5.0)
Alkaline Phosphatase: 85 U/L (ref 38–126)
Anion gap: 11 (ref 5–15)
BUN: 55 mg/dL — AB (ref 6–20)
CHLORIDE: 100 mmol/L — AB (ref 101–111)
CO2: 26 mmol/L (ref 22–32)
CREATININE: 4.06 mg/dL — AB (ref 0.61–1.24)
Calcium: 8.8 mg/dL — ABNORMAL LOW (ref 8.9–10.3)
GFR calc Af Amer: 18 mL/min — ABNORMAL LOW (ref >60)
GFR calc non Af Amer: 16 mL/min — ABNORMAL LOW (ref >60)
Glucose, Bld: 93 mg/dL (ref 65–99)
Potassium: 4.2 mmol/L (ref 3.5–5.1)
SODIUM: 137 mmol/L (ref 135–145)
Total Bilirubin: 0.6 mg/dL (ref 0.3–1.2)
Total Protein: 8.7 g/dL — ABNORMAL HIGH (ref 6.5–8.1)

## 2017-08-01 LAB — GLUCOSE, CAPILLARY: Glucose-Capillary: 108 mg/dL — ABNORMAL HIGH (ref 65–99)

## 2017-08-01 LAB — RENAL FUNCTION PANEL
ALBUMIN: 2.6 g/dL — AB (ref 3.5–5.0)
Anion gap: 12 (ref 5–15)
BUN: 55 mg/dL — ABNORMAL HIGH (ref 6–20)
CALCIUM: 8.7 mg/dL — AB (ref 8.9–10.3)
CO2: 25 mmol/L (ref 22–32)
CREATININE: 4.02 mg/dL — AB (ref 0.61–1.24)
Chloride: 100 mmol/L — ABNORMAL LOW (ref 101–111)
GFR calc non Af Amer: 16 mL/min — ABNORMAL LOW (ref >60)
GFR, EST AFRICAN AMERICAN: 19 mL/min — AB (ref >60)
Glucose, Bld: 92 mg/dL (ref 65–99)
PHOSPHORUS: 5.4 mg/dL — AB (ref 2.5–4.6)
Potassium: 4.2 mmol/L (ref 3.5–5.1)
SODIUM: 137 mmol/L (ref 135–145)

## 2017-08-01 LAB — CK: CK TOTAL: 64 U/L (ref 49–397)

## 2017-08-01 MED ORDER — LIDOCAINE HCL (PF) 1 % IJ SOLN
INTRAMUSCULAR | Status: DC | PRN
  Administered 2017-08-01: 5 mL

## 2017-08-01 MED ORDER — HEPARIN SOD (PORK) LOCK FLUSH 100 UNIT/ML IV SOLN: 500.0000 [IU] | INTRAVENOUS | Status: DC | PRN

## 2017-08-01 MED ORDER — ADULT MULTIVITAMIN W/MINERALS CH
1.0000 | ORAL_TABLET | Freq: Every day | ORAL | Status: DC
  Administered 2017-08-01 – 2017-08-19 (×19): 1 via ORAL
  Filled 2017-08-01 (×19): qty 1

## 2017-08-01 MED ORDER — LIDOCAINE HCL 1 % IJ SOLN
INTRAMUSCULAR | Status: AC
  Filled 2017-08-01: qty 20

## 2017-08-01 NOTE — Procedures (Signed)
Pre procedural Diagnosis: Poor venous access Post Procedural Diagnosis: Same  Successful placement of right IJ approach single lumen tunneled CVC with tip at the superior caval-atrial junction.    EBL: None  No immediate post procedural complication.  The PICC line is ready for immediate use.  Ronny Bacon, MD Pager #: [phone]

## 2017-08-01 NOTE — Progress Notes (Signed)
[facility] for Infectious Disease    Date of Admission:  07/22/2017   Total days of antibiotics 11        Day 9 daptomycin          ID: Glenn Alvarado is a 52 y.o. male with chronic [name]/hep, CKD, CAD, admitted for MRSA bacteremia with left shoulder septic arthritis s/p I and D Principal Problem:   MRSA bacteremia Active Problems:   CAD (coronary artery disease)   Depression with anxiety   Essential hypertension   GERD (gastroesophageal reflux disease)   HLD (hyperlipidemia)   Substance abuse ([place])   AKI (acute kidney injury) ([place])   Pancytopenia ([place])   Acute metabolic encephalopathy   Septic joint of left shoulder region ([place])   Left shoulder pain   Abnormal LFTs   Tobacco abuse   COPD (chronic obstructive pulmonary disease) ([name])   Chronic viral hepatitis B with cirrhosis ([name])   Hepatitis C antibody test positive    Subjective: Chest wall pain from central catheter placement  Medications:  . ALPRAZolam  0.5 mg Oral Daily  . citalopram  10 mg Oral Daily  . darbepoetin (ARANESP) injection - NON-DIALYSIS  100 mcg Subcutaneous Q Thu-1800  . feeding supplement (ENSURE ENLIVE)  237 mL Oral BID BM  . lidocaine (PF)  5 mL Other Once  . lidocaine      . multivitamin with minerals  1 tablet Oral Daily  . nicotine  21 mg Transdermal Daily  . NIFEdipine  30 mg Oral Daily    Objective: Vital signs in last 24 hours: Temp:  [97.3 F (36.3 C)-98.4 F (36.9 C)] 97.7 F (36.5 C) (05/29 1608) Pulse Rate:  [87-110] 110 (05/29 1608) Resp:  [15-19] 18 (05/29 1608) BP: (107-138)/(69-80) 107/70 (05/29 1608) SpO2:  [94 %-100 %] 100 % (05/29 1608) Weight:  [163 lb 12.8 oz (74.3 kg)] 163 lb 12.8 oz (74.3 kg) (05/29 0500) Physical Exam  Constitutional: He is oriented to person, place, and time. He appears well-developed and well-nourished. No distress.  HENT:  Mouth/Throat: Oropharynx is clear and moist. No oropharyngeal exudate.  Cardiovascular: Normal rate, regular rhythm  and normal heart sounds. Exam reveals no gallop and no friction rub.  No murmur heard.  Chest wall = right sided central line Pulmonary/Chest: Effort normal and breath sounds normal. No respiratory distress. He has no wheezes.  Abdominal: Soft. Bowel sounds are normal. He exhibits no distension. There is no tenderness. Large reducible umbilical hernia Ext= shoulder incision is c/d/i on left side. Trace to +1 edema on legs bilaterally Skin: Skin is warm and dry. No rash noted. No erythema.  Psychiatric: He has a normal mood and affect. His behavior is normal.    Lab Results Recent Labs    07/31/17 0528 08/01/17 0526  WBC 5.9 5.9  HGB 8.9* 8.3*  HCT 27.1* 25.3*  NA 140 137  137  K 4.8 4.2  4.2  CL 105 100*  100*  CO2 _0 BUN 52* 55*  55*  CREATININE 3.94* 4.06*  4.02*   Liver Panel Recent Labs    07/31/17 0528 08/01/17 0526  PROT  --  8.7*  ALBUMIN 2.7* 2.6*  2.6*  AST  --  73*  ALT  --  30  ALKPHOS  --  85  BILITOT  --  0.6   Lab Results  Component Value Date   ESRSEDRATE 99 (H) 07/23/2017    Microbiology: 5/20 blood cx  Studies/Results:  Ir Fluoro Guide Cv Line Right  Result Date: 08/01/2017 INDICATION: Poor venous access. Chronic renal insufficiency. Request made for placement of a jugular approach tunneled central venous catheter for durable intravenous access for prolonged antibiotic administration. EXAM: TUNNELED CENTRAL VENOUS CATHETER PLACEMENT WITH ULTRASOUND AND FLUOROSCOPIC GUIDANCE MEDICATIONS: Patient is currently admitted to the hospital and receiving intravenous antibiotics. The antibiotic was given in an appropriate time interval prior to skin puncture. ANESTHESIA/SEDATION: None FLUOROSCOPY TIME:  18 seconds (0.8 mGy) COMPLICATIONS: None immediate. PROCEDURE: Informed written consent was obtained from the patient after a discussion of the risks, benefits, and alternatives to treatment. Questions regarding the procedure were encouraged and  answered. The right neck and chest were prepped with chlorhexidine in a sterile fashion, and a sterile drape was applied covering the operative field. Maximum barrier sterile technique with sterile gowns and gloves were used for the procedure. A timeout was performed prior to the initiation of the procedure. After the overlying soft tissues were anesthetized, a small venotomy incision was created and a micropuncture kit was utilized to access the internal jugular vein. Real-time ultrasound guidance was utilized for vascular access including the acquisition of a permanent ultrasound image documenting patency of the accessed vessel. The microwire was utilized to measure appropriate catheter length. The micropuncture sheath was exchanged for a peel-away sheath over a guidewire. A 5 French single lumen tunneled central venous catheter measuring 27 cm was tunneled in a retrograde fashion from the anterior chest wall to the venotomy incision. The catheter was then placed through the peel-away sheath with tip ultimately positioned at the superior caval-atrial junction. Final catheter positioning was confirmed and documented with a spot radiographic image. The catheter aspirates and flushes normally. The catheter was flushed with appropriate volume heparin dwells. The catheter exit site was secured with a 0-Prolene retention suture. The venotomy incision was closed with Dermabond and Steri-strips. Dressings were applied. The patient tolerated the procedure well without immediate post procedural complication. FINDINGS: After catheter placement, the tip lies within the superior cavoatrial junction. The catheter aspirates and flushes normally and is ready for immediate use. IMPRESSION: Successful placement of 27 cm single lumen tunneled central venous catheter via the right internal jugular vein with tip terminating at the superior caval atrial junction. The catheter is ready for immediate use. Electronically Signed   By: Sandi Mariscal M.D.   On: 08/01/2017 10:59   Ir US Guide Vasc Access Right  Result Date: 08/01/2017 INDICATION: Poor venous access. Chronic renal insufficiency. Request made for placement of a jugular approach tunneled central venous catheter for durable intravenous access for prolonged antibiotic administration. EXAM: TUNNELED CENTRAL VENOUS CATHETER PLACEMENT WITH ULTRASOUND AND FLUOROSCOPIC GUIDANCE MEDICATIONS: Patient is currently admitted to the hospital and receiving intravenous antibiotics. The antibiotic was given in an appropriate time interval prior to skin puncture. ANESTHESIA/SEDATION: None FLUOROSCOPY TIME:  18 seconds (0.8 mGy) COMPLICATIONS: None immediate. PROCEDURE: Informed written consent was obtained from the patient after a discussion of the risks, benefits, and alternatives to treatment. Questions regarding the procedure were encouraged and answered. The right neck and chest were prepped with chlorhexidine in a sterile fashion, and a sterile drape was applied covering the operative field. Maximum barrier sterile technique with sterile gowns and gloves were used for the procedure. A timeout was performed prior to the initiation of the procedure. After the overlying soft tissues were anesthetized, a small venotomy incision was created and a micropuncture kit was utilized to access the internal jugular vein. Real-time ultrasound guidance  was utilized for vascular access including the acquisition of a permanent ultrasound image documenting patency of the accessed vessel. The microwire was utilized to measure appropriate catheter length. The micropuncture sheath was exchanged for a peel-away sheath over a guidewire. A 5 French single lumen tunneled central venous catheter measuring 27 cm was tunneled in a retrograde fashion from the anterior chest wall to the venotomy incision. The catheter was then placed through the peel-away sheath with tip ultimately positioned at the superior caval-atrial  junction. Final catheter positioning was confirmed and documented with a spot radiographic image. The catheter aspirates and flushes normally. The catheter was flushed with appropriate volume heparin dwells. The catheter exit site was secured with a 0-Prolene retention suture. The venotomy incision was closed with Dermabond and Steri-strips. Dressings were applied. The patient tolerated the procedure well without immediate post procedural complication. FINDINGS: After catheter placement, the tip lies within the superior cavoatrial junction. The catheter aspirates and flushes normally and is ready for immediate use. IMPRESSION: Successful placement of 27 cm single lumen tunneled central venous catheter via the right internal jugular vein with tip terminating at the superior caval atrial junction. The catheter is ready for immediate use. Electronically Signed   By: Sandi Mariscal M.D.   On: 08/01/2017 10:59     Assessment/Plan: MRSA bacteremia, complicated with septic arhtritis of left shoulder = plan on treating for 4 wk using 5/20 as day 1. Currently on day 10 of 28. He has Acute on CKD, now on daptomycin at 64m/kg QOD to finish out course of treatment. Will need weekly CK. Can have central line removed after he finishes his MRSA treatment  Acute on chronic CKD = being followed by nephrology during this hospitalization. Cr may have hit nadir. Continue to follow Cr.  Chronic [name] = he was previously on entecavir, and briefly on tenofovir-renally dosed though I don't think it caused much of his renal dysfunction. Due to concern for having a [name] flare while off of antivirals, I recommend we place him back on [name] treatment with TAF which you can uKoreaif CrCl is above 15. We will start him on TAF(vimlidy) on Friday. This should be the drug of choice for him instead of entecavir from now on.  Chronic hep C = defer treatment at this time, await to address as an outpatient  Will sign off.  Will arrange for  him to follow up in the ID clinic in 2-3 wk  [facility] Cell: [phone]: [phone]  08/01/2017, 6:37 PM

## 2017-08-01 NOTE — Social Work (Signed)
Pt currently still without SNF offers. Will continue to follow.   Alexander Mt, [place] Work [phone]

## 2017-08-01 NOTE — Progress Notes (Signed)
52 year old Caucasian man PMH CADm HTN, HLD, COPD, anxiety/depression, PSA, chronic [name] and C and cirrhosis w/portal HTN, transferred from [facility] for mgmt MRSA bacteremia/glenohumeral septic arthritis. Original presentation to [facility] was with AMS 2/2 metabolic encephalopathy/polysubstance abuse. Found to have AKI, labs from 2015 showed creatinine of 1.2-1.3, but more recent records in March, 2019 was 0.9. Was 3.3 on adm to [facility], 2.7 at time of transfer here, rising since 5/19 admission, we were asked to see on 5/23. No prior AKI, no NSAIDS, renal US non-obstr stones w/13.8, 12.9 cm echodense kidneys, UA neg for protein but large blood, 21-50 RBC's. Normal C3 C4, neg HIV   Assessment/Recommendations  AKI on CKD stage III (baseline 0.9-1.3)Suspect multifactorial in the setting of ongoing tenofovir use, possible volume depletion with poor oral intake with nausea/vomiting, infection. Improved UOP and stable SCr, looks like might be on verge of recovery, cont to monitor renal function daily. No HD needs at this time.   2. Left shoulder septic arthritis/MRSA bacteremia: S/P arthroscopic irrigation and debridement. Dapto until June 20.SNF planned for ATB administration 3. Hypertension:Controlled. 4. Anemia: Secondary to chronic illness as well as iron deficiency. Stable 1. Active infection at this time precludes intravenous iron 2. Aranesp 100 started 5/23 5. Chronic hepatitis B and C infections: Ongoing management with tenofovir, reports that he will undergo treatment for hepatitis C once he completes his tenofovir of course. Tenofovir currently held 6. Polysubstance abuse   Subjective: At least 2.4L UOP yesterday SCr up just a bit, overall stable, K and HCO3 ok Eating well, no N/V  Objective: Vital signs in last 24 hours: Temp:  [97.3 F (36.3 C)-98.4 F (36.9 C)] 98.1 F (36.7 C) (05/29 1300) Pulse Rate:  [87-94] 88 (05/29 1300) Resp:  [15-19] 15 (05/29 1300) BP:  (112-138)/(69-80) 115/71 (05/29 1300) SpO2:  [94 %-100 %] 98 % (05/29 1300) Weight:  [74.3 kg (163 lb 12.8 oz)] 74.3 kg (163 lb 12.8 oz) (05/29 0500) Weight change: 0.2 kg (7.1 oz)  Intake/Output from previous day: 05/28 0701 - 05/29 0700 In: 960 [P.O.:960] Out: 2375 [Urine:2375] Intake/Output this shift: Total I/O In: 592.5 [P.O.:480; IV Piggyback:112.5] Out: -   NAD, RRR w/o rub, nl s1s2 CTAB, nl wob Trace LEE Nonofocal, no astericus NCAT EOMI   Lab Results: Recent Labs    07/31/17 0528 08/01/17 0526  WBC 5.9 5.9  HGB 8.9* 8.3*  HCT 27.1* 25.3*  PLT 156 142*   BMET:  Recent Labs    07/31/17 0528 08/01/17 0526  NA 140 137  137  K 4.8 4.2  4.2  CL 105 100*  100*  CO2 24 26  25   GLUCOSE 89 93  92  BUN 52* 55*  55*  CREATININE 3.94* 4.06*  4.02*  CALCIUM 9.0 8.8*  8.7*   No results for input(s): PTH in the last 72 hours. Iron Studies: No results for input(s): IRON, TIBC, TRANSFERRIN, FERRITIN in the last 72 hours. Studies/Results: Ir Fluoro Guide Cv Line Right  Result Date: 08/01/2017 INDICATION: Poor venous access. Chronic renal insufficiency. Request made for placement of a jugular approach tunneled central venous catheter for durable intravenous access for prolonged antibiotic administration. EXAM: TUNNELED CENTRAL VENOUS CATHETER PLACEMENT WITH ULTRASOUND AND FLUOROSCOPIC GUIDANCE MEDICATIONS: Patient is currently admitted to the hospital and receiving intravenous antibiotics. The antibiotic was given in an appropriate time interval prior to skin puncture. ANESTHESIA/SEDATION: None FLUOROSCOPY TIME:  18 seconds (0.8 mGy) COMPLICATIONS: None immediate. PROCEDURE: Informed written consent was obtained from the  patient after a discussion of the risks, benefits, and alternatives to treatment. Questions regarding the procedure were encouraged and answered. The right neck and chest were prepped with chlorhexidine in a sterile fashion, and a sterile drape was  applied covering the operative field. Maximum barrier sterile technique with sterile gowns and gloves were used for the procedure. A timeout was performed prior to the initiation of the procedure. After the overlying soft tissues were anesthetized, a small venotomy incision was created and a micropuncture kit was utilized to access the internal jugular vein. Real-time ultrasound guidance was utilized for vascular access including the acquisition of a permanent ultrasound image documenting patency of the accessed vessel. The microwire was utilized to measure appropriate catheter length. The micropuncture sheath was exchanged for a peel-away sheath over a guidewire. A 5 French single lumen tunneled central venous catheter measuring 27 cm was tunneled in a retrograde fashion from the anterior chest wall to the venotomy incision. The catheter was then placed through the peel-away sheath with tip ultimately positioned at the superior caval-atrial junction. Final catheter positioning was confirmed and documented with a spot radiographic image. The catheter aspirates and flushes normally. The catheter was flushed with appropriate volume heparin dwells. The catheter exit site was secured with a 0-Prolene retention suture. The venotomy incision was closed with Dermabond and Steri-strips. Dressings were applied. The patient tolerated the procedure well without immediate post procedural complication. FINDINGS: After catheter placement, the tip lies within the superior cavoatrial junction. The catheter aspirates and flushes normally and is ready for immediate use. IMPRESSION: Successful placement of 27 cm single lumen tunneled central venous catheter via the right internal jugular vein with tip terminating at the superior caval atrial junction. The catheter is ready for immediate use. Electronically Signed   By: Sandi Mariscal M.D.   On: 08/01/2017 10:59   Ir US Guide Vasc Access Right  Result Date: 08/01/2017 INDICATION: Poor  venous access. Chronic renal insufficiency. Request made for placement of a jugular approach tunneled central venous catheter for durable intravenous access for prolonged antibiotic administration. EXAM: TUNNELED CENTRAL VENOUS CATHETER PLACEMENT WITH ULTRASOUND AND FLUOROSCOPIC GUIDANCE MEDICATIONS: Patient is currently admitted to the hospital and receiving intravenous antibiotics. The antibiotic was given in an appropriate time interval prior to skin puncture. ANESTHESIA/SEDATION: None FLUOROSCOPY TIME:  18 seconds (0.8 mGy) COMPLICATIONS: None immediate. PROCEDURE: Informed written consent was obtained from the patient after a discussion of the risks, benefits, and alternatives to treatment. Questions regarding the procedure were encouraged and answered. The right neck and chest were prepped with chlorhexidine in a sterile fashion, and a sterile drape was applied covering the operative field. Maximum barrier sterile technique with sterile gowns and gloves were used for the procedure. A timeout was performed prior to the initiation of the procedure. After the overlying soft tissues were anesthetized, a small venotomy incision was created and a micropuncture kit was utilized to access the internal jugular vein. Real-time ultrasound guidance was utilized for vascular access including the acquisition of a permanent ultrasound image documenting patency of the accessed vessel. The microwire was utilized to measure appropriate catheter length. The micropuncture sheath was exchanged for a peel-away sheath over a guidewire. A 5 French single lumen tunneled central venous catheter measuring 27 cm was tunneled in a retrograde fashion from the anterior chest wall to the venotomy incision. The catheter was then placed through the peel-away sheath with tip ultimately positioned at the superior caval-atrial junction. Final catheter positioning was confirmed and documented with  a spot radiographic image. The catheter aspirates  and flushes normally. The catheter was flushed with appropriate volume heparin dwells. The catheter exit site was secured with a 0-Prolene retention suture. The venotomy incision was closed with Dermabond and Steri-strips. Dressings were applied. The patient tolerated the procedure well without immediate post procedural complication. FINDINGS: After catheter placement, the tip lies within the superior cavoatrial junction. The catheter aspirates and flushes normally and is ready for immediate use. IMPRESSION: Successful placement of 27 cm single lumen tunneled central venous catheter via the right internal jugular vein with tip terminating at the superior caval atrial junction. The catheter is ready for immediate use. Electronically Signed   By: Sandi Mariscal M.D.   On: 08/01/2017 10:59    Scheduled: . ALPRAZolam  0.5 mg Oral Daily  . citalopram  10 mg Oral Daily  . darbepoetin (ARANESP) injection - NON-DIALYSIS  100 mcg Subcutaneous Q Thu-1800  . feeding supplement (ENSURE ENLIVE)  237 mL Oral BID BM  . lidocaine (PF)  5 mL Other Once  . lidocaine      . multivitamin with minerals  1 tablet Oral Daily  . nicotine  21 mg Transdermal Daily  . NIFEdipine  30 mg Oral Daily     LOS: 10 days   Kineta Fudala B 08/01/2017,3:07 PM

## 2017-08-01 NOTE — Progress Notes (Signed)
ORTHOPAEDIC PROGRESS NOTE  s/p Procedure(s): IRRIGATION AND DEBRIDEMENT SHOULDER  SUBJECTIVE: Improved pain compared to preop, moving shoulder actively some.  OBJECTIVE: PE: Left upper extremity: Distal motor and sensory intact, AFE to 80 without much pain  Vitals:   07/31/17 2145 08/01/17 0440  BP: 135/69 112/69  Pulse: 88 90  Resp: 19 16  Temp: 98 F (36.7 C) 98.4 F (36.9 C)  SpO2: 99% 94%     ASSESSMENT: Glenn Alvarado is a 52 y.o. male doing well postoperatively.  We discussed with the patient and his sister his long-term outlook previously.  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  He is not going to be a candidate for arthroscopy and cuff repair or for arthroplasty due to his infection.  His bone quality was poor.  PLAN: Weightbearing: WBAT LUE Insicional and dressing care: open, sutures out in 7 days Orthopedic device(s): none Showering: As tolerated VTE prophylaxis: Not indicated in ambulatory upper extremity patient from orthopedic perspective, defer to primary team Pain control: Per primary team but would minimize narcotics, adjuvant therapies as appropriate.  Patient should not require more than 2 to 3 days of narcotics after the type of surgery the head pain is primarily from his underlying issue. Follow - up plan: sutures can be removed at SNF if at snf in 1 week on June 5, f/u in clinic in 3 weeks for motion check. Contact information:  Weekdays 8-5 Ophelia Charter MD [phone], After hours and holidays please check [email or web address] for group call information for [facility]  From an orthopedic perspective patient the surgery the patient had was otherwise outpatient.

## 2017-08-01 NOTE — Progress Notes (Signed)
PROGRESS NOTE    Glenn Alvarado  WHQ:[id number] DOB: 1965/10/19 DOA: 07/22/2017 PCP: Harvie Junior, MD   Brief Narrative 52 year old male with history of coronary artery disease, hypertension, hyperlipidemia, COPD, depression, anxiety, substance abuse disorder was admitted to [facility] few days ago with acute encephalopathy and questionable suicidal ideation. In the ED at [facility] he was noted to have leukocytosis, renal failure, anion gap metabolic acidosis, elevated bilirubin and AST as well as worsening anemia from his baseline. Urine drug screen was positive for amphetamines and benzodiazepines. Following admission patient improved, his encephalopathy resolved. Due to concern for suicidal ideation psychiatry was consulted and the patient was cleared from psychiatry standpoint. He incidentally was noted to have 1/4 blood cultures positive for MRSA. He has also been complaining of worsening left shoulder pain and underwent the plain imaging as well as a CT scan which was concerning for osteomyelitis of the glenoid humeral joint. Orthopedics were consulted and recommended transfer to a tertiary center and he was moved to [facility] orthopedic surgery and ID were consulted. Underwent an MRI which was positive for septic arthritis and he was taken to the operating room on 5/21    Assessment & Plan:   Principal Problem:   MRSA bacteremia Active Problems:   CAD (coronary artery disease)   Depression with anxiety   Essential hypertension   GERD (gastroesophageal reflux disease)   HLD (hyperlipidemia)   Substance abuse ([place])   AKI (acute kidney injury) ([place])   Pancytopenia ([place])   Acute metabolic encephalopathy   Septic joint of left shoulder region ([place])   Left shoulder pain   Abnormal LFTs   Tobacco abuse   COPD (chronic obstructive pulmonary disease) ([name])   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 Repeat blood cultures negative. Currently on daptomycin for 4 more weeks till June 20. Echo showed no evidence of endocarditis. This patient was transferred here from [facility] morphine.tunneled catheter in place. 2]AKIon CKD 3Nephrology following. Renal ultrasound no obstruction. noted tenofevir stopped to see improvement in aki.  3]history of cirrhosis of the liver/hepatitis B followed at [facility].  4]substance abuse disorder-patient is very upset that the Xanax has been decreased. Continue Celexa.      DVT prophylaxis:scd Code Status:full Family Communication: none Disposition Plan: tbd Consultants: ortho,id,renal  ProceduresLeft shoulder irrigation and debridement 07/25/2017   Antimicrobials daptomycin  Subjective:no new events  Objective: Vitals:   07/31/17 2145 08/01/17 0440 08/01/17 0500 08/01/17 1128  BP: 135/69 112/69  127/79  Pulse: 88 90  94  Resp: 19 16  18   Temp: 98 F (36.7 C) 98.4 F (36.9 C)    TempSrc: Oral Oral    SpO2: 99% 94%  99%  Weight:   74.3 kg (163 lb 12.8 oz)   Height:        Intake/Output Summary (Last 24 hours) at 08/01/2017 1200 Last data filed at 08/01/2017 0900 Gross per 24 hour  Intake 960 ml  Output 2375 ml  Net -1415 ml   Filed Weights   07/30/17 0446 07/31/17 0522 08/01/17 0500  Weight: 75.4 kg (166 lb 3.6 oz) 74.1 kg (163 lb 5.8 oz) 74.3 kg (163 lb 12.8 oz)    Examination:  General exam: Appears calm and comfortable  Respiratory system: Clear to auscultation. Respiratory effort normal. Cardiovascular system: S1 & S2 heard, RRR. No JVD, murmurs, rubs, gallops or clicks.  No pedal edema. Gastrointestinal system: Abdomen is nondistended, soft and nontender. No organomegaly or masses felt. Normal bowel sounds heard. Central nervous system: Alert and oriented. No focal neurological deficits. Extremities:  Symmetric 5 x 5 power. Skin: No rashes, lesions or ulcers Psychiatry: Judgement and insight appear normal. Mood & affect appropriate.     Data Reviewed: I have personally reviewed following labs and imaging studies  CBC: Recent Labs  Lab 07/26/17 0506 07/27/17 0520 07/28/17 0515 07/29/17 0344 07/30/17 0439 07/31/17 0528 08/01/17 0526  WBC 5.6 6.3 5.6 5.9 4.7 5.9 5.9  NEUTROABS 3.0 3.6 2.9 3.0 2.4  --   --   HGB 7.3* 7.6* 8.2* 8.1* 8.0* 8.9* 8.3*  HCT 22.8* 23.1* 24.9* 24.9* 24.5* 27.1* 25.3*  MCV 100.0 97.9 97.3 97.3 98.8 97.1 95.8  PLT 84* 108* 116* 132* 120* 156 361*   Basic Metabolic Panel: Recent Labs  Lab 07/28/17 0515 07/29/17 0344 07/30/17 0439 07/31/17 0528 08/01/17 0526  NA 140 139  138 140 140 137  137  K 5.0 4.9  4.9 4.8 4.8 4.2  4.2  CL 110 109  108 108 105 100*  100*  CO2 23 23  22 24 24 26  25   GLUCOSE 90 90  86 92 89 93  92  BUN 46* 45*  45* 49* 52* 55*  55*  CREATININE 3.46* 3.59*  3.63* 3.73* 3.94* 4.06*  4.02*  CALCIUM 8.6* 8.5*  8.4* 8.6* 9.0 8.8*  8.7*  PHOS 4.4 4.6 5.0* 5.8* 5.4*   GFR: Estimated Creatinine Clearance: 21.3 mL/min (A) (by C-G formula based on SCr of 4.02 mg/dL (H)). Liver Function Tests: Recent Labs  Lab 07/28/17 0515 07/29/17 0344 07/30/17 0439 07/31/17 0528 08/01/17 0526  AST  --   --   --   --  73*  ALT  --   --   --   --  30  ALKPHOS  --   --   --   --  85  BILITOT  --   --   --   --  0.6  PROT  --   --   --   --  8.7*  ALBUMIN 2.3* 2.4* 2.3* 2.7* 2.6*  2.6*   No results for input(s): LIPASE, AMYLASE in the last 168 hours. No results for input(s): AMMONIA in the last 168 hours. Coagulation Profile: No results for input(s): INR, PROTIME in the last 168 hours. Cardiac Enzymes: Recent Labs  Lab 08/01/17 0526  CKTOTAL 64   BNP (last 3 results) No results for input(s): PROBNP in the last 8760 hours. HbA1C: No results for input(s): HGBA1C in the last 72 hours. CBG: Recent Labs  Lab  07/28/17 1257 07/29/17 0839 07/30/17 0751 07/31/17 0754 08/01/17 1003  GLUCAP 119* 92 87 88 108*   Lipid Profile: No results for input(s): CHOL, HDL, LDLCALC, TRIG, CHOLHDL, LDLDIRECT in the last 72 hours. Thyroid Function Tests: No results for input(s): TSH, T4TOTAL, FREET4, T3FREE, THYROIDAB in the last 72 hours. Anemia Panel: No results for input(s): VITAMINB12, FOLATE, FERRITIN, TIBC, IRON, RETICCTPCT in the last 72 hours. Sepsis Labs: No results for input(s): PROCALCITON, LATICACIDVEN in the last 168 hours.  Recent Results (from the past 240 hour(s))  Culture, blood (Routine X 2) w Reflex to ID Panel     Status: None   Collection Time: 07/23/17  3:59 AM  Result Value Ref Range Status   Specimen Description BLOOD RIGHT HAND  Final   Special Requests   Final  BOTTLES DRAWN AEROBIC AND ANAEROBIC Blood Culture results may not be optimal due to an inadequate volume of blood received in culture bottles   Culture   Final    NO GROWTH 5 DAYS Performed at [facility], [place] [address] [place], [place] [address]    Report Status 07/28/2017 FINAL  Final  Culture, blood (Routine X 2) w Reflex to ID Panel     Status: None   Collection Time: 07/23/17  4:19 AM  Result Value Ref Range Status   Specimen Description BLOOD LEFT HAND  Final   Special Requests   Final    BOTTLES DRAWN AEROBIC AND ANAEROBIC Blood Culture results may not be optimal due to an inadequate volume of blood received in culture bottles   Culture   Final    NO GROWTH 5 DAYS Performed at [facility], [place] [address], [place], [place] [address]    Report Status 07/28/2017 FINAL  Final  Body fluid culture     Status: None   Collection Time: 07/23/17  6:10 PM  Result Value Ref Range Status   Specimen Description FLUID SYNOVIAL SHOULDER  Final   Special Requests NONE  Final   Gram Stain   Final    FEW WBC PRESENT,BOTH PMN AND MONONUCLEAR NO ORGANISMS SEEN    Culture   Final    NO GROWTH 3  DAYS Performed at [facility], [place] [address] [place], [place] [address]    Report Status 07/27/2017 FINAL  Final  Surgical pcr screen     Status: Abnormal   Collection Time: 07/24/17 11:47 AM  Result Value Ref Range Status   MRSA, PCR NEGATIVE NEGATIVE Final   Staphylococcus aureus POSITIVE (A) NEGATIVE Final    Comment: (NOTE) The Xpert SA Assay (FDA approved for NASAL specimens in patients 57 years of age and older), is one component of a comprehensive surveillance program. It is not intended to diagnose infection nor to guide or monitor treatment. Performed at [facility], [place] [address], [place], [place] [address]   Aerobic/Anaerobic Culture (surgical/deep wound)     Status: None   Collection Time: 07/25/17 11:03 AM  Result Value Ref Range Status   Specimen Description TISSUE LEFT SHOULDER SPEC 1  Final   Special Requests PATIENT ON FOLLOWING ANCEF  Final   Gram Stain   Final    FEW WBC PRESENT, PREDOMINANTLY PMN NO ORGANISMS SEEN    Culture   Final    No growth aerobically or anaerobically. Performed at [facility], [place] [address], [place], [place] [address]    Report Status 07/30/2017 FINAL  Final  Aerobic/Anaerobic Culture (surgical/deep wound)     Status: None   Collection Time: 07/25/17 11:05 AM  Result Value Ref Range Status   Specimen Description TISSUE LEFT SHOULDER SPEC 2  Final   Special Requests PATIENT ON FOLLOWING ANCEF  Final   Gram Stain   Final    FEW WBC PRESENT, PREDOMINANTLY PMN NO ORGANISMS SEEN    Culture   Final    No growth aerobically or anaerobically. Performed at [facility], [place] [address], [place], [place] [address]    Report Status 07/30/2017 FINAL  Final         Radiology Studies: Ir Fluoro Guide Cv Line Right  Result Date: 08/01/2017 INDICATION: Poor venous access. Chronic renal insufficiency. Request made for placement of a jugular approach tunneled central venous catheter for durable intravenous  access for prolonged antibiotic administration. EXAM: TUNNELED CENTRAL VENOUS CATHETER PLACEMENT WITH ULTRASOUND AND FLUOROSCOPIC GUIDANCE MEDICATIONS: Patient is currently admitted to the hospital and receiving intravenous antibiotics. The antibiotic was given in an appropriate time interval prior to skin puncture. ANESTHESIA/SEDATION: None FLUOROSCOPY TIME:  18 seconds (0.8 mGy) COMPLICATIONS: None immediate. PROCEDURE: Informed written consent was obtained from the patient after a discussion of the risks, benefits, and alternatives to treatment. Questions regarding the procedure were encouraged and answered. The right neck and chest were prepped with chlorhexidine in a sterile fashion, and a sterile drape was applied covering the operative field. Maximum barrier sterile technique with sterile gowns and gloves were used for the procedure. A timeout was performed prior to the initiation of the procedure. After the overlying soft tissues were anesthetized, a small venotomy incision was created and a micropuncture kit was utilized to access the internal jugular vein. Real-time ultrasound guidance was utilized for vascular access including the acquisition of a permanent ultrasound image documenting patency of the accessed vessel. The microwire was utilized to measure appropriate catheter length. The micropuncture sheath was exchanged for a peel-away sheath over a guidewire. A 5 French single lumen tunneled central venous catheter measuring 27 cm was tunneled in a retrograde fashion from the anterior chest wall to the venotomy incision. The catheter was then placed through the peel-away sheath with tip ultimately positioned at the superior caval-atrial junction. Final catheter positioning was confirmed and documented with a spot radiographic image. The catheter aspirates and flushes normally. The catheter was flushed with appropriate volume heparin dwells. The catheter exit site was secured with a 0-Prolene retention  suture. The venotomy incision was closed with Dermabond and Steri-strips. Dressings were applied. The patient tolerated the procedure well without immediate post procedural complication. FINDINGS: After catheter placement, the tip lies within the superior cavoatrial junction. The catheter aspirates and flushes normally and is ready for immediate use. IMPRESSION: Successful placement of 27 cm single lumen tunneled central venous catheter via the right internal jugular vein with tip terminating at the superior caval atrial junction. The catheter is ready for immediate use. Electronically Signed   By: Sandi Mariscal M.D.   On: 08/01/2017 10:59   Ir US Guide Vasc Access Right  Result Date: 08/01/2017 INDICATION: Poor venous access. Chronic renal insufficiency. Request made for placement of a jugular approach tunneled central venous catheter for durable intravenous access for prolonged antibiotic administration. EXAM: TUNNELED CENTRAL VENOUS CATHETER PLACEMENT WITH ULTRASOUND AND FLUOROSCOPIC GUIDANCE MEDICATIONS: Patient is currently admitted to the hospital and receiving intravenous antibiotics. The antibiotic was given in an appropriate time interval prior to skin puncture. ANESTHESIA/SEDATION: None FLUOROSCOPY TIME:  18 seconds (0.8 mGy) COMPLICATIONS: None immediate. PROCEDURE: Informed written consent was obtained from the patient after a discussion of the risks, benefits, and alternatives to treatment. Questions regarding the procedure were encouraged and answered. The right neck and chest were prepped with chlorhexidine in a sterile fashion, and a sterile drape was applied covering the operative field. Maximum barrier sterile technique with sterile gowns and gloves were used for the procedure. A timeout was performed prior to the initiation of the procedure. After the overlying soft tissues were anesthetized, a small venotomy incision was created and a micropuncture kit was utilized to access the internal  jugular vein. Real-time ultrasound guidance was utilized for vascular access including the acquisition of a permanent ultrasound image documenting patency of the accessed vessel. The microwire was utilized to measure appropriate catheter length. The micropuncture sheath was  exchanged for a peel-away sheath over a guidewire. A 5 French single lumen tunneled central venous catheter measuring 27 cm was tunneled in a retrograde fashion from the anterior chest wall to the venotomy incision. The catheter was then placed through the peel-away sheath with tip ultimately positioned at the superior caval-atrial junction. Final catheter positioning was confirmed and documented with a spot radiographic image. The catheter aspirates and flushes normally. The catheter was flushed with appropriate volume heparin dwells. The catheter exit site was secured with a 0-Prolene retention suture. The venotomy incision was closed with Dermabond and Steri-strips. Dressings were applied. The patient tolerated the procedure well without immediate post procedural complication. FINDINGS: After catheter placement, the tip lies within the superior cavoatrial junction. The catheter aspirates and flushes normally and is ready for immediate use. IMPRESSION: Successful placement of 27 cm single lumen tunneled central venous catheter via the right internal jugular vein with tip terminating at the superior caval atrial junction. The catheter is ready for immediate use. Electronically Signed   By: Sandi Mariscal M.D.   On: 08/01/2017 10:59        Scheduled Meds: . ALPRAZolam  0.5 mg Oral Daily  . citalopram  10 mg Oral Daily  . darbepoetin (ARANESP) injection - NON-DIALYSIS  100 mcg Subcutaneous Q Thu-1800  . feeding supplement (ENSURE ENLIVE)  237 mL Oral BID BM  . lidocaine (PF)  5 mL Other Once  . lidocaine      . nicotine  21 mg Transdermal Daily  . NIFEdipine  30 mg Oral Daily   Continuous Infusions: . sodium chloride 10 mL/hr at  07/24/17 1248  . sodium chloride 10 mL/hr at 07/25/17 1830  . DAPTOmycin (CUBICIN)  IV 620 mg (08/01/17 1134)     LOS: 10 days     Georgette Shell, MD [facility] If 7PM-7AM, please contact night-coverage [email or web address] Password [email or web address] 08/01/2017, 12:00 PM

## 2017-08-01 NOTE — Progress Notes (Signed)
Nutrition Follow-up  DOCUMENTATION CODES:   Not applicable  INTERVENTION:   -Continue Ensure Enlive po BID, each supplement provides 350 kcal and 20 grams of protein -MVI daily  NUTRITION DIAGNOSIS:   Increased nutrient needs related to chronic illness(COPD) as evidenced by estimated needs.  Ongoing  GOAL:   Patient will meet greater than or equal to 90% of their needs  Progressing  MONITOR:   Supplement acceptance, Diet advancement, Labs, Weight trends  REASON FOR ASSESSMENT:   Malnutrition Screening Tool    ASSESSMENT:   52 year old male with PMH significant for hypertension, hyperlipidemia, COPD, GERD, depression, anxiety, CAD, substance abuse, and tobacco abuse. Pt presented to [facility] on 07/19/17 with AMS likely due to drug abuse and was transferred to [facility] for evaluation and treatment of left shoulder pain.  5/22- s/p Procedure: Arthroscopic extensive debridement and arthroscopic synovial biopsy 5/29- s/p rt IJ placement  Spoke with pt at bedside, who was drowsy at time of visit, but amenable to answer simple questions. He reports his appetite is "okay, but not eating much". Noted meal completion 75-100%, however, pt estimates he is eating 50% of meals. He confirms he is consuming Ensure supplements. Encouraged intake of meals and supplements to promote healing.  Case discussed with RN, who confirms pt is consuming Ensure supplement. RN just provided Ensure supplement after RD visit.   Per CSW, attempting to obtain SNF placement for completion of IV antibiotics.   Labs reviewed.   Diet Order:   Diet Order           Diet regular Room service appropriate? Yes; Fluid consistency: Thin  Diet effective now          EDUCATION NEEDS:   No education needs have been identified at this time  Skin:  Skin Assessment: Skin Integrity Issues: Skin Integrity Issues:: Incisions Incisions: L shoulder  Last BM:  07/31/17  Height:   Ht Readings from Last 1  Encounters:  07/25/17 5' 7.99" (1.727 m)    Weight:   Wt Readings from Last 1 Encounters:  08/01/17 163 lb 12.8 oz (74.3 kg)    Ideal Body Weight:  70 kg  BMI:  Body mass index is 24.91 kg/m.  Estimated Nutritional Needs:   Kcal:  2100-2300 kcal/day  Protein:  95-110 grams/day  Fluid:  2.1-2.3 L/day    Ole Lafon A. Jimmye Norman, RD, LDN, CDE Pager: [phone] After hours Pager: [phone]

## 2017-08-02 LAB — CBC
HCT: 24 % — ABNORMAL LOW (ref 39.0–52.0)
Hemoglobin: 7.9 g/dL — ABNORMAL LOW (ref 13.0–17.0)
MCH: 31.7 pg (ref 26.0–34.0)
MCHC: 32.9 g/dL (ref 30.0–36.0)
MCV: 96.4 fL (ref 78.0–100.0)
PLATELETS: 143 10*3/uL — AB (ref 150–400)
RBC: 2.49 MIL/uL — ABNORMAL LOW (ref 4.22–5.81)
RDW: 14.7 % (ref 11.5–15.5)
WBC: 6.1 10*3/uL (ref 4.0–10.5)

## 2017-08-02 LAB — COMPREHENSIVE METABOLIC PANEL
ALT: 29 U/L (ref 17–63)
ANION GAP: 11 (ref 5–15)
AST: 68 U/L — ABNORMAL HIGH (ref 15–41)
Albumin: 2.5 g/dL — ABNORMAL LOW (ref 3.5–5.0)
Alkaline Phosphatase: 87 U/L (ref 38–126)
BUN: 56 mg/dL — ABNORMAL HIGH (ref 6–20)
CHLORIDE: 101 mmol/L (ref 101–111)
CO2: 24 mmol/L (ref 22–32)
Calcium: 8.7 mg/dL — ABNORMAL LOW (ref 8.9–10.3)
Creatinine, Ser: 4.08 mg/dL — ABNORMAL HIGH (ref 0.61–1.24)
GFR, EST AFRICAN AMERICAN: 18 mL/min — AB (ref >60)
GFR, EST NON AFRICAN AMERICAN: 16 mL/min — AB (ref >60)
Glucose, Bld: 99 mg/dL (ref 65–99)
POTASSIUM: 4.4 mmol/L (ref 3.5–5.1)
SODIUM: 136 mmol/L (ref 135–145)
Total Bilirubin: 0.6 mg/dL (ref 0.3–1.2)
Total Protein: 7.9 g/dL (ref 6.5–8.1)

## 2017-08-02 LAB — RENAL FUNCTION PANEL
Albumin: 2.4 g/dL — ABNORMAL LOW (ref 3.5–5.0)
Anion gap: 10 (ref 5–15)
BUN: 57 mg/dL — AB (ref 6–20)
CALCIUM: 8.7 mg/dL — AB (ref 8.9–10.3)
CO2: 25 mmol/L (ref 22–32)
CREATININE: 4.05 mg/dL — AB (ref 0.61–1.24)
Chloride: 102 mmol/L (ref 101–111)
GFR, EST AFRICAN AMERICAN: 18 mL/min — AB (ref >60)
GFR, EST NON AFRICAN AMERICAN: 16 mL/min — AB (ref >60)
Glucose, Bld: 97 mg/dL (ref 65–99)
Phosphorus: 5.2 mg/dL — ABNORMAL HIGH (ref 2.5–4.6)
Potassium: 4.4 mmol/L (ref 3.5–5.1)
SODIUM: 137 mmol/L (ref 135–145)

## 2017-08-02 LAB — GLUCOSE, CAPILLARY: GLUCOSE-CAPILLARY: 97 mg/dL (ref 65–99)

## 2017-08-02 MED ORDER — TENOFOVIR ALAFENAMIDE FUMARATE 25 MG PO TABS: 1.0000 | ORAL_TABLET | Freq: Every day | ORAL | 11 refills | Status: AC

## 2017-08-02 NOTE — Progress Notes (Signed)
PROGRESS NOTE    Glenn Alvarado  QTM:[id number] DOB: August 31, 1965 DOA: 07/22/2017 PCP: Harvie Junior, MD  Brief Narrative:  52 year old male with history of coronary artery disease, hypertension, hyperlipidemia, COPD, depression, anxiety, substance abuse disorder was admitted to [facility] few days ago with acute encephalopathy and questionable suicidal ideation. In the ED at [facility] he was noted to have leukocytosis, renal failure, anion gap metabolic acidosis, elevated bilirubin and AST as well as worsening anemia from his baseline. Urine drug screen was positive for amphetamines and benzodiazepines. Following admission patient improved, his encephalopathy resolved. Due to concern for suicidal ideation psychiatry was consulted and the patient was cleared from psychiatry standpoint. He incidentally was noted to have 1/4 blood cultures positive for MRSA. He has also been complaining of worsening left shoulder pain and underwent the plain imaging as well as a CT scan which was concerning for osteomyelitis of the glenoid humeral joint. Orthopedics were consulted and recommended transfer to a tertiary center and he was moved to [facility] orthopedic surgery and ID were consulted. Underwent an MRI which was positive for septic arthritis and he was taken to the operating room on 5/21   Assessment & Plan:   Principal Problem:   MRSA bacteremia Active Problems:   CAD (coronary artery disease)   Depression with anxiety   Essential hypertension   GERD (gastroesophageal reflux disease)   HLD (hyperlipidemia)   Substance abuse ([place])   AKI (acute kidney injury) ([place])   Pancytopenia ([place])   Acute metabolic encephalopathy   Septic joint of left shoulder region ([place])   Left shoulder pain   Abnormal LFTs   Tobacco abuse   COPD (chronic obstructive pulmonary disease) ([name])   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 Repeat blood cultures negative. Currently on daptomycin for 4 more weeks till June 20. Echo showed no evidence of endocarditis. This patient was transferred here from [facility] morphine.tunneled catheter in place. 2]AKIon CKD 3Nephrology following. Renal ultrasound no obstruction. noted tenofevir stopped to see improvement in aki.  3]history of cirrhosis of the liver/hepatitis B followed at [facility].  4]substance abuse disorder-patient is very upset that the Xanax has been decreased. Continue Celexa.       DVT prophylaxis: SCD Code Status: Full code  None family Communication Disposition Plan: Plan is to discharge him to a skilled nursing facility and at room once he is renally and medically stable to be discharged. Consultants:  Nephrology, Ortho, infectious disease Procedures: Antimicrobials: Doxycycline Subjective: No new complaints still has lower extremity swelling and complains of left shoulder pain.   Objective: Vitals:   08/01/17 1608 08/01/17 2100 08/02/17 0412 08/02/17 0516  BP: 107/70 107/65  117/78  Pulse: (!) 110 98  85  Resp: 18 16  16   Temp: 97.7 F (36.5 C) 98.8 F (37.1 C)  98.1 F (36.7 C)  TempSrc: Oral Oral  Oral  SpO2: 100% 93%  99%  Weight:   75.3 kg (166 lb 0.1 oz)   Height:        Intake/Output Summary (Last 24 hours) at 08/02/2017 1128 Last data filed at 08/02/2017 0517 Gross per 24 hour  Intake 952.5 ml  Output 1050 ml  Net -97.5 ml   Filed Weights   07/31/17 0522 08/01/17 0500 08/02/17 0412  Weight: 74.1 kg (163 lb 5.8 oz) 74.3 kg (163 lb 12.8 oz) 75.3 kg (  166 lb 0.1 oz)    Examination:  General exam: Appears calm and comfortable  Respiratory system: Clear to auscultation. Respiratory effort normal. Cardiovascular system: S1 & S2 heard, RRR. No JVD, murmurs, rubs, gallops or clicks. No pedal  edema. Gastrointestinal system: Abdomen is nondistended, soft and nontender. No organomegaly or masses felt. Normal bowel sounds heard. Central nervous system: Alert and oriented. No focal neurological deficits. Extremities:EDEMA B/L LE 2 PLUS Skin: No rashes, lesions or ulcers Psychiatry: Judgement and insight appear normal. Mood & affect appropriate.     Data Reviewed: I have personally reviewed following labs and imaging studies  CBC: Recent Labs  Lab 07/27/17 0520 07/28/17 0515 07/29/17 0344 07/30/17 0439 07/31/17 0528 08/01/17 0526 08/02/17 0419  WBC 6.3 5.6 5.9 4.7 5.9 5.9 6.1  NEUTROABS 3.6 2.9 3.0 2.4  --   --   --   HGB 7.6* 8.2* 8.1* 8.0* 8.9* 8.3* 7.9*  HCT 23.1* 24.9* 24.9* 24.5* 27.1* 25.3* 24.0*  MCV 97.9 97.3 97.3 98.8 97.1 95.8 96.4  PLT 108* 116* 132* 120* 156 142* 161*   Basic Metabolic Panel: Recent Labs  Lab 07/29/17 0344 07/30/17 0439 07/31/17 0528 08/01/17 0526 08/02/17 0419  NA 139  138 140 140 137  137 136  137  K 4.9  4.9 4.8 4.8 4.2  4.2 4.4  4.4  CL 109  108 108 105 100*  100* 101  102  CO2 23  22 24 24 26  25 24  25   GLUCOSE 90  86 92 89 93  92 99  97  BUN 45*  45* 49* 52* 55*  55* 56*  57*  CREATININE 3.59*  3.63* 3.73* 3.94* 4.06*  4.02* 4.08*  4.05*  CALCIUM 8.5*  8.4* 8.6* 9.0 8.8*  8.7* 8.7*  8.7*  PHOS 4.6 5.0* 5.8* 5.4* 5.2*   GFR: Estimated Creatinine Clearance: 21.1 mL/min (A) (by C-G formula based on SCr of 4.05 mg/dL (H)). Liver Function Tests: Recent Labs  Lab 07/29/17 0344 07/30/17 0439 07/31/17 0528 08/01/17 0526 08/02/17 0419  AST  --   --   --  73* 68*  ALT  --   --   --  30 29  ALKPHOS  --   --   --  85 87  BILITOT  --   --   --  0.6 0.6  PROT  --   --   --  8.7* 7.9  ALBUMIN 2.4* 2.3* 2.7* 2.6*  2.6* 2.5*  2.4*   No results for input(s): LIPASE, AMYLASE in the last 168 hours. No results for input(s): AMMONIA in the last 168 hours. Coagulation Profile: No results for input(s): INR,  PROTIME in the last 168 hours. Cardiac Enzymes: Recent Labs  Lab 08/01/17 0526  CKTOTAL 64   BNP (last 3 results) No results for input(s): PROBNP in the last 8760 hours. HbA1C: No results for input(s): HGBA1C in the last 72 hours. CBG: Recent Labs  Lab 07/29/17 0839 07/30/17 0751 07/31/17 0754 08/01/17 1003 08/02/17 0810  GLUCAP 92 87 88 108* 97   Lipid Profile: No results for input(s): CHOL, HDL, LDLCALC, TRIG, CHOLHDL, LDLDIRECT in the last 72 hours. Thyroid Function Tests: No results for input(s): TSH, T4TOTAL, FREET4, T3FREE, THYROIDAB in the last 72 hours. Anemia Panel: No results for input(s): VITAMINB12, FOLATE, FERRITIN, TIBC, IRON, RETICCTPCT in the last 72 hours. Sepsis Labs: No results for input(s): PROCALCITON, LATICACIDVEN in the last 168 hours.  Recent Results (from the past 240 hour(s))  Body  fluid culture     Status: None   Collection Time: 07/23/17  6:10 PM  Result Value Ref Range Status   Specimen Description FLUID SYNOVIAL SHOULDER  Final   Special Requests NONE  Final   Gram Stain   Final    FEW WBC PRESENT,BOTH PMN AND MONONUCLEAR NO ORGANISMS SEEN    Culture   Final    NO GROWTH 3 DAYS Performed at [facility], [place] [address], [place], [place] [address]    Report Status 07/27/2017 FINAL  Final  Surgical pcr screen     Status: Abnormal   Collection Time: 07/24/17 11:47 AM  Result Value Ref Range Status   MRSA, PCR NEGATIVE NEGATIVE Final   Staphylococcus aureus POSITIVE (A) NEGATIVE Final    Comment: (NOTE) The Xpert SA Assay (FDA approved for NASAL specimens in patients 22 years of age and older), is one component of a comprehensive surveillance program. It is not intended to diagnose infection nor to guide or monitor treatment. Performed at [facility], [place] [address] [place], [place] [address]   Aerobic/Anaerobic Culture (surgical/deep wound)     Status: None   Collection Time: 07/25/17 11:03 AM  Result Value Ref Range  Status   Specimen Description TISSUE LEFT SHOULDER SPEC 1  Final   Special Requests PATIENT ON FOLLOWING ANCEF  Final   Gram Stain   Final    FEW WBC PRESENT, PREDOMINANTLY PMN NO ORGANISMS SEEN    Culture   Final    No growth aerobically or anaerobically. Performed at [facility], [place] [address] [place], [place] [address]    Report Status 07/30/2017 FINAL  Final  Aerobic/Anaerobic Culture (surgical/deep wound)     Status: None   Collection Time: 07/25/17 11:05 AM  Result Value Ref Range Status   Specimen Description TISSUE LEFT SHOULDER SPEC 2  Final   Special Requests PATIENT ON FOLLOWING ANCEF  Final   Gram Stain   Final    FEW WBC PRESENT, PREDOMINANTLY PMN NO ORGANISMS SEEN    Culture   Final    No growth aerobically or anaerobically. Performed at [facility], [place] [address], [place], [place] [address]    Report Status 07/30/2017 FINAL  Final         Radiology Studies: Ir Fluoro Guide Cv Line Right  Result Date: 08/01/2017 INDICATION: Poor venous access. Chronic renal insufficiency. Request made for placement of a jugular approach tunneled central venous catheter for durable intravenous access for prolonged antibiotic administration. EXAM: TUNNELED CENTRAL VENOUS CATHETER PLACEMENT WITH ULTRASOUND AND FLUOROSCOPIC GUIDANCE MEDICATIONS: Patient is currently admitted to the hospital and receiving intravenous antibiotics. The antibiotic was given in an appropriate time interval prior to skin puncture. ANESTHESIA/SEDATION: None FLUOROSCOPY TIME:  18 seconds (0.8 mGy) COMPLICATIONS: None immediate. PROCEDURE: Informed written consent was obtained from the patient after a discussion of the risks, benefits, and alternatives to treatment. Questions regarding the procedure were encouraged and answered. The right neck and chest were prepped with chlorhexidine in a sterile fashion, and a sterile drape was applied covering the operative field. Maximum barrier sterile  technique with sterile gowns and gloves were used for the procedure. A timeout was performed prior to the initiation of the procedure. After the overlying soft tissues were anesthetized, a small venotomy incision was created and a micropuncture kit was utilized to access the internal jugular vein. Real-time ultrasound guidance was utilized for vascular access including the acquisition of a permanent  ultrasound image documenting patency of the accessed vessel. The microwire was utilized to measure appropriate catheter length. The micropuncture sheath was exchanged for a peel-away sheath over a guidewire. A 5 French single lumen tunneled central venous catheter measuring 27 cm was tunneled in a retrograde fashion from the anterior chest wall to the venotomy incision. The catheter was then placed through the peel-away sheath with tip ultimately positioned at the superior caval-atrial junction. Final catheter positioning was confirmed and documented with a spot radiographic image. The catheter aspirates and flushes normally. The catheter was flushed with appropriate volume heparin dwells. The catheter exit site was secured with a 0-Prolene retention suture. The venotomy incision was closed with Dermabond and Steri-strips. Dressings were applied. The patient tolerated the procedure well without immediate post procedural complication. FINDINGS: After catheter placement, the tip lies within the superior cavoatrial junction. The catheter aspirates and flushes normally and is ready for immediate use. IMPRESSION: Successful placement of 27 cm single lumen tunneled central venous catheter via the right internal jugular vein with tip terminating at the superior caval atrial junction. The catheter is ready for immediate use. Electronically Signed   By: Sandi Mariscal M.D.   On: 08/01/2017 10:59   Ir US Guide Vasc Access Right  Result Date: 08/01/2017 INDICATION: Poor venous access. Chronic renal insufficiency. Request made for  placement of a jugular approach tunneled central venous catheter for durable intravenous access for prolonged antibiotic administration. EXAM: TUNNELED CENTRAL VENOUS CATHETER PLACEMENT WITH ULTRASOUND AND FLUOROSCOPIC GUIDANCE MEDICATIONS: Patient is currently admitted to the hospital and receiving intravenous antibiotics. The antibiotic was given in an appropriate time interval prior to skin puncture. ANESTHESIA/SEDATION: None FLUOROSCOPY TIME:  18 seconds (0.8 mGy) COMPLICATIONS: None immediate. PROCEDURE: Informed written consent was obtained from the patient after a discussion of the risks, benefits, and alternatives to treatment. Questions regarding the procedure were encouraged and answered. The right neck and chest were prepped with chlorhexidine in a sterile fashion, and a sterile drape was applied covering the operative field. Maximum barrier sterile technique with sterile gowns and gloves were used for the procedure. A timeout was performed prior to the initiation of the procedure. After the overlying soft tissues were anesthetized, a small venotomy incision was created and a micropuncture kit was utilized to access the internal jugular vein. Real-time ultrasound guidance was utilized for vascular access including the acquisition of a permanent ultrasound image documenting patency of the accessed vessel. The microwire was utilized to measure appropriate catheter length. The micropuncture sheath was exchanged for a peel-away sheath over a guidewire. A 5 French single lumen tunneled central venous catheter measuring 27 cm was tunneled in a retrograde fashion from the anterior chest wall to the venotomy incision. The catheter was then placed through the peel-away sheath with tip ultimately positioned at the superior caval-atrial junction. Final catheter positioning was confirmed and documented with a spot radiographic image. The catheter aspirates and flushes normally. The catheter was flushed with  appropriate volume heparin dwells. The catheter exit site was secured with a 0-Prolene retention suture. The venotomy incision was closed with Dermabond and Steri-strips. Dressings were applied. The patient tolerated the procedure well without immediate post procedural complication. FINDINGS: After catheter placement, the tip lies within the superior cavoatrial junction. The catheter aspirates and flushes normally and is ready for immediate use. IMPRESSION: Successful placement of 27 cm single lumen tunneled central venous catheter via the right internal jugular vein with tip terminating at the superior caval atrial junction. The catheter is  ready for immediate use. Electronically Signed   By: Sandi Mariscal M.D.   On: 08/01/2017 10:59        Scheduled Meds: . ALPRAZolam  0.5 mg Oral Daily  . citalopram  10 mg Oral Daily  . darbepoetin (ARANESP) injection - NON-DIALYSIS  100 mcg Subcutaneous Q Thu-1800  . feeding supplement (ENSURE ENLIVE)  237 mL Oral BID BM  . lidocaine (PF)  5 mL Other Once  . multivitamin with minerals  1 tablet Oral Daily  . nicotine  21 mg Transdermal Daily  . NIFEdipine  30 mg Oral Daily   Continuous Infusions: . sodium chloride 10 mL/hr at 07/24/17 1248  . sodium chloride 10 mL/hr at 07/25/17 1830  . DAPTOmycin (CUBICIN)  IV Stopped (08/01/17 1210)     LOS: 11 days     Georgette Shell, MD   If 7PM-7AM, please contact night-coverage [email or web address] Password [email or web address] 08/02/2017, 11:28 AM

## 2017-08-02 NOTE — Progress Notes (Signed)
ID PROGRESS NOTE  Plan is unchanged for hte treatment of complicated MRSA bacteremia and probably left shoulder septic arthritis  In terms of chronic [name], due to him developing worsening AKI, he is no longer candidate for entecavir. We are applying for him to get TAF (vemilidy) approved by [facility] for him to start in the next few days.  In terms of chronic hep C - treatment deferred until can finish treatment for MRSA bacteremia.

## 2017-08-02 NOTE — Progress Notes (Signed)
Patient ID: Glenn Alvarado, male   DOB: January 28, 1966, 52 y.o.   MRN: [id number] [facility] Progress Note   Assessment/ Plan:   1.  Acute kidney injury on chronic kidney disease stage III: This is suspected to be primarily hemodynamically mediated ATN with decreased oral intake/volume depletion with ongoing tenofovir/bacteremia.  He is nonoliguric and appears to be approaching the plateau phase of ATN given the unchanged creatinine overnight with relative fixed urine output.  Anticipate renal recovery soon.  No acute electrolyte abnormalities or hypervolemia to prompt additional intervention at this point.  No indications for dialysis. 2.  Left shoulder septic arthritis/chronic infection with MRSA bacteremia: Status post arthroscopy with irrigation and debridement and currently ongoing follow-up by infectious disease.  On intravenous daptomycin with end date of 08/23/2017 via PICC line. 3.  Anemia: Secondary to malnutrition-inflammatory complex associated with chronic infection/illness as well as iron deficiency.  Current infection precludes intravenous iron therapy at this time even theoretical risk for worsening.  He does not have any overt loss or indications for PRBC transfusion. 4.  Hypertension: Blood pressure well controlled on monotherapy with nifedipine 5.  Chronic hepatitis B and C infections: Currently on tenofovir for hepatitis B infection with plans for pursuing treatment for hepatitis C later per patient.  (Follows up with [facility]) 6.  History of polysubstance abuse  Subjective:   Complains of left shoulder pain and difficulty sleeping overnight.   Objective:   BP 117/78 (BP Location: Right Arm)   Pulse 85   Temp 98.1 F (36.7 C) (Oral)   Resp 16   Ht 5' 7.99" (1.727 m)   Wt 75.3 kg (166 lb 0.1 oz)   SpO2 99%   BMI 25.25 kg/m   Intake/Output Summary (Last 24 hours) at 08/02/2017 0911 Last data filed at 08/02/2017 0517 Gross per 24 hour  Intake 952.5  ml  Output 1050 ml  Net -97.5 ml   Weight change: 1 kg (2 lb 3.3 oz)  Physical Exam: Gen: Appears to be uncomfortable resting in bed on right side CVS: Pulse regular rhythm, normal rate, S1 and S2 normal. Resp: Clear to auscultation-no rales/rhonchi.  Right IJ PICC line Abd: Soft, flat, nontender, bowel sounds normal Ext: Trace-1+ lower extremity edema  Imaging: Ir Fluoro Guide Cv Line Right  Result Date: 08/01/2017 INDICATION: Poor venous access. Chronic renal insufficiency. Request made for placement of a jugular approach tunneled central venous catheter for durable intravenous access for prolonged antibiotic administration. EXAM: TUNNELED CENTRAL VENOUS CATHETER PLACEMENT WITH ULTRASOUND AND FLUOROSCOPIC GUIDANCE MEDICATIONS: Patient is currently admitted to the hospital and receiving intravenous antibiotics. The antibiotic was given in an appropriate time interval prior to skin puncture. ANESTHESIA/SEDATION: None FLUOROSCOPY TIME:  18 seconds (0.8 mGy) COMPLICATIONS: None immediate. PROCEDURE: Informed written consent was obtained from the patient after a discussion of the risks, benefits, and alternatives to treatment. Questions regarding the procedure were encouraged and answered. The right neck and chest were prepped with chlorhexidine in a sterile fashion, and a sterile drape was applied covering the operative field. Maximum barrier sterile technique with sterile gowns and gloves were used for the procedure. A timeout was performed prior to the initiation of the procedure. After the overlying soft tissues were anesthetized, a small venotomy incision was created and a micropuncture kit was utilized to access the internal jugular vein. Real-time ultrasound guidance was utilized for vascular access including the acquisition of a permanent ultrasound image documenting patency of the accessed vessel. The microwire was utilized  to measure appropriate catheter length. The micropuncture sheath was  exchanged for a peel-away sheath over a guidewire. A 5 French single lumen tunneled central venous catheter measuring 27 cm was tunneled in a retrograde fashion from the anterior chest wall to the venotomy incision. The catheter was then placed through the peel-away sheath with tip ultimately positioned at the superior caval-atrial junction. Final catheter positioning was confirmed and documented with a spot radiographic image. The catheter aspirates and flushes normally. The catheter was flushed with appropriate volume heparin dwells. The catheter exit site was secured with a 0-Prolene retention suture. The venotomy incision was closed with Dermabond and Steri-strips. Dressings were applied. The patient tolerated the procedure well without immediate post procedural complication. FINDINGS: After catheter placement, the tip lies within the superior cavoatrial junction. The catheter aspirates and flushes normally and is ready for immediate use. IMPRESSION: Successful placement of 27 cm single lumen tunneled central venous catheter via the right internal jugular vein with tip terminating at the superior caval atrial junction. The catheter is ready for immediate use. Electronically Signed   By: Sandi Mariscal M.D.   On: 08/01/2017 10:59   Ir US Guide Vasc Access Right  Result Date: 08/01/2017 INDICATION: Poor venous access. Chronic renal insufficiency. Request made for placement of a jugular approach tunneled central venous catheter for durable intravenous access for prolonged antibiotic administration. EXAM: TUNNELED CENTRAL VENOUS CATHETER PLACEMENT WITH ULTRASOUND AND FLUOROSCOPIC GUIDANCE MEDICATIONS: Patient is currently admitted to the hospital and receiving intravenous antibiotics. The antibiotic was given in an appropriate time interval prior to skin puncture. ANESTHESIA/SEDATION: None FLUOROSCOPY TIME:  18 seconds (0.8 mGy) COMPLICATIONS: None immediate. PROCEDURE: Informed written consent was obtained from  the patient after a discussion of the risks, benefits, and alternatives to treatment. Questions regarding the procedure were encouraged and answered. The right neck and chest were prepped with chlorhexidine in a sterile fashion, and a sterile drape was applied covering the operative field. Maximum barrier sterile technique with sterile gowns and gloves were used for the procedure. A timeout was performed prior to the initiation of the procedure. After the overlying soft tissues were anesthetized, a small venotomy incision was created and a micropuncture kit was utilized to access the internal jugular vein. Real-time ultrasound guidance was utilized for vascular access including the acquisition of a permanent ultrasound image documenting patency of the accessed vessel. The microwire was utilized to measure appropriate catheter length. The micropuncture sheath was exchanged for a peel-away sheath over a guidewire. A 5 French single lumen tunneled central venous catheter measuring 27 cm was tunneled in a retrograde fashion from the anterior chest wall to the venotomy incision. The catheter was then placed through the peel-away sheath with tip ultimately positioned at the superior caval-atrial junction. Final catheter positioning was confirmed and documented with a spot radiographic image. The catheter aspirates and flushes normally. The catheter was flushed with appropriate volume heparin dwells. The catheter exit site was secured with a 0-Prolene retention suture. The venotomy incision was closed with Dermabond and Steri-strips. Dressings were applied. The patient tolerated the procedure well without immediate post procedural complication. FINDINGS: After catheter placement, the tip lies within the superior cavoatrial junction. The catheter aspirates and flushes normally and is ready for immediate use. IMPRESSION: Successful placement of 27 cm single lumen tunneled central venous catheter via the right internal  jugular vein with tip terminating at the superior caval atrial junction. The catheter is ready for immediate use. Electronically Signed   By: [name]  Watts M.D.   On: 08/01/2017 10:59    Labs: BMET Recent Labs  Lab 07/27/17 0520 07/28/17 0515 07/29/17 0344 07/30/17 0439 07/31/17 0528 08/01/17 0526 08/02/17 0419  NA 140 140 139  138 140 140 137  137 136  137  K 5.1 5.0 4.9  4.9 4.8 4.8 4.2  4.2 4.4  4.4  CL 111 110 109  108 108 105 100*  100* 101  102  CO2 21* 23 23  22 24 24 26  25 24  25   GLUCOSE 118* 90 90  86 92 89 93  92 99  97  BUN 41* 46* 45*  45* 49* 52* 55*  55* 56*  57*  CREATININE 3.34* 3.46* 3.59*  3.63* 3.73* 3.94* 4.06*  4.02* 4.08*  4.05*  CALCIUM 8.3* 8.6* 8.5*  8.4* 8.6* 9.0 8.8*  8.7* 8.7*  8.7*  PHOS  --  4.4 4.6 5.0* 5.8* 5.4* 5.2*   CBC Recent Labs  Lab 07/27/17 0520 07/28/17 0515 07/29/17 0344 07/30/17 0439 07/31/17 0528 08/01/17 0526 08/02/17 0419  WBC 6.3 5.6 5.9 4.7 5.9 5.9 6.1  NEUTROABS 3.6 2.9 3.0 2.4  --   --   --   HGB 7.6* 8.2* 8.1* 8.0* 8.9* 8.3* 7.9*  HCT 23.1* 24.9* 24.9* 24.5* 27.1* 25.3* 24.0*  MCV 97.9 97.3 97.3 98.8 97.1 95.8 96.4  PLT 108* 116* 132* 120* 156 142* 143*    Medications:    . ALPRAZolam  0.5 mg Oral Daily  . citalopram  10 mg Oral Daily  . darbepoetin (ARANESP) injection - NON-DIALYSIS  100 mcg Subcutaneous Q Thu-1800  . feeding supplement (ENSURE ENLIVE)  237 mL Oral BID BM  . lidocaine (PF)  5 mL Other Once  . multivitamin with minerals  1 tablet Oral Daily  . nicotine  21 mg Transdermal Daily  . NIFEdipine  30 mg Oral Daily    Elmarie Shiley, MD 08/02/2017, 9:11 AM

## 2017-08-02 NOTE — Progress Notes (Signed)
Physical Therapy Treatment Patient Details Name: Glenn Alvarado MRN: [id number] DOB: Dec 28, 1965 Today's Date: 08/02/2017    History of Present Illness Glenn Alvarado is a 52yo white male who comes to [facility] on 5/22 after 3 weeks shoulder pain, now presenting s/p Left  GHJ I&D. 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     PT Comments    Pt performed 1032f of gt training bur remains to drift and shuffle with pattern, however no overt LOB noted.  Pt performed DGI with a score of 12 indicative for falls in the elderly.  While patient is not elderly his righting response is delayed.  Performed minimal standing exercise at counter before patient's LUE started to tremor.  Informed nursing and terminated tx.      Follow Up Recommendations  SNF(for IV abx)     Equipment Recommendations  Cane    Recommendations for Other Services       Precautions / Restrictions Precautions Precautions: None Required Braces or Orthoses: Sling(for comfort per ortho note, no order for sling) Restrictions Weight Bearing Restrictions: Yes LUE Weight Bearing: Weight bearing as tolerated    Mobility  Bed Mobility               General bed mobility comments: received sitting EOB   Transfers Overall transfer level: Needs assistance Equipment used: None Transfers: Sit to/from Stand Sit to Stand: Supervision         General transfer comment: safely and slowly, with caution and mild shaking  Ambulation/Gait Ambulation/Gait assistance: Supervision Ambulation Distance (Feet): 1000 Feet Assistive device: None Gait Pattern/deviations: Step-through pattern;Decreased stride length Gait velocity: decreased   General Gait Details: pt with no LOB but shaking increased after first 500'. Pt also with very slow gait and little ability to increase pace. Worked on balance assessment.     Stairs Stairs: Yes Stairs assistance:  Min guard Stair Management: One rail Right Number of Stairs: 12 General stair comments: Min guard to descend and supervision to ascend.     Wheelchair Mobility    Modified Rankin (Stroke Patients Only)       Balance Overall balance assessment: Mild deficits observed, not formally tested                               Standardized Balance Assessment Standardized Balance Assessment : Dynamic Gait Index   Dynamic Gait Index Level Surface: Moderate Impairment Change in Gait Speed: Mild Impairment Gait with Horizontal Head Turns: Moderate Impairment Gait with Vertical Head Turns: Moderate Impairment Gait and Pivot Turn: Moderate Impairment Step Over Obstacle: Mild Impairment Step Around Obstacles: Mild Impairment Steps: Mild Impairment Total Score: 12      Cognition Arousal/Alertness: Awake/alert Behavior During Therapy: WFL for tasks assessed/performed Overall Cognitive Status: History of cognitive impairments - at baseline                                        Exercises General Exercises - Lower Extremity Heel Slides: AROM;Both;10 reps;Standing Hip Flexion/Marching: AROM;Both;10 reps;Standing    General Comments        Pertinent Vitals/Pain Pain Assessment: Faces Pain Score: 5  Pain Location: R chest and L UE, noticeable tremoring in LUE.   Pain Descriptors / Indicators: Aching Pain Intervention(s): Monitored during session;Repositioned    Home Living  Prior Function            PT Goals (current goals can now be found in the care plan section) Acute Rehab PT Goals Patient Stated Goal: reduce pain return to home to be there for daughters (11, 9, 5) Potential to Achieve Goals: Good Progress towards PT goals: Progressing toward goals    Frequency    Min 2X/week      PT Plan Current plan remains appropriate    Co-evaluation              AM-PAC PT "6 Clicks" Daily Activity  Outcome  Measure  Difficulty turning over in bed (including adjusting bedclothes, sheets and blankets)?: None Difficulty moving from lying on back to sitting on the side of the bed? : None Difficulty sitting down on and standing up from a chair with arms (e.g., wheelchair, bedside commode, etc,.)?: None Help needed moving to and from a bed to chair (including a wheelchair)?: None Help needed walking in hospital room?: None Help needed climbing 3-5 steps with a railing? : A Little 6 Click Score: 23    End of Session Equipment Utilized During Treatment: Gait belt Activity Tolerance: Patient tolerated treatment well Patient left: with call bell/phone within reach;in bed Nurse Communication: Mobility status PT Visit Diagnosis: Unsteadiness on feet (R26.81);Other abnormalities of gait and mobility (R26.89);Difficulty in walking, not elsewhere classified (R26.2)     Time: 2197-5883 PT Time Calculation (min) (ACUTE ONLY): 22 min  Charges:  $Therapeutic Activity: 8-22 mins                    G Codes:       Governor Rooks, PTA pager [phone]    Cristela Blue 08/02/2017, 6:18 PM

## 2017-08-03 LAB — RENAL FUNCTION PANEL
ALBUMIN: 2.6 g/dL — AB (ref 3.5–5.0)
ANION GAP: 6 (ref 5–15)
BUN: 59 mg/dL — ABNORMAL HIGH (ref 6–20)
CO2: 23 mmol/L (ref 22–32)
Calcium: 8.5 mg/dL — ABNORMAL LOW (ref 8.9–10.3)
Chloride: 108 mmol/L (ref 101–111)
Creatinine, Ser: 4.3 mg/dL — ABNORMAL HIGH (ref 0.61–1.24)
GFR calc Af Amer: 17 mL/min — ABNORMAL LOW (ref >60)
GFR, EST NON AFRICAN AMERICAN: 15 mL/min — AB (ref >60)
Glucose, Bld: 104 mg/dL — ABNORMAL HIGH (ref 65–99)
PHOSPHORUS: 5.2 mg/dL — AB (ref 2.5–4.6)
Potassium: 4.4 mmol/L (ref 3.5–5.1)
Sodium: 137 mmol/L (ref 135–145)

## 2017-08-03 LAB — COMPREHENSIVE METABOLIC PANEL
ALK PHOS: 86 U/L (ref 38–126)
ALT: 30 U/L (ref 17–63)
AST: 68 U/L — AB (ref 15–41)
Albumin: 2.6 g/dL — ABNORMAL LOW (ref 3.5–5.0)
Anion gap: 6 (ref 5–15)
BILIRUBIN TOTAL: 0.7 mg/dL (ref 0.3–1.2)
BUN: 59 mg/dL — AB (ref 6–20)
CALCIUM: 8.5 mg/dL — AB (ref 8.9–10.3)
CO2: 24 mmol/L (ref 22–32)
Chloride: 108 mmol/L (ref 101–111)
Creatinine, Ser: 4.35 mg/dL — ABNORMAL HIGH (ref 0.61–1.24)
GFR calc Af Amer: 17 mL/min — ABNORMAL LOW (ref >60)
GFR calc non Af Amer: 15 mL/min — ABNORMAL LOW (ref >60)
GLUCOSE: 103 mg/dL — AB (ref 65–99)
POTASSIUM: 4.4 mmol/L (ref 3.5–5.1)
Sodium: 138 mmol/L (ref 135–145)
TOTAL PROTEIN: 8 g/dL (ref 6.5–8.1)

## 2017-08-03 LAB — CBC
HEMATOCRIT: 25.3 % — AB (ref 39.0–52.0)
HEMOGLOBIN: 8.3 g/dL — AB (ref 13.0–17.0)
MCH: 31.3 pg (ref 26.0–34.0)
MCHC: 32.8 g/dL (ref 30.0–36.0)
MCV: 95.5 fL (ref 78.0–100.0)
Platelets: 152 10*3/uL (ref 150–400)
RBC: 2.65 MIL/uL — ABNORMAL LOW (ref 4.22–5.81)
RDW: 14.8 % (ref 11.5–15.5)
WBC: 5.6 10*3/uL (ref 4.0–10.5)

## 2017-08-03 LAB — GLUCOSE, CAPILLARY: Glucose-Capillary: 101 mg/dL — ABNORMAL HIGH (ref 65–99)

## 2017-08-03 MED ORDER — ENOXAPARIN SODIUM 30 MG/0.3ML ~~LOC~~ SOLN
30.0000 mg | SUBCUTANEOUS | Status: DC
  Administered 2017-08-03 – 2017-08-09 (×7): 30 mg via SUBCUTANEOUS
  Filled 2017-08-03 (×7): qty 0.3

## 2017-08-03 NOTE — Progress Notes (Signed)
S: Feels well O:BP (!) 105/59 (BP Location: Right Arm)   Pulse 85   Temp 98.2 F (36.8 C) (Oral)   Resp 18   Ht 5' 7.99" (1.727 m)   Wt 79.7 kg (175 lb 11.3 oz)   SpO2 99%   BMI 26.72 kg/m   Intake/Output Summary (Last 24 hours) at 08/03/2017 1018 Last data filed at 08/03/2017 0934 Gross per 24 hour  Intake 716 ml  Output 2550 ml  Net -1834 ml   Intake/Output: I/O last 3 completed shifts: In: 960 [P.O.:960] Out: 2400 [Urine:2400]  Intake/Output this shift:  Total I/O In: 236 [P.O.:236] Out: 1200 [Urine:1200] Weight change: 4.4 kg (9 lb 11.2 oz) Gen:NAD CVS: no rub Resp: cta Abd: benign Ext: trace to 1+ pedal edema  Recent Labs  Lab 07/28/17 0515 07/29/17 0344 07/30/17 0439 07/31/17 0528 08/01/17 0526 08/02/17 0419 08/03/17 0423  NA 140 139  138 140 140 137  137 136  137 138  137  K 5.0 4.9  4.9 4.8 4.8 4.2  4.2 4.4  4.4 4.4  4.4  CL 110 109  108 108 105 100*  100* 101  102 108  108  CO2 23 23  22 24 24 26  25 24  25 24  23   GLUCOSE 90 90  86 92 89 93  92 99  97 103*  104*  BUN 46* 45*  45* 49* 52* 55*  55* 56*  57* 59*  59*  CREATININE 3.46* 3.59*  3.63* 3.73* 3.94* 4.06*  4.02* 4.08*  4.05* 4.35*  4.30*  ALBUMIN 2.3* 2.4* 2.3* 2.7* 2.6*  2.6* 2.5*  2.4* 2.6*  2.6*  CALCIUM 8.6* 8.5*  8.4* 8.6* 9.0 8.8*  8.7* 8.7*  8.7* 8.5*  8.5*  PHOS 4.4 4.6 5.0* 5.8* 5.4* 5.2* 5.2*  AST  --   --   --   --  73* 68* 68*  ALT  --   --   --   --  30 29 30    Liver Function Tests: Recent Labs  Lab 08/01/17 0526 08/02/17 0419 08/03/17 0423  AST 73* 68* 68*  ALT 30 29 30   ALKPHOS 85 87 86  BILITOT 0.6 0.6 0.7  PROT 8.7* 7.9 8.0  ALBUMIN 2.6*  2.6* 2.5*  2.4* 2.6*  2.6*   No results for input(s): LIPASE, AMYLASE in the last 168 hours. No results for input(s): AMMONIA in the last 168 hours. CBC: Recent Labs  Lab 07/28/17 0515 07/29/17 0344 07/30/17 0439 07/31/17 0528 08/01/17 0526 08/02/17 0419 08/03/17 0423  WBC 5.6 5.9  4.7 5.9 5.9 6.1 5.6  NEUTROABS 2.9 3.0 2.4  --   --   --   --   HGB 8.2* 8.1* 8.0* 8.9* 8.3* 7.9* 8.3*  HCT 24.9* 24.9* 24.5* 27.1* 25.3* 24.0* 25.3*  MCV 97.3 97.3 98.8 97.1 95.8 96.4 95.5  PLT 116* 132* 120* 156 142* 143* 152   Cardiac Enzymes: Recent Labs  Lab 08/01/17 0526  CKTOTAL 64   CBG: Recent Labs  Lab 07/30/17 0751 07/31/17 0754 08/01/17 1003 08/02/17 0810 08/03/17 0736  GLUCAP 87 88 108* 97 101*    Iron Studies: No results for input(s): IRON, TIBC, TRANSFERRIN, FERRITIN in the last 72 hours. Studies/Results: Ir Fluoro Guide Cv Line Right  Result Date: 08/01/2017 INDICATION: Poor venous access. Chronic renal insufficiency. Request made for placement of a jugular approach tunneled central venous catheter for durable intravenous access for prolonged antibiotic administration. EXAM: TUNNELED CENTRAL VENOUS CATHETER PLACEMENT  WITH ULTRASOUND AND FLUOROSCOPIC GUIDANCE MEDICATIONS: Patient is currently admitted to the hospital and receiving intravenous antibiotics. The antibiotic was given in an appropriate time interval prior to skin puncture. ANESTHESIA/SEDATION: None FLUOROSCOPY TIME:  18 seconds (0.8 mGy) COMPLICATIONS: None immediate. PROCEDURE: Informed written consent was obtained from the patient after a discussion of the risks, benefits, and alternatives to treatment. Questions regarding the procedure were encouraged and answered. The right neck and chest were prepped with chlorhexidine in a sterile fashion, and a sterile drape was applied covering the operative field. Maximum barrier sterile technique with sterile gowns and gloves were used for the procedure. A timeout was performed prior to the initiation of the procedure. After the overlying soft tissues were anesthetized, a small venotomy incision was created and a micropuncture kit was utilized to access the internal jugular vein. Real-time ultrasound guidance was utilized for vascular access including the acquisition  of a permanent ultrasound image documenting patency of the accessed vessel. The microwire was utilized to measure appropriate catheter length. The micropuncture sheath was exchanged for a peel-away sheath over a guidewire. A 5 French single lumen tunneled central venous catheter measuring 27 cm was tunneled in a retrograde fashion from the anterior chest wall to the venotomy incision. The catheter was then placed through the peel-away sheath with tip ultimately positioned at the superior caval-atrial junction. Final catheter positioning was confirmed and documented with a spot radiographic image. The catheter aspirates and flushes normally. The catheter was flushed with appropriate volume heparin dwells. The catheter exit site was secured with a 0-Prolene retention suture. The venotomy incision was closed with Dermabond and Steri-strips. Dressings were applied. The patient tolerated the procedure well without immediate post procedural complication. FINDINGS: After catheter placement, the tip lies within the superior cavoatrial junction. The catheter aspirates and flushes normally and is ready for immediate use. IMPRESSION: Successful placement of 27 cm single lumen tunneled central venous catheter via the right internal jugular vein with tip terminating at the superior caval atrial junction. The catheter is ready for immediate use. Electronically Signed   By: Sandi Mariscal M.D.   On: 08/01/2017 10:59   Ir US Guide Vasc Access Right  Result Date: 08/01/2017 INDICATION: Poor venous access. Chronic renal insufficiency. Request made for placement of a jugular approach tunneled central venous catheter for durable intravenous access for prolonged antibiotic administration. EXAM: TUNNELED CENTRAL VENOUS CATHETER PLACEMENT WITH ULTRASOUND AND FLUOROSCOPIC GUIDANCE MEDICATIONS: Patient is currently admitted to the hospital and receiving intravenous antibiotics. The antibiotic was given in an appropriate time interval prior  to skin puncture. ANESTHESIA/SEDATION: None FLUOROSCOPY TIME:  18 seconds (0.8 mGy) COMPLICATIONS: None immediate. PROCEDURE: Informed written consent was obtained from the patient after a discussion of the risks, benefits, and alternatives to treatment. Questions regarding the procedure were encouraged and answered. The right neck and chest were prepped with chlorhexidine in a sterile fashion, and a sterile drape was applied covering the operative field. Maximum barrier sterile technique with sterile gowns and gloves were used for the procedure. A timeout was performed prior to the initiation of the procedure. After the overlying soft tissues were anesthetized, a small venotomy incision was created and a micropuncture kit was utilized to access the internal jugular vein. Real-time ultrasound guidance was utilized for vascular access including the acquisition of a permanent ultrasound image documenting patency of the accessed vessel. The microwire was utilized to measure appropriate catheter length. The micropuncture sheath was exchanged for a peel-away sheath over a guidewire. A 5 [place]  single lumen tunneled central venous catheter measuring 27 cm was tunneled in a retrograde fashion from the anterior chest wall to the venotomy incision. The catheter was then placed through the peel-away sheath with tip ultimately positioned at the superior caval-atrial junction. Final catheter positioning was confirmed and documented with a spot radiographic image. The catheter aspirates and flushes normally. The catheter was flushed with appropriate volume heparin dwells. The catheter exit site was secured with a 0-Prolene retention suture. The venotomy incision was closed with Dermabond and Steri-strips. Dressings were applied. The patient tolerated the procedure well without immediate post procedural complication. FINDINGS: After catheter placement, the tip lies within the superior cavoatrial junction. The catheter aspirates  and flushes normally and is ready for immediate use. IMPRESSION: Successful placement of 27 cm single lumen tunneled central venous catheter via the right internal jugular vein with tip terminating at the superior caval atrial junction. The catheter is ready for immediate use. Electronically Signed   By: Sandi Mariscal M.D.   On: 08/01/2017 10:59   . ALPRAZolam  0.5 mg Oral Daily  . citalopram  10 mg Oral Daily  . darbepoetin (ARANESP) injection - NON-DIALYSIS  100 mcg Subcutaneous Q Thu-1800  . enoxaparin (LOVENOX) injection  30 mg Subcutaneous Q24H  . feeding supplement (ENSURE ENLIVE)  237 mL Oral BID BM  . lidocaine (PF)  5 mL Other Once  . multivitamin with minerals  1 tablet Oral Daily  . nicotine  21 mg Transdermal Daily  . NIFEdipine  30 mg Oral Daily    BMET    Component Value Date/Time   NA 137 08/03/2017 0423   NA 138 08/03/2017 0423   K 4.4 08/03/2017 0423   K 4.4 08/03/2017 0423   CL 108 08/03/2017 0423   CL 108 08/03/2017 0423   CO2 23 08/03/2017 0423   CO2 24 08/03/2017 0423   GLUCOSE 104 (H) 08/03/2017 0423   GLUCOSE 103 (H) 08/03/2017 0423   BUN 59 (H) 08/03/2017 0423   BUN 59 (H) 08/03/2017 0423   CREATININE 4.30 (H) 08/03/2017 0423   CREATININE 4.35 (H) 08/03/2017 0423   CALCIUM 8.5 (L) 08/03/2017 0423   CALCIUM 8.5 (L) 08/03/2017 0423   GFRNONAA 15 (L) 08/03/2017 0423   GFRNONAA 15 (L) 08/03/2017 0423   GFRAA 17 (L) 08/03/2017 0423   GFRAA 17 (L) 08/03/2017 0423   CBC    Component Value Date/Time   WBC 5.6 08/03/2017 0423   RBC 2.65 (L) 08/03/2017 0423   HGB 8.3 (L) 08/03/2017 0423   HCT 25.3 (L) 08/03/2017 0423   PLT 152 08/03/2017 0423   MCV 95.5 08/03/2017 0423   MCH 31.3 08/03/2017 0423   MCHC 32.8 08/03/2017 0423   RDW 14.8 08/03/2017 0423   LYMPHSABS 1.2 07/30/2017 0439   MONOABS 0.7 07/30/2017 0439   EOSABS 0.3 07/30/2017 0439   BASOSABS 0.0 07/30/2017 0439     Assessment/Plan:  1. AKI/CKD stage 3 presumably due to ischemic ATN due to  volume depletion but also with bacteremia and tenofovir.  Good UOP and appears to have reached a plateau.  Continue to follow for now.  No indication for dialysis at this time. 2. MRSA bacteremia c/b septic arthritis- per ID on daptomycin due to AKI and to be treated for 4 weeks of treatment, day #12/28. 3. Chronic [name]- per ID.  Off of tenofovir and will start vimlidy  4. Anemia- due to chronic disease as well as acute illness.  Cont to follow 5.  HTN- stable 6. H/o polysubstance abuse  Donetta Potts, MD [employer] [phone]

## 2017-08-03 NOTE — Progress Notes (Signed)
PROGRESS NOTE    Glenn Alvarado  ZSW:[id number] DOB: 1965-12-12 DOA: 07/22/2017 PCP: Harvie Junior, MD Brief Narrative52 year old male with history of coronary artery disease, hypertension, hyperlipidemia, COPD, depression, anxiety, substance abuse disorder was admitted to [facility] few days ago with acute encephalopathy and questionable suicidal ideation. In the ED at [facility] he was noted to have leukocytosis, renal failure, anion gap metabolic acidosis, elevated bilirubin and AST as well as worsening anemia from his baseline. Urine drug screen was positive for amphetamines and benzodiazepines. Following admission patient improved, his encephalopathy resolved. Due to concern for suicidal ideation psychiatry was consulted and the patient was cleared from psychiatry standpoint. He incidentally was noted to have 1/4 blood cultures positive for MRSA. He has also been complaining of worsening left shoulder pain and underwent the plain imaging as well as a CT scan which was concerning for osteomyelitis of the glenoid humeral joint. Orthopedics were consulted and recommended transfer to a tertiary center and he was moved to [facility] orthopedic surgery and ID were consulted. Underwent an MRI which was positive for septic arthritis and he was taken to the operating room on 5/21     Assessment & Plan:   Principal Problem:   MRSA bacteremia Active Problems:   CAD (coronary artery disease)   Depression with anxiety   Essential hypertension   GERD (gastroesophageal reflux disease)   HLD (hyperlipidemia)   Substance abuse ([place])   AKI (acute kidney injury) ([place])   Pancytopenia ([place])   Acute metabolic encephalopathy   Septic joint of left shoulder region ([place])   Left shoulder pain   Abnormal LFTs   Tobacco abuse   COPD (chronic obstructive pulmonary disease) ([name])   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 Repeat blood cultures negative. Currently on daptomycin for 4 more weeks till June 20. Echo showed no evidence of endocarditis. This patient was transferred here from [facility] morphine.tunneled catheter in place. 2]AKIon CKD 3Nephrology following. Renal ultrasound no obstruction. noted tenofevir stopped to see improvement in aki.  3]history of cirrhosis of the liver/hepatitis B followed at [facility].  Followed by infectious disease.  He is no longer a candidate for entecavir due to AKI.  Process for Medicaid approval for Shirlee Latch is in the process.  4]substance abuse disorder-patient is very upset that the Xanax has been decreased. Continue Celexa.      DVT prophylaxis: SCD Code Status: Full code Family Communication: No family available Disposition Plan: Plan is to discharge him to skilled nursing facility once medically and really stable.  Consultants: Nephro, Ortho, ID Procedures: Left shoulder irrigation and debridement 07/25/2017. Antimicrobials: Daptomycin Subjective: No new complaints.   Objective: Vitals:   08/02/17 1338 08/02/17 2118 08/03/17 0500 08/03/17 0505  BP: 113/67 104/62  (!) 105/59  Pulse: (!) 107 94  85  Resp: 18 18  18   Temp: 98.8 F (37.1 C) 98.1 F (36.7 C)  98.2 F (36.8 C)  TempSrc: Oral Oral  Oral  SpO2: 99% 99%  99%  Weight:   79.7 kg (175 lb 11.3 oz)   Height:        Intake/Output Summary (Last 24 hours) at 08/03/2017 1251 Last data filed at 08/03/2017 1202 Gross per 24 hour  Intake 828.4 ml  Output 2770 ml  Net -1941.6 ml   Filed Weights   08/01/17 0500 08/02/17 0412 08/03/17 0500  Weight: 74.3  kg (163 lb 12.8 oz) 75.3 kg (166 lb 0.1 oz) 79.7 kg (175 lb 11.3 oz)    Examination:  General exam: Appears calm and comfortable  Respiratory system: Clear to auscultation. Respiratory effort normal. Cardiovascular system:  S1 & S2 heard, RRR. No JVD, murmurs, rubs, gallops or clicks. No pedal edema. Gastrointestinal system: Abdomen is nondistended, soft and nontender. No organomegaly or masses felt. Normal bowel sounds heard. Central nervous system: Alert and oriented. No focal neurological deficits. Extremities: 2+ pitting edema bilaterally.  Left shoulder sutures in place. Skin: No rashes, lesions or ulcers Psychiatry: Judgement and insight appear normal. Mood & affect appropriate.     Data Reviewed: I have personally reviewed following labs and imaging studies  CBC: Recent Labs  Lab 07/28/17 0515 07/29/17 0344 07/30/17 0439 07/31/17 0528 08/01/17 0526 08/02/17 0419 08/03/17 0423  WBC 5.6 5.9 4.7 5.9 5.9 6.1 5.6  NEUTROABS 2.9 3.0 2.4  --   --   --   --   HGB 8.2* 8.1* 8.0* 8.9* 8.3* 7.9* 8.3*  HCT 24.9* 24.9* 24.5* 27.1* 25.3* 24.0* 25.3*  MCV 97.3 97.3 98.8 97.1 95.8 96.4 95.5  PLT 116* 132* 120* 156 142* 143* 161   Basic Metabolic Panel: Recent Labs  Lab 07/30/17 0439 07/31/17 0528 08/01/17 0526 08/02/17 0419 08/03/17 0423  NA 140 140 137  137 136  137 138  137  K 4.8 4.8 4.2  4.2 4.4  4.4 4.4  4.4  CL 108 105 100*  100* 101  102 108  108  CO2 24 24 26  25 24  25 24  23   GLUCOSE 92 89 93  92 99  97 103*  104*  BUN 49* 52* 55*  55* 56*  57* 59*  59*  CREATININE 3.73* 3.94* 4.06*  4.02* 4.08*  4.05* 4.35*  4.30*  CALCIUM 8.6* 9.0 8.8*  8.7* 8.7*  8.7* 8.5*  8.5*  PHOS 5.0* 5.8* 5.4* 5.2* 5.2*   GFR: Estimated Creatinine Clearance: 19.9 mL/min (A) (by C-G formula based on SCr of 4.3 mg/dL (H)). Liver Function Tests: Recent Labs  Lab 07/30/17 0439 07/31/17 0528 08/01/17 0526 08/02/17 0419 08/03/17 0423  AST  --   --  73* 68* 68*  ALT  --   --  30 29 30   ALKPHOS  --   --  85 87 86  BILITOT  --   --  0.6 0.6 0.7  PROT  --   --  8.7* 7.9 8.0  ALBUMIN 2.3* 2.7* 2.6*  2.6* 2.5*  2.4* 2.6*  2.6*   No results for input(s): LIPASE, AMYLASE in the last  168 hours. No results for input(s): AMMONIA in the last 168 hours. Coagulation Profile: No results for input(s): INR, PROTIME in the last 168 hours. Cardiac Enzymes: Recent Labs  Lab 08/01/17 0526  CKTOTAL 64   BNP (last 3 results) No results for input(s): PROBNP in the last 8760 hours. HbA1C: No results for input(s): HGBA1C in the last 72 hours. CBG: Recent Labs  Lab 07/30/17 0751 07/31/17 0754 08/01/17 1003 08/02/17 0810 08/03/17 0736  GLUCAP 87 88 108* 97 101*   Lipid Profile: No results for input(s): CHOL, HDL, LDLCALC, TRIG, CHOLHDL, LDLDIRECT in the last 72 hours. Thyroid Function Tests: No results for input(s): TSH, T4TOTAL, FREET4, T3FREE, THYROIDAB in the last 72 hours. Anemia Panel: No results for input(s): VITAMINB12, FOLATE, FERRITIN, TIBC, IRON, RETICCTPCT in the last 72 hours. Sepsis Labs: No results for input(s): PROCALCITON, LATICACIDVEN in the  last 168 hours.  Recent Results (from the past 240 hour(s))  Aerobic/Anaerobic Culture (surgical/deep wound)     Status: None   Collection Time: 07/25/17 11:03 AM  Result Value Ref Range Status   Specimen Description TISSUE LEFT SHOULDER SPEC 1  Final   Special Requests PATIENT ON FOLLOWING ANCEF  Final   Gram Stain   Final    FEW WBC PRESENT, PREDOMINANTLY PMN NO ORGANISMS SEEN    Culture   Final    No growth aerobically or anaerobically. Performed at [facility], [place] [address], [place], [place] [address]    Report Status 07/30/2017 FINAL  Final  Aerobic/Anaerobic Culture (surgical/deep wound)     Status: None   Collection Time: 07/25/17 11:05 AM  Result Value Ref Range Status   Specimen Description TISSUE LEFT SHOULDER SPEC 2  Final   Special Requests PATIENT ON FOLLOWING ANCEF  Final   Gram Stain   Final    FEW WBC PRESENT, PREDOMINANTLY PMN NO ORGANISMS SEEN    Culture   Final    No growth aerobically or anaerobically. Performed at [facility], [place] [address], [place],   [address]    Report Status 07/30/2017 FINAL  Final         Radiology Studies: No results found.      Scheduled Meds: . ALPRAZolam  0.5 mg Oral Daily  . citalopram  10 mg Oral Daily  . darbepoetin (ARANESP) injection - NON-DIALYSIS  100 mcg Subcutaneous Q Thu-1800  . enoxaparin (LOVENOX) injection  30 mg Subcutaneous Q24H  . feeding supplement (ENSURE ENLIVE)  237 mL Oral BID BM  . lidocaine (PF)  5 mL Other Once  . multivitamin with minerals  1 tablet Oral Daily  . nicotine  21 mg Transdermal Daily  . NIFEdipine  30 mg Oral Daily   Continuous Infusions: . sodium chloride 10 mL/hr at 07/24/17 1248  . sodium chloride 10 mL/hr at 08/03/17 1202  . DAPTOmycin (CUBICIN)  IV Stopped (08/03/17 1201)     LOS: 12 days     Georgette Shell, MD [facility]  If 7PM-7AM, please contact night-coverage [email or web address] Password [email or web address] 08/03/2017, 12:51 PM

## 2017-08-04 LAB — COMPREHENSIVE METABOLIC PANEL
ALT: 29 U/L (ref 17–63)
AST: 66 U/L — ABNORMAL HIGH (ref 15–41)
Albumin: 2.5 g/dL — ABNORMAL LOW (ref 3.5–5.0)
Alkaline Phosphatase: 81 U/L (ref 38–126)
Anion gap: 7 (ref 5–15)
BUN: 55 mg/dL — ABNORMAL HIGH (ref 6–20)
CHLORIDE: 105 mmol/L (ref 101–111)
CO2: 23 mmol/L (ref 22–32)
Calcium: 8.4 mg/dL — ABNORMAL LOW (ref 8.9–10.3)
Creatinine, Ser: 4.15 mg/dL — ABNORMAL HIGH (ref 0.61–1.24)
GFR, EST AFRICAN AMERICAN: 18 mL/min — AB (ref >60)
GFR, EST NON AFRICAN AMERICAN: 15 mL/min — AB (ref >60)
Glucose, Bld: 100 mg/dL — ABNORMAL HIGH (ref 65–99)
POTASSIUM: 4.2 mmol/L (ref 3.5–5.1)
SODIUM: 135 mmol/L (ref 135–145)
Total Bilirubin: 0.7 mg/dL (ref 0.3–1.2)
Total Protein: 7.8 g/dL (ref 6.5–8.1)

## 2017-08-04 LAB — CBC
HCT: 23.3 % — ABNORMAL LOW (ref 39.0–52.0)
Hemoglobin: 7.8 g/dL — ABNORMAL LOW (ref 13.0–17.0)
MCH: 32 pg (ref 26.0–34.0)
MCHC: 33.5 g/dL (ref 30.0–36.0)
MCV: 95.5 fL (ref 78.0–100.0)
PLATELETS: 125 10*3/uL — AB (ref 150–400)
RBC: 2.44 MIL/uL — AB (ref 4.22–5.81)
RDW: 14.7 % (ref 11.5–15.5)
WBC: 5.4 10*3/uL (ref 4.0–10.5)

## 2017-08-04 LAB — PHOSPHORUS: PHOSPHORUS: 5.3 mg/dL — AB (ref 2.5–4.6)

## 2017-08-04 LAB — GLUCOSE, CAPILLARY: GLUCOSE-CAPILLARY: 102 mg/dL — AB (ref 65–99)

## 2017-08-04 NOTE — Progress Notes (Signed)
PROGRESS NOTE    Glenn Alvarado  TTS:[id number] DOB: 03/26/1965 DOA: 07/22/2017 PCP: Harvie Junior, MD   Brief Narrative:52 year old male with history of coronary artery disease, hypertension, hyperlipidemia, COPD, depression, anxiety, substance abuse disorder was admitted to [facility] few days ago with acute encephalopathy and questionable suicidal ideation. In the ED at [facility] he was noted to have leukocytosis, renal failure, anion gap metabolic acidosis, elevated bilirubin and AST as well as worsening anemia from his baseline. Urine drug screen was positive for amphetamines and benzodiazepines. Following admission patient improved, his encephalopathy resolved. Due to concern for suicidal ideation psychiatry was consulted and the patient was cleared from psychiatry standpoint. He incidentally was noted to have 1/4 blood cultures positive for MRSA. He has also been complaining of worsening left shoulder pain and underwent the plain imaging as well as a CT scan which was concerning for osteomyelitis of the glenoid humeral joint. Orthopedics were consulted and recommended transfer to a tertiary center and he was moved to [facility] orthopedic surgery and ID were consulted. Underwent an MRI which was positive for septic arthritis and he was taken to the operating room on 5/21     Assessment & Plan:   Principal Problem:   MRSA bacteremia Active Problems:   CAD (coronary artery disease)   Depression with anxiety   Essential hypertension   GERD (gastroesophageal reflux disease)   HLD (hyperlipidemia)   Substance abuse ([place])   AKI (acute kidney injury) ([place])   Pancytopenia ([place])   Acute metabolic encephalopathy   Septic joint of left shoulder region ([place])   Left shoulder pain   Abnormal LFTs   Tobacco abuse   COPD (chronic obstructive pulmonary disease) ([name])   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 Repeat blood cultures negative. Currently on daptomycin  till June 16. Echo showed no evidence of endocarditis. This patient was transferred here from [facility] morphine.tunneled catheter in place. 2]AKIon CKD 3Nephrology following. Renal ultrasound no obstruction. noted tenofevir stopped to see improvement in aki.  3]history of cirrhosis of the liver/hepatitis B followed at [facility].  Followed by infectious disease.  He is no longer a candidate for entecavir due to AKI.  Process for Medicaid approval for Shirlee Latch is in the process.  4]substance abuse disorder-patient is very upset that the Xanax has been decreased. Continue Celexa.     DVT prophylaxis: scd Code Status:full Family Communication:none Disposition Plan: Hopefully he can be discharged early next week to a skilled nursing facility to finish the course of IV antibiotics and sutures in the left shoulder can be taken off prior to discharge.  Consultants: Ortho, and ID, nephrology  Procedures: Left shoulder irrigation and debridement 07/25/2017 Antimicrobials: Daptomycin  Subjective: Patient awake alert sitting up in side of the bed trying to eat breakfast.  Objective: Vitals:   08/03/17 1308 08/03/17 2139 08/04/17 0504 08/04/17 1357  BP: 130/79 116/66 113/67 136/86  Pulse: 93 95 95 97  Resp: 16 16 16 17   Temp: 97.7 F (36.5 C) 97.9 F (36.6 C) 98.1 F (36.7 C) 98.1 F (36.7 C)  TempSrc: Oral Oral Oral Oral  SpO2: 100% 100% 100% 100%  Weight:   78.4 kg (172 lb 13.5 oz)   Height:        Intake/Output Summary (Last 24 hours) at 08/04/2017 1522 Last data filed at 08/04/2017 1357 Gross per 24 hour  Intake 2838.33 ml  Output 3875 ml  Net -1036.67 ml   Filed Weights   08/02/17 0412 08/03/17 0500 08/04/17 0504  Weight: 75.3 kg (166 lb 0.1 oz) 79.7 kg (175 lb 11.3 oz) 78.4 kg (172 lb 13.5 oz)     Examination:  General exam: Appears calm and comfortable  Respiratory system: Clear to auscultation. Respiratory effort normal. Cardiovascular system: S1 & S2 heard, RRR. No JVD, murmurs, rubs, gallops or clicks. No pedal edema. Gastrointestinal system: Abdomen is nondistended, soft and nontender. No organomegaly or masses felt. Normal bowel sounds heard. Central nervous system: Alert and oriented. No focal neurological deficits. Extremities: 2+ edema.  Sutures in the left shoulder.   Skin: No rashes, lesions or ulcers Psychiatry: Judgement and insight appear normal. Mood & affect appropriate.     Data Reviewed: I have personally reviewed following labs and imaging studies  CBC: Recent Labs  Lab 07/29/17 0344 07/30/17 0439 07/31/17 0528 08/01/17 0526 08/02/17 0419 08/03/17 0423 08/04/17 0428  WBC 5.9 4.7 5.9 5.9 6.1 5.6 5.4  NEUTROABS 3.0 2.4  --   --   --   --   --   HGB 8.1* 8.0* 8.9* 8.3* 7.9* 8.3* 7.8*  HCT 24.9* 24.5* 27.1* 25.3* 24.0* 25.3* 23.3*  MCV 97.3 98.8 97.1 95.8 96.4 95.5 95.5  PLT 132* 120* 156 142* 143* 152 726*   Basic Metabolic Panel: Recent Labs  Lab 07/31/17 0528 08/01/17 0526 08/02/17 0419 08/03/17 0423 08/04/17 0428  NA 140 137  137 136  137 138  137 135  K 4.8 4.2  4.2 4.4  4.4 4.4  4.4 4.2  CL 105 100*  100* 101  102 108  108 105  CO2 24 26  25 24  25 24  23 23   GLUCOSE 89 93  92 99  97 103*  104* 100*  BUN 52* 55*  55* 56*  57* 59*  59* 55*  CREATININE 3.94* 4.06*  4.02* 4.08*  4.05* 4.35*  4.30* 4.15*  CALCIUM 9.0 8.8*  8.7* 8.7*  8.7* 8.5*  8.5* 8.4*  PHOS 5.8* 5.4* 5.2* 5.2* 5.3*   GFR: Estimated Creatinine Clearance: 20.6 mL/min (A) (by C-G formula based on SCr of 4.15 mg/dL (H)). Liver Function Tests: Recent Labs  Lab 07/31/17 0528 08/01/17 0526 08/02/17 0419 08/03/17 0423 08/04/17 0428  AST  --  73* 68* 68* 66*  ALT  --  30 29 30 29   ALKPHOS  --  85 87 86 81  BILITOT  --  0.6 0.6 0.7 0.7   PROT  --  8.7* 7.9 8.0 7.8  ALBUMIN 2.7* 2.6*  2.6* 2.5*  2.4* 2.6*  2.6* 2.5*   No results for input(s): LIPASE, AMYLASE in the last 168 hours. No results for input(s): AMMONIA in the last 168 hours. Coagulation Profile: No results for input(s): INR, PROTIME in the last 168 hours. Cardiac Enzymes: Recent Labs  Lab 08/01/17 0526  CKTOTAL 64   BNP (last 3 results) No results for input(s): PROBNP in the last 8760 hours. HbA1C: No results for input(s): HGBA1C in the last 72 hours. CBG: Recent Labs  Lab 07/31/17 0754 08/01/17 1003 08/02/17 0810 08/03/17 0736 08/04/17 0810  GLUCAP 88 108* 97 101* 102*   Lipid Profile: No results for input(s): CHOL, HDL, LDLCALC, TRIG, CHOLHDL, LDLDIRECT in the last 72 hours. Thyroid Function Tests: No results for input(s): TSH, T4TOTAL, FREET4, T3FREE, THYROIDAB in the last 72 hours. Anemia Panel: No results for input(s): VITAMINB12, FOLATE, FERRITIN,  TIBC, IRON, RETICCTPCT in the last 72 hours. Sepsis Labs: No results for input(s): PROCALCITON, LATICACIDVEN in the last 168 hours.  No results found for this or any previous visit (from the past 240 hour(s)).       Radiology Studies: No results found.      Scheduled Meds: . ALPRAZolam  0.5 mg Oral Daily  . citalopram  10 mg Oral Daily  . darbepoetin (ARANESP) injection - NON-DIALYSIS  100 mcg Subcutaneous Q Thu-1800  . enoxaparin (LOVENOX) injection  30 mg Subcutaneous Q24H  . feeding supplement (ENSURE ENLIVE)  237 mL Oral BID BM  . lidocaine (PF)  5 mL Other Once  . multivitamin with minerals  1 tablet Oral Daily  . nicotine  21 mg Transdermal Daily  . NIFEdipine  30 mg Oral Daily   Continuous Infusions: . sodium chloride 10 mL/hr at 07/24/17 1248  . sodium chloride 10 mL/hr at 08/03/17 1202  . DAPTOmycin (CUBICIN)  IV Stopped (08/03/17 1201)     LOS: 13 days     Georgette Shell, MD [facility] If 7PM-7AM, please contact  night-coverage [email or web address] Password [email or web address] 08/04/2017, 3:22 PM

## 2017-08-04 NOTE — Progress Notes (Signed)
Subjective: Interval History: has complaints sore at cath site, and shoulder.  Objective: Vital signs in last 24 hours: Temp:  [97.7 F (36.5 C)-98.1 F (36.7 C)] 98.1 F (36.7 C) (06/01 0504) Pulse Rate:  [93-95] 95 (06/01 0504) Resp:  [16] 16 (06/01 0504) BP: (113-130)/(66-79) 113/67 (06/01 0504) SpO2:  [100 %] 100 % (06/01 0504) Weight:  [78.4 kg (172 lb 13.5 oz)] 78.4 kg (172 lb 13.5 oz) (06/01 0504) Weight change: -1.3 kg (-2 lb 13.9 oz)  Intake/Output from previous day: 05/31 0701 - 06/01 0700 In: 2982.7 [P.O.:836; I.V.:2034.3; IV Piggyback:112.4] Out: 4545 [Urine:4545] Intake/Output this shift: Total I/O In: -  Out: 300 [Urine:300]  General appearance: alert, cooperative, no distress and pale Resp: diminished breath sounds bilaterally Chest wall: R central line, no swelling Cardio: S1, S2 normal and systolic murmur: systolic ejection 2/6, decrescendo at 2nd left intercostal space GI: soft, pos bs, liver down 5 cm Extremities: edema 1+ and L shoulder with some swelling, incision  Lab Results: Recent Labs    08/03/17 0423 08/04/17 0428  WBC 5.6 5.4  HGB 8.3* 7.8*  HCT 25.3* 23.3*  PLT 152 125*   BMET:  Recent Labs    08/03/17 0423 08/04/17 0428  NA 138  137 135  K 4.4  4.4 4.2  CL 108  108 105  CO2 24  23 23   GLUCOSE 103*  104* 100*  BUN 59*  59* 55*  CREATININE 4.35*  4.30* 4.15*  CALCIUM 8.5*  8.5* 8.4*   No results for input(s): PTH in the last 72 hours. Iron Studies: No results for input(s): IRON, TIBC, TRANSFERRIN, FERRITIN in the last 72 hours.  Studies/Results: No results found.  I have reviewed the patient's current medications.  Assessment/Plan: 1 AKI  Plateau of function, vol/acid/base/k ok.  Should recover with time.  Sepsis/Vanc/Tenof etio 2 Anemia  3 MRSA in shoulder 4 Cirrhosis with [name]/C P diet, follow chem , vol, AB.  If stable can d/c in 1-2 d from our standpoint and f/u outpatient    LOS: 13 days   Jeneen Rinks  Littie Chiem 08/04/2017,8:29 AM

## 2017-08-04 NOTE — Progress Notes (Signed)
ID PROGRESS NOTE   52yo M with chronic [name] cirrhosis and CKD found to have MRSA bacteremia with TEE negative, though has septic arthritis to left shoulder s/p Ix D. Developed acute on chronic kidney disease with Cr now at 4. Cr Cl 20.  - currently on daptomycin to take til June 16th to finish 28 day treatment course  - have stopped his entecavir due to increased Cr and will transition to tenofovir alfanemide (TAF) aka vimlidy. We have sent in for prior authorization to his insurance on Friday. Anticipate to know the answer on Monday in order to restart his medication for hep b treatment  - transaminitis appear stable on today's labs  Sheyann Sulton B. [place] [facility] [phone]

## 2017-08-05 LAB — COMPREHENSIVE METABOLIC PANEL WITH GFR
ALT: 31 U/L (ref 17–63)
AST: 71 U/L — ABNORMAL HIGH (ref 15–41)
Albumin: 2.6 g/dL — ABNORMAL LOW (ref 3.5–5.0)
Alkaline Phosphatase: 86 U/L (ref 38–126)
Anion gap: 8 (ref 5–15)
BUN: 56 mg/dL — ABNORMAL HIGH (ref 6–20)
CO2: 24 mmol/L (ref 22–32)
Calcium: 8.6 mg/dL — ABNORMAL LOW (ref 8.9–10.3)
Chloride: 104 mmol/L (ref 101–111)
Creatinine, Ser: 4.11 mg/dL — ABNORMAL HIGH (ref 0.61–1.24)
GFR calc Af Amer: 18 mL/min — ABNORMAL LOW
GFR calc non Af Amer: 16 mL/min — ABNORMAL LOW
Glucose, Bld: 100 mg/dL — ABNORMAL HIGH (ref 65–99)
Potassium: 4.3 mmol/L (ref 3.5–5.1)
Sodium: 136 mmol/L (ref 135–145)
Total Bilirubin: 0.7 mg/dL (ref 0.3–1.2)
Total Protein: 7.9 g/dL (ref 6.5–8.1)

## 2017-08-05 LAB — RENAL FUNCTION PANEL
ANION GAP: 6 (ref 5–15)
Albumin: 2.2 g/dL — ABNORMAL LOW (ref 3.5–5.0)
BUN: 44 mg/dL — ABNORMAL HIGH (ref 6–20)
CHLORIDE: 113 mmol/L — AB (ref 101–111)
CO2: 19 mmol/L — AB (ref 22–32)
Calcium: 6.8 mg/dL — ABNORMAL LOW (ref 8.9–10.3)
Creatinine, Ser: 3.16 mg/dL — ABNORMAL HIGH (ref 0.61–1.24)
GFR calc non Af Amer: 21 mL/min — ABNORMAL LOW (ref >60)
GFR, EST AFRICAN AMERICAN: 25 mL/min — AB (ref >60)
Glucose, Bld: 92 mg/dL (ref 65–99)
Phosphorus: 3.8 mg/dL (ref 2.5–4.6)
Potassium: 3 mmol/L — ABNORMAL LOW (ref 3.5–5.1)
Sodium: 138 mmol/L (ref 135–145)

## 2017-08-05 LAB — GLUCOSE, CAPILLARY: Glucose-Capillary: 98 mg/dL (ref 65–99)

## 2017-08-05 LAB — PHOSPHORUS: PHOSPHORUS: 4.9 mg/dL — AB (ref 2.5–4.6)

## 2017-08-05 NOTE — Progress Notes (Signed)
Subjective: Interval History: has complaints not getting enough medicine.  Objective: Vital signs in last 24 hours: Temp:  [98.1 F (36.7 C)-98.4 F (36.9 C)] 98.2 F (36.8 C) (06/02 0354) Pulse Rate:  [91-97] 91 (06/02 0354) Resp:  [15-17] 16 (06/02 0354) BP: (105-136)/(55-86) 122/67 (06/02 0354) SpO2:  [99 %-100 %] 99 % (06/02 0354) Weight:  [78.5 kg (173 lb 1 oz)] 78.5 kg (173 lb 1 oz) (06/02 0500) Weight change: 0.1 kg (3.5 oz)  Intake/Output from previous day: 06/01 0701 - 06/02 0700 In: 1366 [P.O.:1366] Out: 2700 [Urine:2700] Intake/Output this shift: No intake/output data recorded.  General appearance: cooperative, no distress, pale and slowed mentation Resp: diminished breath sounds bilaterally and rales bibasilar Chest wall: R central line Cardio: S1, S2 normal and systolic murmur: systolic ejection 2/6, decrescendo at 2nd left intercostal space GI: pos bs, liver down 5 cm Extremities: edema 2+  Lab Results: Recent Labs    08/03/17 0423 08/04/17 0428  WBC 5.6 5.4  HGB 8.3* 7.8*  HCT 25.3* 23.3*  PLT 152 125*   BMET:  Recent Labs    08/04/17 0428 08/05/17 0440  NA 135 136  K 4.2 4.3  CL 105 104  CO2 23 24  GLUCOSE 100* 100*  BUN 55* 56*  CREATININE 4.15* 4.11*  CALCIUM 8.4* 8.6*   No results for input(s): PTH in the last 72 hours. Iron Studies: No results for input(s): IRON, TIBC, TRANSFERRIN, FERRITIN in the last 72 hours.  Studies/Results: No results found.  I have reviewed the patient's current medications.  Assessment/Plan: 1 AKI Slow declining Cr. Vol xs, othewise stable. Diuresing on his own. Sepsis, tenofovir 2 MRSA sepsis/ joint infx 3 Cirrhosis 4 Substance abuse 5 CAD 6 HTN bp lower, stop meds P AB, follow Cr, allow to diurese   LOS: 14 days   Kaila Devries 08/05/2017,7:38 AM

## 2017-08-05 NOTE — Progress Notes (Signed)
PROGRESS NOTE    Glenn Alvarado  OYD:[id number] DOB: Feb 02, 1966 DOA: 07/22/2017 PCP: Harvie Junior, MD  Brief Narrative:52 year old male with history of coronary artery disease, hypertension, hyperlipidemia, COPD, depression, anxiety, substance abuse disorder was admitted to [facility] few days ago with acute encephalopathy and questionable suicidal ideation. In the ED at [facility] he was noted to have leukocytosis, renal failure, anion gap metabolic acidosis, elevated bilirubin and AST as well as worsening anemia from his baseline. Urine drug screen was positive for amphetamines and benzodiazepines. Following admission patient improved, his encephalopathy resolved. Due to concern for suicidal ideation psychiatry was consulted and the patient was cleared from psychiatry standpoint. He incidentally was noted to have 1/4 blood cultures positive for MRSA. He has also been complaining of worsening left shoulder pain and underwent the plain imaging as well as a CT scan which was concerning for osteomyelitis of the glenoid humeral joint. Orthopedics were consulted and recommended transfer to a tertiary center and he was moved to [facility] orthopedic surgery and ID were consulted. Underwent an MRI which was positive for septic arthritis and he was taken to the operating room on 5/21     Assessment & Plan:   Principal Problem:   MRSA bacteremia Active Problems:   CAD (coronary artery disease)   Depression with anxiety   Essential hypertension   GERD (gastroesophageal reflux disease)   HLD (hyperlipidemia)   Substance abuse ([place])   AKI (acute kidney injury) ([place])   Pancytopenia ([place])   Acute metabolic encephalopathy   Septic joint of left shoulder region ([place])   Left shoulder pain   Abnormal LFTs   Tobacco abuse   COPD (chronic obstructive pulmonary disease) ([name])   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 Repeat blood cultures negative. Currently on daptomycin  till June 16. Echo showed no evidence of endocarditis. This patient was transferred here from [facility] morphine.tunneled catheter in place. 2]AKIon CKD 3Nephrology following. Renal ultrasound no obstruction. noted tenofevir stopped to see improvement in aki.slowly recovering.  3]history of cirrhosis of the liver/hepatitis B followed at [facility] by infectious disease. He is no longer a candidate for entecavir due to AKI. Process for Medicaid approval for Shirlee Latch is in the process.  4]substance abuse disorder-patient is very upset that the Xanax has been decreased. Continue Celexa.      DVT prophylaxis SCD Code Status: Full code no family available Family Communication: Disposition Plan: Plan is to discharge to skilled nursing facility in [place] once renal function is stable.  Cardio Consultants:  Infectious disease, nephrology, Ortho Procedures: Left shoulder irrigation and debridement 07/25/2017 Antimicrobials daptomycin  Subjective: Patient is resting in bed complaining of wanting more pain medications.   Objective: Vitals:   08/04/17 1357 08/04/17 2056 08/05/17 0354 08/05/17 0500  BP: 136/86 (!) 105/55 122/67   Pulse: 97 93 91   Resp: 17 15 16    Temp: 98.1 F (36.7 C) 98.4 F (36.9 C) 98.2 F (36.8 C)   TempSrc: Oral Oral Oral   SpO2: 100% 100% 99%   Weight:    78.5 kg (173 lb 1 oz)  Height:        Intake/Output Summary (Last 24 hours) at 08/05/2017 0921 Last data filed at 08/05/2017 0600 Gross per 24 hour  Intake 1366 ml  Output 2400 ml  Net -1034 ml   Filed Weights   08/03/17 0500  08/04/17 0504 08/05/17 0500  Weight: 79.7 kg (175 lb 11.3 oz) 78.4 kg (172 lb 13.5 oz) 78.5 kg (173 lb 1 oz)    Examination:  General exam: Appears calm and comfortable  Respiratory system:  Clear to auscultation. Respiratory effort normal. Cardiovascular system: S1 & S2 heard, RRR. No JVD, murmurs, rubs, gallops or clicks. No pedal edema. Gastrointestinal system: Abdomen is nondistended, soft and nontender. No organomegaly or masses felt. Normal bowel sounds heard. Central nervous system: Alert and oriented. No focal neurological deficits. Extremities: Symmetric 5 x 5 power. Skin: No rashes, lesions or ulcers Psychiatry: Judgement and insight appear normal. Mood & affect appropriate.     Data Reviewed: I have personally reviewed following labs and imaging studies  CBC: Recent Labs  Lab 07/30/17 0439 07/31/17 0528 08/01/17 0526 08/02/17 0419 08/03/17 0423 08/04/17 0428  WBC 4.7 5.9 5.9 6.1 5.6 5.4  NEUTROABS 2.4  --   --   --   --   --   HGB 8.0* 8.9* 8.3* 7.9* 8.3* 7.8*  HCT 24.5* 27.1* 25.3* 24.0* 25.3* 23.3*  MCV 98.8 97.1 95.8 96.4 95.5 95.5  PLT 120* 156 142* 143* 152 704*   Basic Metabolic Panel: Recent Labs  Lab 08/01/17 0526 08/02/17 0419 08/03/17 0423 08/04/17 0428 08/05/17 0440  NA 137  137 136  137 138  137 135 136  K 4.2  4.2 4.4  4.4 4.4  4.4 4.2 4.3  CL 100*  100* 101  102 108  108 105 104  CO2 26  25 24  25 24  23 23 24   GLUCOSE 93  92 99  97 103*  104* 100* 100*  BUN 55*  55* 56*  57* 59*  59* 55* 56*  CREATININE 4.06*  4.02* 4.08*  4.05* 4.35*  4.30* 4.15* 4.11*  CALCIUM 8.8*  8.7* 8.7*  8.7* 8.5*  8.5* 8.4* 8.6*  PHOS 5.4* 5.2* 5.2* 5.3* 4.9*   GFR: Estimated Creatinine Clearance: 20.8 mL/min (A) (by C-G formula based on SCr of 4.11 mg/dL (H)). Liver Function Tests: Recent Labs  Lab 08/01/17 0526 08/02/17 0419 08/03/17 0423 08/04/17 0428 08/05/17 0440  AST 73* 68* 68* 66* 71*  ALT 30 29 30 29 31   ALKPHOS 85 87 86 81 86  BILITOT 0.6 0.6 0.7 0.7 0.7  PROT 8.7* 7.9 8.0 7.8 7.9  ALBUMIN 2.6*  2.6* 2.5*  2.4* 2.6*  2.6* 2.5* 2.6*   No results for input(s): LIPASE, AMYLASE in the last 168 hours. No  results for input(s): AMMONIA in the last 168 hours. Coagulation Profile: No results for input(s): INR, PROTIME in the last 168 hours. Cardiac Enzymes: Recent Labs  Lab 08/01/17 0526  CKTOTAL 64   BNP (last 3 results) No results for input(s): PROBNP in the last 8760 hours. HbA1C: No results for input(s): HGBA1C in the last 72 hours. CBG: Recent Labs  Lab 08/01/17 1003 08/02/17 0810 08/03/17 0736 08/04/17 0810 08/05/17 0740  GLUCAP 108* 97 101* 102* 98   Lipid Profile: No results for input(s): CHOL, HDL, LDLCALC, TRIG, CHOLHDL, LDLDIRECT in the last 72 hours. Thyroid Function Tests: No results for input(s): TSH, T4TOTAL, FREET4, T3FREE, THYROIDAB in the last 72 hours. Anemia Panel: No results for input(s): VITAMINB12, FOLATE, FERRITIN, TIBC, IRON, RETICCTPCT in the last 72 hours. Sepsis Labs: No results for input(s): PROCALCITON, LATICACIDVEN in the last 168 hours.  No results found for this or any previous visit (from the past 240 hour(s)).  Radiology Studies: No results found.      Scheduled Meds: . ALPRAZolam  0.5 mg Oral Daily  . citalopram  10 mg Oral Daily  . darbepoetin (ARANESP) injection - NON-DIALYSIS  100 mcg Subcutaneous Q Thu-1800  . enoxaparin (LOVENOX) injection  30 mg Subcutaneous Q24H  . feeding supplement (ENSURE ENLIVE)  237 mL Oral BID BM  . lidocaine (PF)  5 mL Other Once  . multivitamin with minerals  1 tablet Oral Daily  . nicotine  21 mg Transdermal Daily   Continuous Infusions: . DAPTOmycin (CUBICIN)  IV Stopped (08/03/17 1201)     LOS: 14 days     Georgette Shell, MD [facility]  If 7PM-7AM, please contact night-coverage [email or web address] Password [facility] 08/05/2017, 9:21 AM

## 2017-08-06 DIAGNOSIS — B181 Chronic viral hepatitis B without delta-agent: Secondary | ICD-10-CM

## 2017-08-06 DIAGNOSIS — N189 Chronic kidney disease, unspecified: Secondary | ICD-10-CM

## 2017-08-06 DIAGNOSIS — I2510 Atherosclerotic heart disease of native coronary artery without angina pectoris: Secondary | ICD-10-CM

## 2017-08-06 DIAGNOSIS — B9562 Methicillin resistant Staphylococcus aureus infection as the cause of diseases classified elsewhere: Secondary | ICD-10-CM

## 2017-08-06 DIAGNOSIS — B182 Chronic viral hepatitis C: Secondary | ICD-10-CM

## 2017-08-06 DIAGNOSIS — R7881 Bacteremia: Secondary | ICD-10-CM

## 2017-08-06 DIAGNOSIS — M00012 Staphylococcal arthritis, left shoulder: Secondary | ICD-10-CM

## 2017-08-06 LAB — RENAL FUNCTION PANEL
ANION GAP: 9 (ref 5–15)
Albumin: 2.5 g/dL — ABNORMAL LOW (ref 3.5–5.0)
BUN: 54 mg/dL — ABNORMAL HIGH (ref 6–20)
CO2: 26 mmol/L (ref 22–32)
Calcium: 8.5 mg/dL — ABNORMAL LOW (ref 8.9–10.3)
Chloride: 103 mmol/L (ref 101–111)
Creatinine, Ser: 3.91 mg/dL — ABNORMAL HIGH (ref 0.61–1.24)
GFR calc non Af Amer: 17 mL/min — ABNORMAL LOW (ref >60)
GFR, EST AFRICAN AMERICAN: 19 mL/min — AB (ref >60)
Glucose, Bld: 118 mg/dL — ABNORMAL HIGH (ref 65–99)
POTASSIUM: 3.9 mmol/L (ref 3.5–5.1)
Phosphorus: 4.9 mg/dL — ABNORMAL HIGH (ref 2.5–4.6)
Sodium: 138 mmol/L (ref 135–145)

## 2017-08-06 LAB — GLUCOSE, CAPILLARY: Glucose-Capillary: 90 mg/dL (ref 65–99)

## 2017-08-06 MED ORDER — TENOFOVIR ALAFENAMIDE FUMARATE 25 MG PO TABS
25.0000 mg | ORAL_TABLET | Freq: Every morning | ORAL | Status: DC
  Administered 2017-08-06 – 2017-08-19 (×14): 25 mg via ORAL
  Filled 2017-08-06 (×16): qty 1

## 2017-08-06 NOTE — Progress Notes (Signed)
Patient ID: Glenn Alvarado, male   DOB: [birth year], 52 y.o.   MRN: [id number] [facility] Progress Note   Assessment/ Plan:   1.  Acute kidney injury on chronic kidney disease stage III: Likely from ATN with decreased oral intake/volume depletion with ongoing tenofovir/bacteremia.  He is nonoliguric with improving creatinine noted-no labs from this morning.  No indications for dialysis. 2.  Left shoulder septic arthritis/chronic infection with MRSA bacteremia: Status post arthroscopy with irrigation and debridement and currently ongoing follow-up by infectious disease.  On intravenous daptomycin with end date of 08/23/2017 via PICC line. 3.  Anemia: Secondary to malnutrition-inflammatory complex associated with chronic infection/illness as well as iron deficiency.  Will delay intravenous iron therapy for now. 4.  Hypertension: Blood pressure well controlled on monotherapy with nifedipine 5.  Chronic hepatitis B and C infections: Currently on tenofovir for hepatitis B infection with plans for pursuing treatment for hepatitis C later per patient.  (Follows up with [facility]) 6.  History of polysubstance abuse 7.  Hypokalemia: Recheck labs again this morning to assess need for replacement  Subjective:   Continues to complain of intermittent left shoulder pain and difficulty sleeping overnight due to positioning-once more pain medications.   Objective:   BP 118/64 (BP Location: Right Arm)   Pulse 97   Temp 98.1 F (36.7 C) (Oral)   Resp 17   Ht 5' 7.99" (1.727 m)   Wt 78.2 kg (172 lb 6.4 oz)   SpO2 100%   BMI 26.22 kg/m   Intake/Output Summary (Last 24 hours) at 08/06/2017 1020 Last data filed at 08/06/2017 0900 Gross per 24 hour  Intake 2355 ml  Output 2950 ml  Net -595 ml   Weight change: -0.3 kg (-10.6 oz)  Physical Exam: Gen: Appears to be comfortable resting in bed until woken up CVS: Pulse regular rhythm, normal rate, S1 and S2 normal. Resp: Clear to  auscultation-no rales/rhonchi.  Right IJ PICC line Abd: Soft, flat, nontender, bowel sounds normal Ext: 1+ lower extremity edema  Imaging: No results found.  Labs: BMET Recent Labs  Lab 07/31/17 0528 08/01/17 0526 08/02/17 0419 08/03/17 0423 08/04/17 0428 08/05/17 0440 08/05/17 1340  NA 140 137  137 136  137 138  137 135 136 138  K 4.8 4.2  4.2 4.4  4.4 4.4  4.4 4.2 4.3 3.0*  CL 105 100*  100* 101  102 108  108 105 104 113*  CO2 24 26  25 24  25 24  23 23 24  19*  GLUCOSE 89 93  92 99  97 103*  104* 100* 100* 92  BUN 52* 55*  55* 56*  57* 59*  59* 55* 56* 44*  CREATININE 3.94* 4.06*  4.02* 4.08*  4.05* 4.35*  4.30* 4.15* 4.11* 3.16*  CALCIUM 9.0 8.8*  8.7* 8.7*  8.7* 8.5*  8.5* 8.4* 8.6* 6.8*  PHOS 5.8* 5.4* 5.2* 5.2* 5.3* 4.9* 3.8   CBC Recent Labs  Lab 08/01/17 0526 08/02/17 0419 08/03/17 0423 08/04/17 0428  WBC 5.9 6.1 5.6 5.4  HGB 8.3* 7.9* 8.3* 7.8*  HCT 25.3* 24.0* 25.3* 23.3*  MCV 95.8 96.4 95.5 95.5  PLT 142* 143* 152 125*    Medications:    . ALPRAZolam  0.5 mg Oral Daily  . citalopram  10 mg Oral Daily  . darbepoetin (ARANESP) injection - NON-DIALYSIS  100 mcg Subcutaneous Q Thu-1800  . enoxaparin (LOVENOX) injection  30 mg Subcutaneous Q24H  . feeding supplement (ENSURE ENLIVE)  237 mL Oral BID BM  . lidocaine (PF)  5 mL Other Once  . multivitamin with minerals  1 tablet Oral Daily  . nicotine  21 mg Transdermal Daily    Elmarie Shiley, MD 08/06/2017, 10:20 AM

## 2017-08-06 NOTE — Progress Notes (Signed)
    [place] for Infectious Disease   Date of Admission:  07/22/2017  Antibiotics: Vancomycin 5/19 Zosyn 5/19 Cefazolin 5/22 Daptomycin 5/21 >>  Subjective: Reports some stomach discomfort overnight and soreness at left shoulder.  Objective:  Vitals:   08/05/17 2055 08/06/17 0517  BP: 125/72 118/64  Pulse: (!) 101 97  Resp:  17  Temp: 98.1 F (36.7 C) 98.1 F (36.7 C)  SpO2: 100% 100%  General: resting in bed, no acute distress Cardiac: RRR, no rubs, murmurs or gallops Pulm: clear to auscultation anteriorly Abd: soft, nontender Ext: sutures in place left shoulder, Central line right chest wall Neuro: alert and oriented X3   Lab Results Lab Results  Component Value Date   WBC 5.4 08/04/2017   HGB 7.8 (L) 08/04/2017   HCT 23.3 (L) 08/04/2017   MCV 95.5 08/04/2017   PLT 125 (L) 08/04/2017    Lab Results  Component Value Date   CREATININE 3.16 (H) 08/05/2017   BUN 44 (H) 08/05/2017   NA 138 08/05/2017   K 3.0 (L) 08/05/2017   CL 113 (H) 08/05/2017   CO2 19 (L) 08/05/2017    Lab Results  Component Value Date   ALT 31 08/05/2017   AST 71 (H) 08/05/2017   ALKPHOS 86 08/05/2017   BILITOT 0.7 08/05/2017      Microbiology: No results found for this or any previous visit (from the past 240 hour(s)).  Studies/Results: No results found.  Assessment/Plan:  52 year old male with chronic [name]/ hep C, CKD, CAD who is admitted with MRSA bacteremia omplicated by septic arthritis of the left shoulder s/p I&D 5/22 and acute on chronic kidney disease.  MRSA Bacteremia/ Lt Shoulder Septic Arthritis: Currently on Daptomycin for planned 28 day course, last day June 16th. Follow weekly CK. Can remove central line after completion of MRSA treatment.  Chronic [name]: Off [name] due to renal dysfunction. He is planned to transition to Tenofovir alfanemide (TAF) aka Vemlidy pending insurance prior authorization.   Acute on Chronic CKD: Nephrology following, renal  function improving over weekend.  Chronic Hep C: Treatment deferred pending acute MRSA bacteremia treatment.   Zada Finders, MD Internal Medicine PGY-3 08/06/2017, 10:29 AM

## 2017-08-06 NOTE — Progress Notes (Signed)
PROGRESS NOTE    Glenn Alvarado  ERD:[id number] DOB: [birth year] DOA: 07/22/2017 PCP: Harvie Junior, MD  Brief Narrative: 52 year old male with history of coronary artery disease, hypertension, hyperlipidemia, COPD, depression, anxiety, substance abuse disorder was admitted to [facility] few days ago with acute encephalopathy and questionable suicidal ideation. In the ED at [facility] he was noted to have leukocytosis, renal failure, anion gap metabolic acidosis, elevated bilirubin and AST as well as worsening anemia from his baseline. Urine drug screen was positive for amphetamines and benzodiazepines. Following admission patient improved, his encephalopathy resolved. Due to concern for suicidal ideation psychiatry was consulted and the patient was cleared from psychiatry standpoint. He incidentally was noted to have 1/4 blood cultures positive for MRSA. He has also been complaining of worsening left shoulder pain and underwent the plain imaging as well as a CT scan which was concerning for osteomyelitis of the glenoid humeral joint. Orthopedics were consulted and recommended transfer to a tertiary center and he was moved to [facility] orthopedic surgery and ID were consulted. Underwent an MRI which was positive for septic arthritis and he was taken to the operating room on 5/21    Assessment & Plan:   Principal Problem:   MRSA bacteremia Active Problems:   CAD (coronary artery disease)   Depression with anxiety   Essential hypertension   GERD (gastroesophageal reflux disease)   HLD (hyperlipidemia)   Substance abuse ([place])   AKI (acute kidney injury) ([place])   Pancytopenia ([place])   Acute metabolic encephalopathy   Septic joint of left shoulder region ([place])   Left shoulder pain   Abnormal LFTs   Tobacco abuse   COPD (chronic obstructive pulmonary disease) ([name])   Chronic viral hepatitis B with cirrhosis ([name])   Hepatitis C antibody test positive   1]left  shoulder septic arthritis /MRSA bacteremia-status post irrigation and debridement 07/25/2017. Repeat blood cultures negative. Currently on daptomycin till June 16. Echo showed no evidence of endocarditis. This patient was transferred here from [facility] morphine.tunneled catheter in place for IV antibiotics.  DC sutures today  2]AKIon CKD 3Nephrology following. Renal ultrasound no obstruction. noted tenofevir stopped to see improvement in aki.slowly recovering.  3]history of cirrhosis of the liver/hepatitis B followed at [facility] by infectious disease. He is no longer a candidate for entecavir due to AKI. Process for Medicaid approval for Shirlee Latch is in the process.  4]substance abuse disorder-patient is very upset that the Xanax has been decreased. Continue Celexa.      DVT prophylaxis: SCD Code Status:  full code Family Communication none Disposition Plan DC to SNF-in [place] once ID and nephrology is okay. Consultants: ID, Ortho, nephrology  Procedures: Left shoulder irrigation and debridement 07/25/2017 Antimicrobials: Daptomycin  Subjective: No new complaints walking around the room  Objective: Vitals:   08/05/17 0500 08/05/17 1320 08/05/17 2055 08/06/17 0517  BP:  126/69 125/72 118/64  Pulse:  94 (!) 101 97  Resp:    17  Temp:  98.1 F (36.7 C) 98.1 F (36.7 C) 98.1 F (36.7 C)  TempSrc:  Oral Oral Oral  SpO2:  100% 100% 100%  Weight: 78.5 kg (173 lb 1 oz)   78.2 kg (172 lb 6.4 oz)  Height:        Intake/Output Summary (Last 24 hours) at 08/06/2017 1134 Last data filed at 08/06/2017 0900 Gross per 24 hour  Intake 2235 ml  Output 2650 ml  Net -415 ml   Filed Weights   08/04/17 0504 08/05/17  0500 08/06/17 0517  Weight: 78.4 kg (172 lb 13.5 oz) 78.5 kg (173 lb 1 oz) 78.2 kg (172 lb 6.4 oz)    Examination:  General exam: Appears calm and comfortable  Respiratory system: Clear to auscultation. Respiratory effort  normal. Cardiovascular system: S1 & S2 heard, RRR. No JVD, murmurs, rubs, gallops or clicks. No pedal edema. Gastrointestinal system: Abdomen is nondistended, soft and nontender. No organomegaly or masses felt. Normal bowel sounds heard. Central nervous system: Alert and oriented. No focal neurological deficits. Extremities: Symmetric 5 x 5 power. Skin: No rashes, lesions or ulcers Psychiatry: Judgement and insight appear normal. Mood & affect appropriate.     Data Reviewed: I have personally reviewed following labs and imaging studies  CBC: Recent Labs  Lab 07/31/17 0528 08/01/17 0526 08/02/17 0419 08/03/17 0423 08/04/17 0428  WBC 5.9 5.9 6.1 5.6 5.4  HGB 8.9* 8.3* 7.9* 8.3* 7.8*  HCT 27.1* 25.3* 24.0* 25.3* 23.3*  MCV 97.1 95.8 96.4 95.5 95.5  PLT 156 142* 143* 152 459*   Basic Metabolic Panel: Recent Labs  Lab 08/02/17 0419 08/03/17 0423 08/04/17 0428 08/05/17 0440 08/05/17 1340  NA 136  137 138  137 135 136 138  K 4.4  4.4 4.4  4.4 4.2 4.3 3.0*  CL 101  102 108  108 105 104 113*  CO2 24  25 24  23 23 24  19*  GLUCOSE 99  97 103*  104* 100* 100* 92  BUN 56*  57* 59*  59* 55* 56* 44*  CREATININE 4.08*  4.05* 4.35*  4.30* 4.15* 4.11* 3.16*  CALCIUM 8.7*  8.7* 8.5*  8.5* 8.4* 8.6* 6.8*  PHOS 5.2* 5.2* 5.3* 4.9* 3.8   GFR: Estimated Creatinine Clearance: 27.1 mL/min (A) (by C-G formula based on SCr of 3.16 mg/dL (H)). Liver Function Tests: Recent Labs  Lab 08/01/17 0526 08/02/17 0419 08/03/17 0423 08/04/17 0428 08/05/17 0440 08/05/17 1340  AST 73* 68* 68* 66* 71*  --   ALT 30 29 30 29 31   --   ALKPHOS 85 87 86 81 86  --   BILITOT 0.6 0.6 0.7 0.7 0.7  --   PROT 8.7* 7.9 8.0 7.8 7.9  --   ALBUMIN 2.6*  2.6* 2.5*  2.4* 2.6*  2.6* 2.5* 2.6* 2.2*   No results for input(s): LIPASE, AMYLASE in the last 168 hours. No results for input(s): AMMONIA in the last 168 hours. Coagulation Profile: No results for input(s): INR, PROTIME in the last 168  hours. Cardiac Enzymes: Recent Labs  Lab 08/01/17 0526  CKTOTAL 64   BNP (last 3 results) No results for input(s): PROBNP in the last 8760 hours. HbA1C: No results for input(s): HGBA1C in the last 72 hours. CBG: Recent Labs  Lab 08/02/17 0810 08/03/17 0736 08/04/17 0810 08/05/17 0740 08/06/17 0738  GLUCAP 97 101* 102* 98 90   Lipid Profile: No results for input(s): CHOL, HDL, LDLCALC, TRIG, CHOLHDL, LDLDIRECT in the last 72 hours. Thyroid Function Tests: No results for input(s): TSH, T4TOTAL, FREET4, T3FREE, THYROIDAB in the last 72 hours. Anemia Panel: No results for input(s): VITAMINB12, FOLATE, FERRITIN, TIBC, IRON, RETICCTPCT in the last 72 hours. Sepsis Labs: No results for input(s): PROCALCITON, LATICACIDVEN in the last 168 hours.  No results found for this or any previous visit (from the past 240 hour(s)).       Radiology Studies: No results found.      Scheduled Meds: . ALPRAZolam  0.5 mg Oral Daily  . citalopram  10  mg Oral Daily  . darbepoetin (ARANESP) injection - NON-DIALYSIS  100 mcg Subcutaneous Q Thu-1800  . enoxaparin (LOVENOX) injection  30 mg Subcutaneous Q24H  . feeding supplement (ENSURE ENLIVE)  237 mL Oral BID BM  . lidocaine (PF)  5 mL Other Once  . multivitamin with minerals  1 tablet Oral Daily  . nicotine  21 mg Transdermal Daily   Continuous Infusions: . DAPTOmycin (CUBICIN)  IV Stopped (08/05/17 1103)     LOS: 15 days     Georgette Shell, MD [facility] If 7PM-7AM, please contact night-coverage [email or web address] [facility] 08/06/2017, 11:34 AM

## 2017-08-06 NOTE — Social Work (Addendum)
CSW continues to be unable to find placement for pt to complete IV abx. CSW will continue to discuss with Care Management Medical Director Burnard Bunting, MD. However, pt will likely complete full abx course at hospital due to lack of placement options.   CSW signing off. Please consult if any additional needs arise.  Alexander Mt, [place] Work [phone]

## 2017-08-07 LAB — RENAL FUNCTION PANEL
ANION GAP: 9 (ref 5–15)
Albumin: 2.5 g/dL — ABNORMAL LOW (ref 3.5–5.0)
BUN: 54 mg/dL — AB (ref 6–20)
CO2: 22 mmol/L (ref 22–32)
Calcium: 8.5 mg/dL — ABNORMAL LOW (ref 8.9–10.3)
Chloride: 103 mmol/L (ref 101–111)
Creatinine, Ser: 3.82 mg/dL — ABNORMAL HIGH (ref 0.61–1.24)
GFR calc Af Amer: 20 mL/min — ABNORMAL LOW (ref >60)
GFR calc non Af Amer: 17 mL/min — ABNORMAL LOW (ref >60)
GLUCOSE: 134 mg/dL — AB (ref 65–99)
POTASSIUM: 4.1 mmol/L (ref 3.5–5.1)
Phosphorus: 5 mg/dL — ABNORMAL HIGH (ref 2.5–4.6)
SODIUM: 134 mmol/L — AB (ref 135–145)

## 2017-08-07 LAB — GLUCOSE, CAPILLARY: Glucose-Capillary: 87 mg/dL (ref 65–99)

## 2017-08-07 NOTE — Progress Notes (Signed)
Physical Therapy Treatment Patient Details Name: Glenn Alvarado MRN: [id number] DOB: 1966/03/01 Today's Date: 08/07/2017    History of Present Illness Glenn Alvarado is a 52yo white male who comes to [facility] on 5/22 after 3 weeks shoulder pain, now presenting s/p Left  GHJ I&D. 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     PT Comments    Pt has achieved goal level adequate for d/c from acute PT. Reviewed balance exercises with pt that he can perform in room and instructed him to ambulate on unit 3-5x/ day. No f/u therapy recommended at this point as pt appears to be at his baseline. PT signing off.    Follow Up Recommendations  No PT follow up     Equipment Recommendations  None recommended by PT    Recommendations for Other Services       Precautions / Restrictions Precautions Precautions: None Required Braces or Orthoses: Sling(for comfort per ortho note, no order for sling) Restrictions Weight Bearing Restrictions: No LUE Weight Bearing: Weight bearing as tolerated    Mobility  Bed Mobility Overal bed mobility: Independent                Transfers Overall transfer level: Modified independent Equipment used: None Transfers: Sit to/from Stand Sit to Stand: Modified independent (Device/Increase time)         General transfer comment: pt safe with transfers  Ambulation/Gait Ambulation/Gait assistance: Modified independent (Device/Increase time) Ambulation Distance (Feet): 500 Feet Assistive device: None Gait Pattern/deviations: Step-through pattern;Decreased stride length Gait velocity: 2.73 ft/sec Gait velocity interpretation: >2.62 ft/sec, indicative of community ambulatory General Gait Details: pt with no LOB but shaking increased after first 500'. Pt also with very slow gait and little ability to increase pace. Worked on balance assessment.     Stairs              Wheelchair Mobility    Modified Rankin (Stroke Patients Only)       Balance Overall balance assessment: Mild deficits observed, not formally tested                                          Cognition Arousal/Alertness: Awake/alert Behavior During Therapy: WFL for tasks assessed/performed Overall Cognitive Status: History of cognitive impairments - at baseline                                        Exercises      General Comments General comments (skin integrity, edema, etc.): worked on balance activities in room including picking object from floor, tossing and catching, SL stance, tandem stance and tandem walking, side stepping, bkwd stepping. Pt instructed to keep working on these in room as well as ambulating 3-5x/ day      Pertinent Vitals/Pain Pain Assessment: Faces Faces Pain Scale: Hurts even more Pain Location: L shoulder Pain Descriptors / Indicators: Aching Pain Intervention(s): Monitored during session    Home Living                      Prior Function            PT Goals (current goals can now be found in the care plan section) Acute Rehab PT Goals Patient Stated Goal:  reduce pain return to home to be there for daughters (11, 9, 5) PT Goal Formulation: With patient Time For Goal Achievement: 08/09/17 Potential to Achieve Goals: Good Progress towards PT goals: Goals met/education completed, patient discharged from PT    Frequency    Min 2X/week      PT Plan Discharge plan needs to be updated    Co-evaluation              AM-PAC PT "6 Clicks" Daily Activity  Outcome Measure  Difficulty turning over in bed (including adjusting bedclothes, sheets and blankets)?: None Difficulty moving from lying on back to sitting on the side of the bed? : None Difficulty sitting down on and standing up from a chair with arms (e.g., wheelchair, bedside commode, etc,.)?: None Help needed moving to and from a  bed to chair (including a wheelchair)?: None Help needed walking in hospital room?: None Help needed climbing 3-5 steps with a railing? : A Little 6 Click Score: 23    End of Session   Activity Tolerance: Patient tolerated treatment well Patient left: with call bell/phone within reach Nurse Communication: Mobility status PT Visit Diagnosis: Unsteadiness on feet (R26.81);Other abnormalities of gait and mobility (R26.89);Difficulty in walking, not elsewhere classified (R26.2)     Time: 3235-5732 PT Time Calculation (min) (ACUTE ONLY): 18 min  Charges:  $Gait Training: 8-22 mins                    G Codes:       Leighton Roach, PT  Acute Rehab Services  [place] 08/07/2017, 12:40 PM

## 2017-08-07 NOTE — Progress Notes (Signed)
Patient ID: Glenn Alvarado, male   DOB: [birth year], 52 y.o.   MRN: [id number] [facility] Progress Note   Assessment/ Plan:   1.  Acute kidney injury on chronic kidney disease stage III: Likely from ATN with decreased oral intake/volume depletion with ongoing tenofovir/bacteremia.  He is nonoliguric with creatinine that appears to be plateaued versus improving slowly.  No indications for dialysis at this time. 2.  Left shoulder septic arthritis/chronic infection with MRSA bacteremia: Status post arthroscopy with irrigation and debridement and currently ongoing follow-up by infectious disease.  On intravenous daptomycin with end date of 08/23/2017 via PICC line.  He is considering home intravenous antibiotic infusions. 3.  Anemia: Secondary to malnutrition-inflammatory complex associated with chronic infection/illness as well as iron deficiency.  Will delay intravenous iron therapy for now. 4.  Hypertension: Blood pressure well controlled on monotherapy with nifedipine 5.  Chronic hepatitis B and C infections: Currently on tenofovir for hepatitis B infection with plans for pursuing treatment for hepatitis C later per patient.  (Follows [facility] with [facility]) 6.  History of polysubstance abuse  Subjective:   Problems with disposition noted-unable to find facility down in [place] that can accommodate his need for intravenous antibiotic therapy (or is acceptable to him/family).  Complains of intermittent left shoulder pain.   Objective:   BP 130/78 (BP Location: Right Arm)   Pulse 95   Temp 98.1 F (36.7 C) (Oral)   Resp 19   Ht 5' 7.99" (1.727 m)   Wt 78.8 kg (173 lb 11.6 oz)   SpO2 100%   BMI 26.42 kg/m   Intake/Output Summary (Last 24 hours) at 08/07/2017 0839 Last data filed at 08/07/2017 0521 Gross per 24 hour  Intake 1454 ml  Output 1220 ml  Net 234 ml   Weight change: 0.6 kg (1 lb 5.2 oz)  Physical Exam: Gen: Appears comfortable sitting up on the side of his  bed eating breakfast CVS: Pulse regular rhythm, normal rate, S1 and S2 normal. Resp: Clear to auscultation-no rales/rhonchi.  Right IJ PICC line Abd: Soft, flat, nontender, bowel sounds normal Ext: 1+ lower extremity edema  Imaging: No results found.  Labs: BMET Recent Labs  Lab 08/02/17 0419 08/03/17 0423 08/04/17 0428 08/05/17 0440 08/05/17 1340 08/06/17 1618 08/07/17 0405  NA 136  137 138  137 135 136 138 138 134*  K 4.4  4.4 4.4  4.4 4.2 4.3 3.0* 3.9 4.1  CL 101  102 108  108 105 104 113* 103 103  CO2 24  25 24  23 23 24  19* 26 22  GLUCOSE 99  97 103*  104* 100* 100* 92 118* 134*  BUN 56*  57* 59*  59* 55* 56* 44* 54* 54*  CREATININE 4.08*  4.05* 4.35*  4.30* 4.15* 4.11* 3.16* 3.91* 3.82*  CALCIUM 8.7*  8.7* 8.5*  8.5* 8.4* 8.6* 6.8* 8.5* 8.5*  PHOS 5.2* 5.2* 5.3* 4.9* 3.8 4.9* 5.0*   CBC Recent Labs  Lab 08/01/17 0526 08/02/17 0419 08/03/17 0423 08/04/17 0428  WBC 5.9 6.1 5.6 5.4  HGB 8.3* 7.9* 8.3* 7.8*  HCT 25.3* 24.0* 25.3* 23.3*  MCV 95.8 96.4 95.5 95.5  PLT 142* 143* 152 125*    Medications:    . ALPRAZolam  0.5 mg Oral Daily  . citalopram  10 mg Oral Daily  . darbepoetin (ARANESP) injection - NON-DIALYSIS  100 mcg Subcutaneous Q Thu-1800  . enoxaparin (LOVENOX) injection  30 mg Subcutaneous Q24H  . feeding supplement (ENSURE  ENLIVE)  237 mL Oral BID BM  . lidocaine (PF)  5 mL Other Once  . multivitamin with minerals  1 tablet Oral Daily  . nicotine  21 mg Transdermal Daily  . Tenofovir Alafenamide Fumarate  25 mg Oral q morning - 10a    Elmarie Shiley, MD 08/07/2017, 8:39 AM

## 2017-08-07 NOTE — Progress Notes (Addendum)
PROGRESS NOTE    Glenn Alvarado  LYY:[id number] DOB: [birth year] DOA: 07/22/2017 PCP: Harvie Junior, MD  Brief Narrative:52 year old male with history of coronary artery disease, hypertension, hyperlipidemia, COPD, depression, anxiety, substance abuse disorder was admitted to [facility] few days ago with acute encephalopathy and questionable suicidal ideation. In the ED at [facility] he was noted to have leukocytosis, renal failure, anion gap metabolic acidosis, elevated bilirubin and AST as well as worsening anemia from his baseline. Urine drug screen was positive for amphetamines and benzodiazepines. Following admission patient improved, his encephalopathy resolved. Due to concern for suicidal ideation psychiatry was consulted and the patient was cleared from psychiatry standpoint. He incidentally was noted to have 1/4 blood cultures positive for MRSA. He has also been complaining of worsening left shoulder pain and underwent the plain imaging as well as a CT scan which was concerning for osteomyelitis of the glenoid humeral joint. Orthopedics were consulted and recommended transfer to a tertiary center and he was moved to [facility] orthopedic surgery and ID were consulted. Underwent an MRI which was positive for septic arthritis and he was taken to the operating room on 5/21      Assessment & Plan:   Principal Problem:   MRSA bacteremia Active Problems:   CAD (coronary artery disease)   Depression with anxiety   Essential hypertension   GERD (gastroesophageal reflux disease)   HLD (hyperlipidemia)   Substance abuse ([place])   AKI (acute kidney injury) ([place])   Pancytopenia ([place])   Acute metabolic encephalopathy   Septic joint of left shoulder region ([place])   Left shoulder pain   Abnormal LFTs   Tobacco abuse   COPD (chronic obstructive pulmonary disease) ([name])   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 Repeat blood cultures negative. Currently on daptomycin till June 16. Echo showed no evidence of endocarditis. This patient was transferred here from [facility] morphine.tunneled catheter in place for IV antibiotics.  Follow renal functions liver functions and CBC and CPK.  2]AKIon CKD 3Nephrology following.  Slowly improving. Renal ultrasound no obstruction. noted tenofevir stopped to see improvement in aki.  3]history of cirrhosis of the liver/hepatitis B followed at [facility] by infectious disease. He is no longer a candidate for entecavir due to AKI. Process for Medicaid approval for Shirlee Latch is in the process.  4]substance abuse disorder-patient is very upset that the Xanax has been stopped per psych recommendations.. Continue Celexa.        DVT prophylaxis: SCD Code Status: Full code Family Communication: No family available Disposition Plan: Plan was to discharge him to SNF for IV antibiotics.  Patient has history of IV drug use.  Unable to find placement for patient.  So he most likely will complete his antibiotic course in the hospital. Consultants: Infectious disease, nephrology  Procedures: Left shoulder and debridement 07/25/2017 Antimicrobials: Daptomycin Subjective: No specific complaints.   Objective: Vitals:   08/06/17 0517 08/06/17 1232 08/06/17 2151 08/07/17 0519  BP: 118/64 (!) 133/93 (!) 143/78 130/78  Pulse: 97 93 87 95  Resp: 17 19 18 19   Temp: 98.1 F (36.7 C) 98.1 F (36.7 C) 98.4 F (36.9 C) 98.1 F (36.7 C)  TempSrc: Oral Oral Oral Oral  SpO2: 100% 100% 100% 100%  Weight: 78.2 kg (172 lb 6.4 oz)   78.8 kg (173 lb 11.6 oz)  Height:  Intake/Output Summary (Last 24 hours) at 08/07/2017 0927 Last data filed at 08/07/2017 0521 Gross per 24 hour  Intake 1334 ml  Output 1220 ml  Net 114 ml   Filed Weights    08/05/17 0500 08/06/17 0517 08/07/17 0519  Weight: 78.5 kg (173 lb 1 oz) 78.2 kg (172 lb 6.4 oz) 78.8 kg (173 lb 11.6 oz)    Examination:  General exam: Appears calm and comfortable  Respiratory system: Clear to auscultation. Respiratory effort normal. Cardiovascular system: S1 & S2 heard, RRR. No JVD, murmurs, rubs, gallops or clicks. No pedal edema. Gastrointestinal system: Abdomen is nondistended, soft and nontender. No organomegaly or masses felt. Normal bowel sounds heard. Central nervous system: Alert and oriented. No focal neurological deficits. Extremitiesedema Skin: No rashes, lesions or ulcers Psychiatry: Judgement and insight appear normal. Mood & affect appropriate.     Data Reviewed: I have personally reviewed following labs and imaging studies  CBC: Recent Labs  Lab 08/01/17 0526 08/02/17 0419 08/03/17 0423 08/04/17 0428  WBC 5.9 6.1 5.6 5.4  HGB 8.3* 7.9* 8.3* 7.8*  HCT 25.3* 24.0* 25.3* 23.3*  MCV 95.8 96.4 95.5 95.5  PLT 142* 143* 152 381*   Basic Metabolic Panel: Recent Labs  Lab 08/04/17 0428 08/05/17 0440 08/05/17 1340 08/06/17 1618 08/07/17 0405  NA 135 136 138 138 134*  K 4.2 4.3 3.0* 3.9 4.1  CL 105 104 113* 103 103  CO2 23 24 19* 26 22  GLUCOSE 100* 100* 92 118* 134*  BUN 55* 56* 44* 54* 54*  CREATININE 4.15* 4.11* 3.16* 3.91* 3.82*  CALCIUM 8.4* 8.6* 6.8* 8.5* 8.5*  PHOS 5.3* 4.9* 3.8 4.9* 5.0*   GFR: Estimated Creatinine Clearance: 22.4 mL/min (A) (by C-G formula based on SCr of 3.82 mg/dL (H)). Liver Function Tests: Recent Labs  Lab 08/01/17 0526 08/02/17 0419 08/03/17 0423 08/04/17 0428 08/05/17 0440 08/05/17 1340 08/06/17 1618 08/07/17 0405  AST 73* 68* 68* 66* 71*  --   --   --   ALT 30 29 30 29 31   --   --   --   ALKPHOS 85 87 86 81 86  --   --   --   BILITOT 0.6 0.6 0.7 0.7 0.7  --   --   --   PROT 8.7* 7.9 8.0 7.8 7.9  --   --   --   ALBUMIN 2.6*  2.6* 2.5*  2.4* 2.6*  2.6* 2.5* 2.6* 2.2* 2.5* 2.5*   No results  for input(s): LIPASE, AMYLASE in the last 168 hours. No results for input(s): AMMONIA in the last 168 hours. Coagulation Profile: No results for input(s): INR, PROTIME in the last 168 hours. Cardiac Enzymes: Recent Labs  Lab 08/01/17 0526  CKTOTAL 64   BNP (last 3 results) No results for input(s): PROBNP in the last 8760 hours. HbA1C: No results for input(s): HGBA1C in the last 72 hours. CBG: Recent Labs  Lab 08/03/17 0736 08/04/17 0810 08/05/17 0740 08/06/17 0738 08/07/17 0747  GLUCAP 101* 102* 98 90 87   Lipid Profile: No results for input(s): CHOL, HDL, LDLCALC, TRIG, CHOLHDL, LDLDIRECT in the last 72 hours. Thyroid Function Tests: No results for input(s): TSH, T4TOTAL, FREET4, T3FREE, THYROIDAB in the last 72 hours. Anemia Panel: No results for input(s): VITAMINB12, FOLATE, FERRITIN, TIBC, IRON, RETICCTPCT in the last 72 hours. Sepsis Labs: No results for input(s): PROCALCITON, LATICACIDVEN in the last 168 hours.  No results found for this or any previous visit (from the past 240  hour(s)).       Radiology Studies: No results found.      Scheduled Meds: . ALPRAZolam  0.5 mg Oral Daily  . citalopram  10 mg Oral Daily  . darbepoetin (ARANESP) injection - NON-DIALYSIS  100 mcg Subcutaneous Q Thu-1800  . enoxaparin (LOVENOX) injection  30 mg Subcutaneous Q24H  . feeding supplement (ENSURE ENLIVE)  237 mL Oral BID BM  . lidocaine (PF)  5 mL Other Once  . multivitamin with minerals  1 tablet Oral Daily  . nicotine  21 mg Transdermal Daily  . Tenofovir Alafenamide Fumarate  25 mg Oral q morning - 10a   Continuous Infusions: . DAPTOmycin (CUBICIN)  IV Stopped (08/05/17 1103)     LOS: 16 days     Georgette Shell, MD [facility]  If 7PM-7AM, please contact night-coverage [email or web address] Password [email or web address] 08/07/2017, 9:27 AM

## 2017-08-08 LAB — RENAL FUNCTION PANEL
ANION GAP: 5 (ref 5–15)
Albumin: 2.5 g/dL — ABNORMAL LOW (ref 3.5–5.0)
BUN: 54 mg/dL — ABNORMAL HIGH (ref 6–20)
CO2: 22 mmol/L (ref 22–32)
Calcium: 8.1 mg/dL — ABNORMAL LOW (ref 8.9–10.3)
Chloride: 105 mmol/L (ref 101–111)
Creatinine, Ser: 4.09 mg/dL — ABNORMAL HIGH (ref 0.61–1.24)
GFR calc Af Amer: 18 mL/min — ABNORMAL LOW (ref >60)
GFR calc non Af Amer: 16 mL/min — ABNORMAL LOW (ref >60)
GLUCOSE: 93 mg/dL (ref 65–99)
POTASSIUM: 4.6 mmol/L (ref 3.5–5.1)
Phosphorus: 4.6 mg/dL (ref 2.5–4.6)
Sodium: 132 mmol/L — ABNORMAL LOW (ref 135–145)

## 2017-08-08 LAB — CK: Total CK: 59 U/L (ref 49–397)

## 2017-08-08 LAB — GLUCOSE, CAPILLARY: Glucose-Capillary: 90 mg/dL (ref 65–99)

## 2017-08-08 NOTE — Progress Notes (Signed)
Pharmacy Antibiotic Note  Glenn Alvarado is a 52 y.o. male admitted on 07/22/2017 with left shoulder pain.  Found to have MRSA bacteremia and left shoulder septic arthritis.  Pharmacy has been consulted for Cubicin dosing.  Patient's renal function is worsening again.  Afebrile, WBC WNL.   Plan: Cubicin 645m IV Q48H through 08/19/17 (~8 mg/kg) Monitor renal fxn, CK weekly qWed   Height: 5' 7.99" (172.7 cm) Weight: 173 lb 11.6 oz (78.8 kg) IBW/kg (Calculated) : 68.38  Temp (24hrs), Avg:98.4 F (36.9 C), Min:98.1 F (36.7 C), Max:98.8 F (37.1 C)  Recent Labs  Lab 08/02/17 0419 08/03/17 0423 08/04/17 0428 08/05/17 0440 08/05/17 1340 08/06/17 1618 08/07/17 0405 08/08/17 0519  WBC 6.1 5.6 5.4  --   --   --   --   --   CREATININE 4.08*  4.05* 4.35*  4.30* 4.15* 4.11* 3.16* 3.91* 3.82* 4.09*    Estimated Creatinine Clearance: 20.9 mL/min (A) (by C-G formula based on SCr of 4.09 mg/dL (H)).    Allergies  Allergen Reactions  . Codeine Hives  . Acetaminophen Other (See Comments)    Advised not to take d/t liver disease    Vanc 5/20 >>5/21 Cubicin 5/22 >> (6/16)  5/22 CK = 110  5/29 CK = 64 6/5 CK = 59  BCx OSH: 1/4 MRSA 5/20 BCx - negative 5/20 synovial shoulder fluid: neg 5/22 surg wound cx - negative   Attikus Bartoszek D. DMina Marble PharmD, BCPS, BCCCP Pager:  [phone]/07/2017, 8:14 AM

## 2017-08-08 NOTE — Progress Notes (Signed)
PROGRESS NOTE    Glenn Alvarado  OJJ:[id number] DOB: October 05, 1965 DOA: 07/22/2017 PCP: Harvie Junior, MD      Brief Narrative:  Glenn Alvarado is a 52 y.o. M with CAD, HTN, COPD, and substance use disorder, admitted for encephalopathy, suicidality. Found to have MRSA bacteremia and septic arthritis   "In the ED at [facility] he was noted to have leukocytosis, renal failure, anion gap metabolic acidosis, elevated bilirubin and AST as well as worsening anemia from his baseline. Urine drug screen was positive for amphetamines and benzodiazepines. Following admission patient improved, his encephalopathy resolved. Due to concern for suicidal ideation psychiatry was consulted and the patient was cleared from psychiatry standpoint. He incidentally was noted to have 1/4 blood cultures positive for MRSA. He has also been complaining of worsening left shoulder pain and underwent the plain imaging as well as a CT scan which was concerning for osteomyelitis of the glenoid humeral joint. Orthopedics were consulted and recommended transfer to a tertiary center and he was moved to [facility] orthopedic surgery and ID were consulted. Underwent an MRI which was positive for septic arthritis and he was taken to the operating room on 5/21"   Assessment & Plan:  MRSA bacteremia Left shoulder septic arthritis Transthoracic echocardiogram showed no vegetation.  Status post left shoulder irrigation and debridement 5/22.  Repeat blood cultures negative.  On daptomycin till June 16.  CK normal today.   -Continue daptomycin  Acute kidney injury on CKD Baseline creatinine 1-1.3.  Creatinine 2.88 admission here, has trended up to 4.33.  He is making good urine. -Trend creatinine -Consult nephrology, appreciate recommendations  History of cirrhosis Hepatitis B -Continue tenofovir  Substance use disorder Suicidality Evaluated by Psychiatry during this hospitalization, no furhter suicidal  ideation, cleared by Psych. -Continue SSRI    DVT prophylaxis: Lovenox Code Status: Full code Family Communication: None present MDM and disposition Plan: The below labs and imaging reports were reviewed and summarized above.  The patient's status is clinically unchanged.   He was admitted with MRSA bacteremia, requiring ongoing IV antibiotics.  Consultants:   Infectious disease  Nephrology  Procedures:   Catheter placement  Antimicrobials:   Daptomycin   Subjective: Feels well, shoulder still hurts, no anxiety, no fevers, cough, sputum, new joint pains.  Objective: Vitals:   08/07/17 1426 08/07/17 2148 08/08/17 0618 08/08/17 1500  BP: (!) 141/80 (!) 146/83 135/87 125/78  Pulse: 86 98 84 81  Resp: 18 19 19    Temp: 98.4 F (36.9 C) 98.8 F (37.1 C) 98.1 F (36.7 C) 98.1 F (36.7 C)  TempSrc: Oral Oral Oral Oral  SpO2: 100% 100% 100% 99%  Weight:      Height:        Intake/Output Summary (Last 24 hours) at 08/08/2017 1917 Last data filed at 08/08/2017 1655 Gross per 24 hour  Intake 1600 ml  Output 2150 ml  Net -550 ml   Filed Weights   08/05/17 0500 08/06/17 0517 08/07/17 0519  Weight: 78.5 kg (173 lb 1 oz) 78.2 kg (172 lb 6.4 oz) 78.8 kg (173 lb 11.6 oz)    Examination: General appearance:  adult male, alert and in no acute distress.  Sitting up in bed. HEENT: Anicteric, conjunctiva pink, lids and lashes normal. No nasal deformity, discharge, epistaxis.  Lips moist, partially edentulous, oropharynx moist, no oral lesions.   Skin: Warm and dry.  No jaundice.  No suspicious rashes or lesions. Cardiac: RRR, nl S1-S2, no murmurs appreciated.  capillary refill  is brisk.  JVP normal .  No LE edema.  Radial pulses 2+ and symmetric. Respiratory: Normal respiratory rate and rhythm.  CTAB without rales or wheezes. Abdomen: Abdomen soft.  no TTP. No ascites, distension, hepatosplenomegaly.   MSK: No deformities or effusions in the large joints of the upper lower  extremity's bilaterally, there is tenderness in the left shoulder, without visible changes to the skin, redness, induration, or swelling. Neuro: Awake and alert.  EOMI, moves all extremities. Speech fluent.    Psych: Sensorium intact and responding to questions, attention normal. Affect normal.  Judgment and insight appear normal.    Data Reviewed: I have personally reviewed following labs and imaging studies:  CBC: Recent Labs  Lab 08/02/17 0419 08/03/17 0423 08/04/17 0428  WBC 6.1 5.6 5.4  HGB 7.9* 8.3* 7.8*  HCT 24.0* 25.3* 23.3*  MCV 96.4 95.5 95.5  PLT 143* 152 841*   Basic Metabolic Panel: Recent Labs  Lab 08/05/17 0440 08/05/17 1340 08/06/17 1618 08/07/17 0405 08/08/17 0519  NA 136 138 138 134* 132*  K 4.3 3.0* 3.9 4.1 4.6  CL 104 113* 103 103 105  CO2 24 19* 26 22 22   GLUCOSE 100* 92 118* 134* 93  BUN 56* 44* 54* 54* 54*  CREATININE 4.11* 3.16* 3.91* 3.82* 4.09*  CALCIUM 8.6* 6.8* 8.5* 8.5* 8.1*  PHOS 4.9* 3.8 4.9* 5.0* 4.6   GFR: Estimated Creatinine Clearance: 20.9 mL/min (A) (by C-G formula based on SCr of 4.09 mg/dL (H)). Liver Function Tests: Recent Labs  Lab 08/02/17 0419 08/03/17 0423 08/04/17 0428 08/05/17 0440 08/05/17 1340 08/06/17 1618 08/07/17 0405 08/08/17 0519  AST 68* 68* 66* 71*  --   --   --   --   ALT 29 30 29 31   --   --   --   --   ALKPHOS 87 86 81 86  --   --   --   --   BILITOT 0.6 0.7 0.7 0.7  --   --   --   --   PROT 7.9 8.0 7.8 7.9  --   --   --   --   ALBUMIN 2.5*  2.4* 2.6*  2.6* 2.5* 2.6* 2.2* 2.5* 2.5* 2.5*   No results for input(s): LIPASE, AMYLASE in the last 168 hours. No results for input(s): AMMONIA in the last 168 hours. Coagulation Profile: No results for input(s): INR, PROTIME in the last 168 hours. Cardiac Enzymes: Recent Labs  Lab 08/08/17 0519  CKTOTAL 59   BNP (last 3 results) No results for input(s): PROBNP in the last 8760 hours. HbA1C: No results for input(s): HGBA1C in the last 72  hours. CBG: Recent Labs  Lab 08/04/17 0810 08/05/17 0740 08/06/17 0738 08/07/17 0747 08/08/17 0800  GLUCAP 102* 98 90 87 90   Lipid Profile: No results for input(s): CHOL, HDL, LDLCALC, TRIG, CHOLHDL, LDLDIRECT in the last 72 hours. Thyroid Function Tests: No results for input(s): TSH, T4TOTAL, FREET4, T3FREE, THYROIDAB in the last 72 hours. Anemia Panel: No results for input(s): VITAMINB12, FOLATE, FERRITIN, TIBC, IRON, RETICCTPCT in the last 72 hours. Urine analysis:    Component Value Date/Time   COLORURINE STRAW (A) 07/26/2017 1708   APPEARANCEUR CLEAR 07/26/2017 1708   LABSPEC 1.003 (L) 07/26/2017 1708   PHURINE 5.0 07/26/2017 1708   GLUCOSEU NEGATIVE 07/26/2017 1708   HGBUR LARGE (A) 07/26/2017 1708   BILIRUBINUR NEGATIVE 07/26/2017 [place] 07/26/2017 [name] NEGATIVE 07/26/2017 1708  NITRITE NEGATIVE 07/26/2017 1708   LEUKOCYTESUR NEGATIVE 07/26/2017 1708   Sepsis Labs: @LABRCNTIP (procalcitonin:4,lacticacidven:4)  )No results found for this or any previous visit (from the past 240 hour(s)).       Radiology Studies: No results found.      Scheduled Meds: . ALPRAZolam  0.5 mg Oral Daily  . citalopram  10 mg Oral Daily  . darbepoetin (ARANESP) injection - NON-DIALYSIS  100 mcg Subcutaneous Q Thu-1800  . enoxaparin (LOVENOX) injection  30 mg Subcutaneous Q24H  . feeding supplement (ENSURE ENLIVE)  237 mL Oral BID BM  . lidocaine (PF)  5 mL Other Once  . multivitamin with minerals  1 tablet Oral Daily  . nicotine  21 mg Transdermal Daily  . Tenofovir Alafenamide Fumarate  25 mg Oral q morning - 10a   Continuous Infusions: . DAPTOmycin (CUBICIN)  IV Stopped (08/07/17 0957)     LOS: 17 days    Time spent: 25 minutes    Edwin Dada, MD [facility] 08/08/2017, 7:17 PM     Pager [phone] --- please page though AMION:  [email or web address] Password [email or web address] If 7PM-7AM, please contact night-coverage

## 2017-08-08 NOTE — Progress Notes (Signed)
Patient ID: Glenn Alvarado, male   DOB: 1965-08-25, 52 y.o.   MRN: [id number] [facility] Progress Note   Assessment/ Plan:   1.  Acute kidney injury on chronic kidney disease stage III with likely established new renal baseline at stage IV: Suspected to be from ATN with decreased oral intake/volume depletion with ongoing tenofovir/bacteremia.  He continues to have good urine output with minor fluctuation of creatinine noted.  He does not have any acute electrolyte abnormality, significant volume overload or uremic symptoms/signs to prompt acute intervention.  Scheduled follow-up with me on 09/18/2017 at 9:45 AM with labs 1 week prior to that.  2.  Left shoulder septic arthritis/chronic infection with MRSA bacteremia: Status post arthroscopy with irrigation and debridement and currently ongoing follow-up by infectious disease.  On intravenous daptomycin with end date of 08/23/2017 via PICC line.  He is considering home intravenous antibiotic infusions. 3.  Anemia: Secondary to malnutrition-inflammatory complex associated with chronic infection/illness as well as iron deficiency.  Will delay intravenous iron therapy for now. 4.  Hypertension: Blood pressure well controlled on monotherapy with nifedipine 5.  Chronic hepatitis B and C infections: Currently on tenofovir for hepatitis B infection with plans for pursuing treatment for hepatitis C later per patient.  (Follows up with [facility]) 6.  History of polysubstance abuse  Subjective:   Reports that he was released to independent activity by physical therapy yesterday-MRSA precautions limiting his ambulation.   Objective:   BP 135/87 (BP Location: Right Arm)   Pulse 84   Temp 98.1 F (36.7 C) (Oral)   Resp 19   Ht 5' 7.99" (1.727 m)   Wt 78.8 kg (173 lb 11.6 oz)   SpO2 100%   BMI 26.42 kg/m   Intake/Output Summary (Last 24 hours) at 08/08/2017 0857 Last data filed at 08/08/2017 3536 Gross per 24 hour  Intake 1424.8  ml  Output 2300 ml  Net -875.2 ml   Weight change:   Physical Exam: Gen: Sitting on the edge of his bed eating breakfast CVS: Pulse regular rhythm, normal rate, S1 and S2 normal. Resp: Clear to auscultation-no rales/rhonchi.  Right IJ PICC line Abd: Soft, flat, nontender, bowel sounds normal Ext: Trace lower extremity edema-restricted to the ankle/dorsum of feet  Imaging: No results found.  Labs: BMET Recent Labs  Lab 08/03/17 0423 08/04/17 0428 08/05/17 0440 08/05/17 1340 08/06/17 1618 08/07/17 0405 08/08/17 0519  NA 138  137 135 136 138 138 134* 132*  K 4.4  4.4 4.2 4.3 3.0* 3.9 4.1 4.6  CL 108  108 105 104 113* 103 103 105  CO2 24  23 23 24  19* 26 22 22   GLUCOSE 103*  104* 100* 100* 92 118* 134* 93  BUN 59*  59* 55* 56* 44* 54* 54* 54*  CREATININE 4.35*  4.30* 4.15* 4.11* 3.16* 3.91* 3.82* 4.09*  CALCIUM 8.5*  8.5* 8.4* 8.6* 6.8* 8.5* 8.5* 8.1*  PHOS 5.2* 5.3* 4.9* 3.8 4.9* 5.0* 4.6   CBC Recent Labs  Lab 08/02/17 0419 08/03/17 0423 08/04/17 0428  WBC 6.1 5.6 5.4  HGB 7.9* 8.3* 7.8*  HCT 24.0* 25.3* 23.3*  MCV 96.4 95.5 95.5  PLT 143* 152 125*    Medications:    . ALPRAZolam  0.5 mg Oral Daily  . citalopram  10 mg Oral Daily  . darbepoetin (ARANESP) injection - NON-DIALYSIS  100 mcg Subcutaneous Q Thu-1800  . enoxaparin (LOVENOX) injection  30 mg Subcutaneous Q24H  . feeding supplement (ENSURE ENLIVE)  237 mL Oral BID BM  . lidocaine (PF)  5 mL Other Once  . multivitamin with minerals  1 tablet Oral Daily  . nicotine  21 mg Transdermal Daily  . Tenofovir Alafenamide Fumarate  25 mg Oral q morning - 10a    Elmarie Shiley, MD 08/08/2017, 8:57 AM

## 2017-08-08 NOTE — Progress Notes (Signed)
Nutrition Follow-up  DOCUMENTATION CODES:   Not applicable  INTERVENTION:   -Continue Ensure Enlive po BID, each supplement provides 350 kcal and 20 grams of protein -Continue MVI with minerals daily  NUTRITION DIAGNOSIS:   Increased nutrient needs related to chronic illness(COPD) as evidenced by estimated needs.  Ongoing  GOAL:   Patient will meet greater than or equal to 90% of their needs  Progressing  MONITOR:   Supplement acceptance, Diet advancement, Labs, Weight trends  REASON FOR ASSESSMENT:   Malnutrition Screening Tool    ASSESSMENT:   52 year old male with PMH significant for hypertension, hyperlipidemia, COPD, GERD, depression, anxiety, CAD, substance abuse, and tobacco abuse. Pt presented to [facility] on 07/19/17 with AMS likely due to drug abuse and was transferred to [facility] for evaluation and treatment of left shoulder pain.  5/22- s/p Procedure: Arthroscopic extensive debridement and arthroscopic synovial biopsy 5/29- s/p rt IJ placement  Pt remains with good appetite; meal completion 100%. Pt consuming Ensure shakes.   Pt remains on hospital to complete IV antibiotics; CSW assisting to find SNF placement.   Labs reviewed: Na: 132  Diet Order:   Diet Order           Diet 2 gram sodium Room service appropriate? Yes; Fluid consistency: Thin  Diet effective now          EDUCATION NEEDS:   No education needs have been identified at this time  Skin:  Skin Assessment: Skin Integrity Issues: Skin Integrity Issues:: Incisions Incisions: L shoulder  Last BM:  08/04/17  Height:   Ht Readings from Last 1 Encounters:  07/25/17 5' 7.99" (1.727 m)    Weight:   Wt Readings from Last 1 Encounters:  08/07/17 173 lb 11.6 oz (78.8 kg)    Ideal Body Weight:  70 kg  BMI:  Body mass index is 26.42 kg/m.  Estimated Nutritional Needs:   Kcal:  2100-2300 kcal/day  Protein:  95-110 grams/day  Fluid:  2.1-2.3 L/day    Tavaris Eudy A.  Jimmye Norman, RD, LDN, CDE Pager: [phone] After hours Pager: [phone]

## 2017-08-09 LAB — RENAL FUNCTION PANEL
ANION GAP: 5 (ref 5–15)
Albumin: 2.5 g/dL — ABNORMAL LOW (ref 3.5–5.0)
BUN: 55 mg/dL — ABNORMAL HIGH (ref 6–20)
CALCIUM: 8.3 mg/dL — AB (ref 8.9–10.3)
CHLORIDE: 106 mmol/L (ref 101–111)
CO2: 22 mmol/L (ref 22–32)
Creatinine, Ser: 3.92 mg/dL — ABNORMAL HIGH (ref 0.61–1.24)
GFR calc non Af Amer: 16 mL/min — ABNORMAL LOW (ref >60)
GFR, EST AFRICAN AMERICAN: 19 mL/min — AB (ref >60)
GLUCOSE: 95 mg/dL (ref 65–99)
Phosphorus: 5 mg/dL — ABNORMAL HIGH (ref 2.5–4.6)
Potassium: 4.6 mmol/L (ref 3.5–5.1)
SODIUM: 133 mmol/L — AB (ref 135–145)

## 2017-08-09 LAB — GLUCOSE, CAPILLARY: Glucose-Capillary: 103 mg/dL — ABNORMAL HIGH (ref 65–99)

## 2017-08-09 MED ORDER — CITALOPRAM HYDROBROMIDE 20 MG PO TABS
20.0000 mg | ORAL_TABLET | Freq: Every day | ORAL | Status: DC
  Administered 2017-08-10 – 2017-08-19 (×10): 20 mg via ORAL
  Filled 2017-08-09 (×10): qty 1

## 2017-08-09 MED ORDER — OXYCODONE HCL 5 MG PO TABS
10.0000 mg | ORAL_TABLET | Freq: Four times a day (QID) | ORAL | Status: AC | PRN
  Administered 2017-08-09 – 2017-08-10 (×5): 10 mg via ORAL
  Filled 2017-08-09 (×5): qty 2

## 2017-08-09 MED ORDER — ALPRAZOLAM 0.25 MG PO TABS
0.2500 mg | ORAL_TABLET | Freq: Every day | ORAL | Status: DC
  Administered 2017-08-10 – 2017-08-12 (×3): 0.25 mg via ORAL
  Filled 2017-08-09 (×3): qty 1

## 2017-08-09 NOTE — Progress Notes (Signed)
Patient ID: Glenn Alvarado, male   DOB: 02-Feb-1966, 52 y.o.   MRN: [id number] [facility] Progress Note   Assessment/ Plan:   1.  Acute kidney injury on chronic kidney disease stage III with likely established new renal baseline at stage IV: Suspected to be from ATN with decreased oral intake/volume depletion with ongoing tenofovir/bacteremia-indeed may have established a new renal baseline with a creatinine around 4 (with the distinct possibility that he might have slow/delayed renal recovery down the road).  He continues to maintain good urine output, electrolytes within acceptable range and does not have any uremic signs or symptoms to prompt acute intervention.  He is scheduled follow-up with me on 09/18/2017.  2.  Left shoulder septic arthritis/chronic infection with MRSA bacteremia: Status post arthroscopy with irrigation and debridement and currently ongoing follow-up by infectious disease.  On intravenous daptomycin with end date of 08/23/2017 via PICC line.  Barriers to disposition/placement noted. 3.  Anemia: Secondary to malnutrition-inflammatory complex associated with chronic infection/illness as well as iron deficiency.  Chronic infection likely contributing to his significantly elevated ferritin- will delay intravenous iron. 4.  Hypertension: Blood pressure well controlled on monotherapy with nifedipine 5.  Chronic hepatitis B and C infections: Currently on tenofovir for hepatitis B infection with plans for pursuing treatment for hepatitis C later per patient.  (Follows up with [facility]) 6.  History of polysubstance abuse  Will sign off at this time-please call with questions or concerns  Subjective:   Complains of intermittent shoulder pain and feeling frustrated with prolonged hospitalization.   Objective:   BP (!) 148/90 (BP Location: Left Arm)   Pulse 85   Temp 98.2 F (36.8 C) (Oral)   Resp 18   Ht 5' 7.99" (1.727 m)   Wt 78.3 kg (172 lb 9.9 oz)    SpO2 100%   BMI 26.25 kg/m   Intake/Output Summary (Last 24 hours) at 08/09/2017 0826 Last data filed at 08/09/2017 0800 Gross per 24 hour  Intake 1710 ml  Output 1650 ml  Net 60 ml   Weight change:   Physical Exam: Gen: Sitting on the edge of his bed eating breakfast CVS: Pulse regular rhythm, normal rate, S1 and S2 normal. Resp: Clear to auscultation-no rales/rhonchi.  Right IJ PICC line Abd: Soft, flat, nontender, bowel sounds normal Ext: Trace lower extremity edema-restricted to the ankle/dorsum of feet  Imaging: No results found.  Labs: BMET Recent Labs  Lab 08/04/17 0428 08/05/17 0440 08/05/17 1340 08/06/17 1618 08/07/17 0405 08/08/17 0519 08/09/17 0355  NA 135 136 138 138 134* 132* 133*  K 4.2 4.3 3.0* 3.9 4.1 4.6 4.6  CL 105 104 113* 103 103 105 106  CO2 23 24 19* 26 22 22 22   GLUCOSE 100* 100* 92 118* 134* 93 95  BUN 55* 56* 44* 54* 54* 54* 55*  CREATININE 4.15* 4.11* 3.16* 3.91* 3.82* 4.09* 3.92*  CALCIUM 8.4* 8.6* 6.8* 8.5* 8.5* 8.1* 8.3*  PHOS 5.3* 4.9* 3.8 4.9* 5.0* 4.6 5.0*   CBC Recent Labs  Lab 08/03/17 0423 08/04/17 0428  WBC 5.6 5.4  HGB 8.3* 7.8*  HCT 25.3* 23.3*  MCV 95.5 95.5  PLT 152 125*    Medications:    . ALPRAZolam  0.5 mg Oral Daily  . citalopram  10 mg Oral Daily  . darbepoetin (ARANESP) injection - NON-DIALYSIS  100 mcg Subcutaneous Q Thu-1800  . enoxaparin (LOVENOX) injection  30 mg Subcutaneous Q24H  . feeding supplement (ENSURE ENLIVE)  237  mL Oral BID BM  . lidocaine (PF)  5 mL Other Once  . multivitamin with minerals  1 tablet Oral Daily  . nicotine  21 mg Transdermal Daily  . Tenofovir Alafenamide Fumarate  25 mg Oral q morning - 10a    Elmarie Shiley, MD 08/09/2017, 8:26 AM

## 2017-08-09 NOTE — Progress Notes (Signed)
PROGRESS NOTE    Glenn Alvarado  MVH:[id number] DOB: Dec 17, 1965 DOA: 07/22/2017 PCP: Harvie Junior, MD      Brief Narrative:  Glenn Alvarado is a 52 y.o. M with CAD, HTN, COPD, and substance use disorder, admitted for encephalopathy, suicidality. Found to have MRSA bacteremia and septic arthritis   "In the ED at [facility] he was noted to have leukocytosis, renal failure, anion gap metabolic acidosis, elevated bilirubin and AST as well as worsening anemia from his baseline. Urine drug screen was positive for amphetamines and benzodiazepines. Following admission patient improved, his encephalopathy resolved. Due to concern for suicidal ideation psychiatry was consulted and the patient was cleared from psychiatry standpoint. He incidentally was noted to have 1/4 blood cultures positive for MRSA. He has also been complaining of worsening left shoulder pain and underwent the plain imaging as well as a CT scan which was concerning for osteomyelitis of the glenoid humeral joint. Orthopedics were consulted and recommended transfer to a tertiary center and he was moved to [facility] orthopedic surgery and ID were consulted. Underwent an MRI which was positive for septic arthritis and he was taken to the operating room on 5/21"   Assessment & Plan:  MRSA bacteremia Left shoulder septic arthritis Transthoracic echocardiogram showed no vegetation.  Status post left shoulder irrigation and debridement 5/22.  Repeat blood cultures negative.  On daptomycin till June 16.  CK normal this week.   -Continue dapto -Weekly CK level  Acute kidney injury on CKD Baseline creatinine 1-1.3.  Creatinine 2.88 admission here, has trended up to 4.33.  A little better today. He is making good urine. -Trend creatinine every other day -Appreciate nephrology input  History of cirrhosis Hepatitis B -Continue tenofovir  Substance use disorder Suicidality Evaluated by Psychiatry during this  hospitalization, no furhter suicidal ideation, cleared by Psych. -Continue SSRI -Taper Xanax to 0.25 mg at time of next dose (Psych recommended faster taper before, was not done).  Review of [place] controlled substances registry shows patient has had rare sporadic xanax and oxycodone prescriptions, none more than 7 days.  He denies illicit drug use other than THC.  However, admission H&P here states he was initially admitted encephalopathic with UDS positive for benzodiazepines, amphetamine.  For this reason, the risk of discharge with PICC in place seems to outweigh benefit of discharge for OPAT.  We will maintain in the hospital until IV antibiotics complete Jun 16 and then pull PICC and discharge home.    In the meantime, he is >2 weeks post-op from shoulder debridement.  Still with significant pain in shoulder.  -I will order PT -If stretching and strengthening exercises with PT do not begin to improve his pain in the next 2-3 days, will reimage -In the meantime, I will start to taper oxycodone, so that he will be discharged on Tylenol only in 10 days  Of note, he reports his pain is primarily from headache, from his Lovenox injection sites, and only lastly from his shoulder.      DVT prophylaxis: SCDs Code Status: Full code Family Communication: None present MDM and disposition Plan: The below labs and imaging reports were reviewed and summarized above.  The patient status is clinically unchanged.  He was admitted with MRSA bacteremia, found to have septic left shoulder joint.  This is now status post washout, and he requires 10 more days IV antibiotics.  Due to his IV drug use, we will complete his antibiotics in a monitored setting, before discharge home.  Consultants:   Infectious disease  Nephrology  Procedures:   Catheter placement  Antimicrobials:   Daptomycin   Subjective: Headache today.  Pain in abdomen, at injection sites.  Shoulder still stiff, painful.  No  fever, cough, sputum.  No new joint pains.  Objective: Vitals:   08/08/17 1500 08/08/17 2135 08/09/17 0547 08/09/17 1445  BP: 125/78 135/82 (!) 148/90 140/90  Pulse: 81 86 85 79  Resp:  18 18   Temp: 98.1 F (36.7 C) 98.8 F (37.1 C) 98.2 F (36.8 C) 97.9 F (36.6 C)  TempSrc: Oral Oral Oral Oral  SpO2: 99% 100% 100% 100%  Weight:   78.3 kg (172 lb 9.9 oz)   Height:        Intake/Output Summary (Last 24 hours) at 08/09/2017 1807 Last data filed at 08/09/2017 1445 Gross per 24 hour  Intake 1520 ml  Output 1000 ml  Net 520 ml   Filed Weights   08/06/17 0517 08/07/17 0519 08/09/17 0547  Weight: 78.2 kg (172 lb 6.4 oz) 78.8 kg (173 lb 11.6 oz) 78.3 kg (172 lb 9.9 oz)    Examination: General appearance: Male, sitting in bed, appears in some degree of pain, interactive. HEENT: Anicteric, conjunctival pink, lids and lashes normal.  No nasal deformity, discharge, epistaxis.  Lips moist, partially edentulous, oropharynx moist, no oral lesions.   Skin: Skin warm and dry, incisions on left shoulder appear clean dry and intact. Cardiac: Regular rate and rhythm, no murmurs appreciated, no lower extremity edema. Respiratory: Lungs clear to auscultation, normal respiratory rate. Abdomen: Abdomen tender diffusely, no scars, ecchymosis, swelling.     MSK: No deformities or effusions in the large joints of the upper or lower extremities bilaterally.  Left shoulder still tenderness without visual skin changes.  He has pain with active range of motion of the left shoulder of abduction more than 45 degrees or flexion more than 45 degrees, but passively he is able to extend another 10 degrees both directions. Neuro: Cranial nerves normal, strength normal in right arm, bilateral legs, gait normal, speech fluent.    Psych: Sensorium intact and responding to questions, attention normal, affect blunted, judgment insight appeared poor.    Data Reviewed: I have personally reviewed following labs and  imaging studies:  CBC: Recent Labs  Lab 08/03/17 0423 08/04/17 0428  WBC 5.6 5.4  HGB 8.3* 7.8*  HCT 25.3* 23.3*  MCV 95.5 95.5  PLT 152 314*   Basic Metabolic Panel: Recent Labs  Lab 08/05/17 1340 08/06/17 1618 08/07/17 0405 08/08/17 0519 08/09/17 0355  NA 138 138 134* 132* 133*  K 3.0* 3.9 4.1 4.6 4.6  CL 113* 103 103 105 106  CO2 19* 26 22 22 22   GLUCOSE 92 118* 134* 93 95  BUN 44* 54* 54* 54* 55*  CREATININE 3.16* 3.91* 3.82* 4.09* 3.92*  CALCIUM 6.8* 8.5* 8.5* 8.1* 8.3*  PHOS 3.8 4.9* 5.0* 4.6 5.0*   GFR: Estimated Creatinine Clearance: 21.8 mL/min (A) (by C-G formula based on SCr of 3.92 mg/dL (H)). Liver Function Tests: Recent Labs  Lab 08/03/17 0423 08/04/17 0428 08/05/17 0440 08/05/17 1340 08/06/17 1618 08/07/17 0405 08/08/17 0519 08/09/17 0355  AST 68* 66* 71*  --   --   --   --   --   ALT 30 29 31   --   --   --   --   --   ALKPHOS 86 81 86  --   --   --   --   --  BILITOT 0.7 0.7 0.7  --   --   --   --   --   PROT 8.0 7.8 7.9  --   --   --   --   --   ALBUMIN 2.6*  2.6* 2.5* 2.6* 2.2* 2.5* 2.5* 2.5* 2.5*   No results for input(s): LIPASE, AMYLASE in the last 168 hours. No results for input(s): AMMONIA in the last 168 hours. Coagulation Profile: No results for input(s): INR, PROTIME in the last 168 hours. Cardiac Enzymes: Recent Labs  Lab 08/08/17 0519  CKTOTAL 59   BNP (last 3 results) No results for input(s): PROBNP in the last 8760 hours. HbA1C: No results for input(s): HGBA1C in the last 72 hours. CBG: Recent Labs  Lab 08/05/17 0740 08/06/17 0738 08/07/17 0747 08/08/17 0800 08/09/17 0743  GLUCAP 98 90 87 90 103*   Lipid Profile: No results for input(s): CHOL, HDL, LDLCALC, TRIG, CHOLHDL, LDLDIRECT in the last 72 hours. Thyroid Function Tests: No results for input(s): TSH, T4TOTAL, FREET4, T3FREE, THYROIDAB in the last 72 hours. Anemia Panel: No results for input(s): VITAMINB12, FOLATE, FERRITIN, TIBC, IRON, RETICCTPCT in  the last 72 hours. Urine analysis:    Component Value Date/Time   COLORURINE STRAW (A) 07/26/2017 1708   APPEARANCEUR CLEAR 07/26/2017 1708   LABSPEC 1.003 (L) 07/26/2017 1708   PHURINE 5.0 07/26/2017 1708   GLUCOSEU NEGATIVE 07/26/2017 1708   HGBUR LARGE (A) 07/26/2017 1708   BILIRUBINUR NEGATIVE 07/26/2017 1708   KETONESUR NEGATIVE 07/26/2017 1708   PROTEINUR NEGATIVE 07/26/2017 1708   NITRITE NEGATIVE 07/26/2017 1708   LEUKOCYTESUR NEGATIVE 07/26/2017 1708   Sepsis Labs: @LABRCNTIP (procalcitonin:4,lacticacidven:4)  )No results found for this or any previous visit (from the past 240 hour(s)).       Radiology Studies: No results found.      Scheduled Meds: . ALPRAZolam  0.5 mg Oral Daily  . citalopram  10 mg Oral Daily  . darbepoetin (ARANESP) injection - NON-DIALYSIS  100 mcg Subcutaneous Q Thu-1800  . feeding supplement (ENSURE ENLIVE)  237 mL Oral BID BM  . lidocaine (PF)  5 mL Other Once  . multivitamin with minerals  1 tablet Oral Daily  . nicotine  21 mg Transdermal Daily  . Tenofovir Alafenamide Fumarate  25 mg Oral q morning - 10a   Continuous Infusions: . DAPTOmycin (CUBICIN)  IV Stopped (08/09/17 1100)     LOS: 18 days    Time spent: 25 minutes    Edwin Dada, MD [facility] 08/09/2017, 6:07 PM     Pager [phone] --- please page though AMION:  [email or web address] If 7PM-7AM, please contact night-coverage

## 2017-08-09 NOTE — Evaluation (Signed)
Occupational Therapy Evaluation Patient Details Name: Glenn Alvarado MRN: [id number] DOB: [birth year] Today's Date: 08/09/2017    History of Present Illness Glenn Alvarado is a 52yo white male who comes to [facility] on 5/22 after 3 weeks shoulder pain, now presenting s/p Left  GHJ I&D. 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    Clinical Impression   Pt is independent, but demonstrates L shoulder pain, limited ROM and strength deficits. Educated in [place] exercises for pt to perform on his own. Will follow.     Follow Up Recommendations  No OT follow up    Equipment Recommendations  None recommended by OT    Recommendations for Other Services       Precautions / Restrictions Precautions Precautions: None Required Braces or Orthoses: Sling(for comfort) Restrictions Weight Bearing Restrictions: No LUE Weight Bearing: Weight bearing as tolerated      Mobility Bed Mobility Overal bed mobility: Independent                Transfers Overall transfer level: Independent Equipment used: None                  Balance                                           ADL either performed or assessed with clinical judgement   ADL Overall ADL's : Independent                                             Vision Patient Visual Report: No change from baseline       Perception     Praxis      Pertinent Vitals/Pain Pain Assessment: Faces Faces Pain Scale: Hurts little more Pain Location: L shoulder Pain Descriptors / Indicators: Sore Pain Intervention(s): Monitored during session     Hand Dominance Right   Extremity/Trunk Assessment Upper Extremity Assessment Upper Extremity Assessment: LUE deficits/detail LUE Deficits / Details: FF and abd to 90 degrees actively, able to passively achieve full ROM with pain at end range with abd LUE Coordination: decreased  gross motor   Lower Extremity Assessment Lower Extremity Assessment: Overall WFL for tasks assessed   Cervical / Trunk Assessment Cervical / Trunk Assessment: Normal   Communication Communication Communication: No difficulties   Cognition Arousal/Alertness: Awake/alert Behavior During Therapy: WFL for tasks assessed/performed Overall Cognitive Status: History of cognitive impairments - at baseline                                     General Comments       Exercises Exercises: Other exercises Other Exercises Other Exercises: AAROM L shoulder x 10 all areas, instructed pt to perform x 3 per day and use L UE in ADL   Shoulder Instructions      Home Living Family/patient expects to be discharged to:: Private residence Living Arrangements: Other relatives(brother Glenn Alvarado) Available Help at Discharge: Family;Available PRN/intermittently                         Home Equipment: None   Additional Comments: hx of falls in  shower      Prior Functioning/Environment Level of Independence: Independent                 OT Problem List: Decreased coordination;Pain;Impaired UE functional use      OT Treatment/Interventions: Therapeutic exercise    OT Goals(Current goals can be found in the care plan section) Acute Rehab OT Goals Patient Stated Goal: go home with brother OT Goal Formulation: With patient Time For Goal Achievement: 08/23/17 Potential to Achieve Goals: Good ADL Goals Pt/caregiver will Perform Home Exercise Program: Left upper extremity;Independently(shoulder A/AAROM until pt achieves full range)  OT Frequency: Min 3X/week   Barriers to D/C:            Co-evaluation              AM-PAC PT "6 Clicks" Daily Activity     Outcome Measure Help from another person eating meals?: None Help from another person taking care of personal grooming?: None Help from another person toileting, which includes using toliet, bedpan, or  urinal?: None Help from another person bathing (including washing, rinsing, drying)?: None Help from another person to put on and taking off regular upper body clothing?: None Help from another person to put on and taking off regular lower body clothing?: None 6 Click Score: 24   End of Session    Activity Tolerance: Patient tolerated treatment well Patient left: in chair;with call bell/phone within reach  OT Visit Diagnosis: Pain Pain - Right/Left: Left Pain - part of body: Shoulder                Time: 7829-5621 OT Time Calculation (min): 18 min Charges:  OT General Charges $OT Visit: 1 Visit OT Evaluation $OT Eval Low Complexity: 1 Low G-Codes:     08/17/2017 Glenn Alvarado, OTR/L Pager: [phone]  Glenn Alvarado 08/17/17, 8:44 AM

## 2017-08-10 LAB — RENAL FUNCTION PANEL
ALBUMIN: 2.5 g/dL — AB (ref 3.5–5.0)
Anion gap: 9 (ref 5–15)
BUN: 58 mg/dL — AB (ref 6–20)
CO2: 23 mmol/L (ref 22–32)
CREATININE: 3.96 mg/dL — AB (ref 0.61–1.24)
Calcium: 8.6 mg/dL — ABNORMAL LOW (ref 8.9–10.3)
Chloride: 103 mmol/L (ref 101–111)
GFR calc Af Amer: 19 mL/min — ABNORMAL LOW (ref >60)
GFR calc non Af Amer: 16 mL/min — ABNORMAL LOW (ref >60)
GLUCOSE: 102 mg/dL — AB (ref 65–99)
PHOSPHORUS: 5 mg/dL — AB (ref 2.5–4.6)
POTASSIUM: 4.6 mmol/L (ref 3.5–5.1)
Sodium: 135 mmol/L (ref 135–145)

## 2017-08-10 LAB — CBC
HEMATOCRIT: 23.4 % — AB (ref 39.0–52.0)
Hemoglobin: 7.7 g/dL — ABNORMAL LOW (ref 13.0–17.0)
MCH: 30.1 pg (ref 26.0–34.0)
MCHC: 32.9 g/dL (ref 30.0–36.0)
MCV: 91.4 fL (ref 78.0–100.0)
PLATELETS: 149 10*3/uL — AB (ref 150–400)
RBC: 2.56 MIL/uL — ABNORMAL LOW (ref 4.22–5.81)
RDW: 14.6 % (ref 11.5–15.5)
WBC: 6 10*3/uL (ref 4.0–10.5)

## 2017-08-10 LAB — GLUCOSE, CAPILLARY: GLUCOSE-CAPILLARY: 102 mg/dL — AB (ref 65–99)

## 2017-08-10 MED ORDER — OXYCODONE HCL 5 MG PO TABS
5.0000 mg | ORAL_TABLET | Freq: Four times a day (QID) | ORAL | Status: DC | PRN
  Administered 2017-08-11: 5 mg via ORAL

## 2017-08-10 MED ORDER — OXYCODONE HCL 5 MG PO TABS
5.0000 mg | ORAL_TABLET | Freq: Three times a day (TID) | ORAL | Status: DC | PRN
  Administered 2017-08-11: 5 mg via ORAL
  Filled 2017-08-10 (×2): qty 1

## 2017-08-10 NOTE — Progress Notes (Signed)
PT Cancellation Note  Patient Details Name: Glenn Alvarado MRN: [id number] DOB: 1965/08/03   Cancelled Treatment:    Reason Eval/Treat Not Completed: PT screened, no needs identified, will sign off.  New PT consult received, chart reviewed. OT continuing to follow for shoulder rehabilitation. Pt appears to have met acute PT goals on 08/07/17 and is independent with mobility at this time. Will sign off. If pt has a decline and therapy needs change, please reconsult.    Thelma Comp 08/10/2017, 7:07 AM   Rolinda Roan, PT, DPT Acute Rehabilitation Services Pager: [phone]

## 2017-08-10 NOTE — Progress Notes (Signed)
Occupational Therapy Treatment Patient Details Name: Glenn Alvarado MRN: [id number] DOB: 1965/11/13 Today's Date: 08/10/2017    History of present illness Glenn Alvarado is a 52yo white male who comes to [facility] on 5/22 after 3 weeks shoulder pain, now presenting s/p Left  GHJ I&D. 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    OT comments  Focus of session on R shoulder in supine this visit. Tolerated FF 100 degrees, abd to 90 degrees, ER to 45 degrees, full horizontal add  AAROM. Pt performing self ROM in supine.  Worked within pt's pain tolerance. Pt to continue self ROM 3 x per day.  Follow Up Recommendations  No OT follow up    Equipment Recommendations  None recommended by OT    Recommendations for Other Services      Precautions / Restrictions Precautions Precautions: None Required Braces or Orthoses: Sling(for comfort) Restrictions Weight Bearing Restrictions: No LUE Weight Bearing: Weight bearing as tolerated       Mobility Bed Mobility                  Transfers                      Balance                                           ADL either performed or assessed with clinical judgement   ADL                                               Vision       Perception     Praxis      Cognition Arousal/Alertness: Awake/alert Behavior During Therapy: WFL for tasks assessed/performed Overall Cognitive Status: History of cognitive impairments - at baseline                                          Exercises Exercises: General Upper Extremity General Exercises - Upper Extremity Shoulder Flexion: AAROM;Self ROM;Left;10 reps;Supine Shoulder ADduction: AAROM;Left;10 reps;Supine Shoulder Horizontal ADduction: AAROM;Right;10 reps;Supine Other Exercises Other Exercises: ER R shoulder x10   Shoulder Instructions        General Comments      Pertinent Vitals/ Pain       Pain Assessment: Faces Faces Pain Scale: Hurts little more Pain Location: L shoulder Pain Descriptors / Indicators: Sore;Grimacing Pain Intervention(s): Monitored during session;Repositioned  Home Living                                          Prior Functioning/Environment              Frequency  Min 3X/week        Progress Toward Goals  OT Goals(current goals can now be found in the care plan section)  Progress towards OT goals: Progressing toward goals  Acute Rehab OT Goals Patient Stated Goal: go home with brother OT Goal Formulation: With patient Time For Goal Achievement: 08/23/17 Potential to  Achieve Goals: Good  Plan Discharge plan remains appropriate    Co-evaluation                 AM-PAC PT "6 Clicks" Daily Activity     Outcome Measure   Help from another person eating meals?: None Help from another person taking care of personal grooming?: None Help from another person toileting, which includes using toliet, bedpan, or urinal?: None Help from another person bathing (including washing, rinsing, drying)?: None Help from another person to put on and taking off regular upper body clothing?: None Help from another person to put on and taking off regular lower body clothing?: None 6 Click Score: 24    End of Session    OT Visit Diagnosis: Pain;Other symptoms and signs involving cognitive function Pain - part of body: Shoulder   Activity Tolerance Patient tolerated treatment well   Patient Left in bed;with call bell/phone within reach   Nurse Communication          Time: 6861-6837 OT Time Calculation (min): 24 min  Charges: OT General Charges $OT Visit: 1 Visit OT Treatments $Therapeutic Exercise: 23-37 mins  08/10/2017 Glenn Alvarado, OTR/L Pager: [phone]    Glenn Alvarado, Glenn Alvarado 08/10/2017, 3:00 PM

## 2017-08-10 NOTE — Progress Notes (Signed)
PROGRESS NOTE    Glenn Alvarado  AST:[id number] DOB: January 17, 1966 DOA: 07/22/2017 PCP: Harvie Junior, MD      Brief Narrative:  Mr. Flury is a 52 y.o. M with CAD, HTN, COPD, and substance use disorder, admitted for encephalopathy, suicidality. Found to have MRSA bacteremia and septic arthritis   "In the ED at [facility] he was noted to have leukocytosis, renal failure, anion gap metabolic acidosis, elevated bilirubin and AST as well as worsening anemia from his baseline. Urine drug screen was positive for amphetamines and benzodiazepines. Following admission patient improved, his encephalopathy resolved. Due to concern for suicidal ideation psychiatry was consulted and the patient was cleared from psychiatry standpoint. He incidentally was noted to have 1/4 blood cultures positive for MRSA. He has also been complaining of worsening left shoulder pain and underwent the plain imaging as well as a CT scan which was concerning for osteomyelitis of the glenoid humeral joint. Orthopedics were consulted and recommended transfer to a tertiary center and he was moved to [facility] orthopedic surgery and ID were consulted. Underwent an MRI which was positive for septic arthritis and he was taken to the operating room on 5/21"   Assessment & Plan:  MRSA bacteremia Left shoulder septic arthritis Transthoracic echocardiogram showed no vegetation.  Status post left shoulder irrigation and debridement 5/22.  Repeat blood cultures negative.  On daptomycin till June 16.  CK normal this week.   -Continue daptomycin -Weekly CK level  Acute kidney injury on CKD Baseline creatinine 1-1.3.  Creatinine 2.88 admission here, has trended up to 4.33.  A little better today. He is making good urine. -Trend creatinine three times a week -Appreciate nephrology input  History of cirrhosis Hepatitis B -Continue tenofovir  Substance use disorder Suicidality Evaluated by Psychiatry during this  hospitalization, no furhter suicidal ideation, cleared by Psych. -Continue SSRI -Taper Xanax to 0.25 mg at time of next dose (Psych recommended faster taper before, was not done).  Review of [place] controlled substances registry shows patient has had rare sporadic xanax and oxycodone prescriptions, none more than 7 days.  He denies illicit drug use other than THC.  However, admission H&P here states he was initially admitted encephalopathic with UDS positive for benzodiazepines, amphetamine.  For this reason, the risk of discharge with PICC in place seems to outweigh benefit of discharge for OPAT.  We will maintain in the hospital until IV antibiotics complete Jun 16 and then pull PICC and discharge home.    In the meantime, he is >2 weeks post-op from shoulder debridement.  Still with significant pain in shoulder. OT evaluated him, report he was able to participate well in ROM exercises.   -Will start to taper oxycodone -In the meantime, I will start to taper oxycodone  10 mg TID and 5 mg once daily     DVT prophylaxis: SCDs Code Status: Full code Family Communication: None present MDM and disposition Plan: Below labs and imaging reports were reviewed and summarized above.  The patient's status is clinically unchanged.  He was admitted with MRSA bacteremia, found to have septic left shoulder joint.  This is now status post washout, and he requires 10 more days IV antibiotics.  Due to his IV drug use, we will complete his antibiotics in a monitored setting, before discharge home.      Consultants:   Infectious disease  Nephrology  Procedures:   Catheter placement  Antimicrobials:   Daptomycin   Subjective: No complaints today.  Ambulating from  the bathroom.  Shoulder little bit better.  No new redness, swelling, joint pains, fever.  Objective: Vitals:   08/09/17 1445 08/09/17 2254 08/10/17 0508 08/10/17 1300  BP: 140/90 135/87 (!) 141/87 135/84  Pulse: 79 80 73 84  Resp:   17 17 14   Temp: 97.9 F (36.6 C) 98.6 F (37 C) 98.1 F (36.7 C) 98.2 F (36.8 C)  TempSrc: Oral Oral Oral Oral  SpO2: 100% 99% 99% 97%  Weight:   78.2 kg (172 lb 6.4 oz)   Height:        Intake/Output Summary (Last 24 hours) at 08/10/2017 1524 Last data filed at 08/10/2017 1317 Gross per 24 hour  Intake 1969.4 ml  Output 600 ml  Net 1369.4 ml   Filed Weights   08/07/17 0519 08/09/17 0547 08/10/17 0508  Weight: 78.8 kg (173 lb 11.6 oz) 78.3 kg (172 lb 9.9 oz) 78.2 kg (172 lb 6.4 oz)    Examination: General appearance: Thin adult male, and relating back to the bathroom, interactive, no acute distress. HEENT: Anicteric, conjunctival pink, lids and lashes normal.  No nasal deformity, discharge, or epistaxis.  Lips moist, edentulous, oropharynx moist, no oral lesions. Skin: Skin warm and dry, incision of the left shoulder appear well-healed. Cardiac: RRR, no murmurs, no lower extremity edema. Respiratory: Low to auscultation, respiratory rate normal. Abdomen: Abdomen tender diffusely, no scars, ecchymosis, swelling.     MSK: No deformities or effusions in the large joints of the upper or lower extremities bilaterally.  Left shoulder still tenderness without visual skin changes.  He has pain with active range of motion of the left shoulder of abduction more than 45 degrees or flexion more than 45 degrees, but passively he is able to extend another 10 degrees both directions. Neuro: Cranial nerves are normal, strength normal in the right arm, bilateral legs, gait normal, speech fluent.    Psych: Sensorium intact and responding to questions, intention normal, affect blunted, judgment and insight appear poor.    Data Reviewed: I have personally reviewed following labs and imaging studies:  CBC: Recent Labs  Lab 08/04/17 0428 08/10/17 0412  WBC 5.4 6.0  HGB 7.8* 7.7*  HCT 23.3* 23.4*  MCV 95.5 91.4  PLT 125* 440*   Basic Metabolic Panel: Recent Labs  Lab 08/06/17 1618  08/07/17 0405 08/08/17 0519 08/09/17 0355 08/10/17 0412  NA 138 134* 132* 133* 135  K 3.9 4.1 4.6 4.6 4.6  CL 103 103 105 106 103  CO2 26 22 22 22 23   GLUCOSE 118* 134* 93 95 102*  BUN 54* 54* 54* 55* 58*  CREATININE 3.91* 3.82* 4.09* 3.92* 3.96*  CALCIUM 8.5* 8.5* 8.1* 8.3* 8.6*  PHOS 4.9* 5.0* 4.6 5.0* 5.0*   GFR: Estimated Creatinine Clearance: 21.6 mL/min (A) (by C-G formula based on SCr of 3.96 mg/dL (H)). Liver Function Tests: Recent Labs  Lab 08/04/17 0428 08/05/17 0440  08/06/17 1618 08/07/17 0405 08/08/17 0519 08/09/17 0355 08/10/17 0412  AST 66* 71*  --   --   --   --   --   --   ALT 29 31  --   --   --   --   --   --   ALKPHOS 81 86  --   --   --   --   --   --   BILITOT 0.7 0.7  --   --   --   --   --   --   PROT 7.8 7.9  --   --   --   --   --   --  ALBUMIN 2.5* 2.6*   < > 2.5* 2.5* 2.5* 2.5* 2.5*   < > = values in this interval not displayed.   No results for input(s): LIPASE, AMYLASE in the last 168 hours. No results for input(s): AMMONIA in the last 168 hours. Coagulation Profile: No results for input(s): INR, PROTIME in the last 168 hours. Cardiac Enzymes: Recent Labs  Lab 08/08/17 0519  CKTOTAL 59   BNP (last 3 results) No results for input(s): PROBNP in the last 8760 hours. HbA1C: No results for input(s): HGBA1C in the last 72 hours. CBG: Recent Labs  Lab 08/06/17 0738 08/07/17 0747 08/08/17 0800 08/09/17 0743 08/10/17 0750  GLUCAP 90 87 90 103* 102*   Lipid Profile: No results for input(s): CHOL, HDL, LDLCALC, TRIG, CHOLHDL, LDLDIRECT in the last 72 hours. Thyroid Function Tests: No results for input(s): TSH, T4TOTAL, FREET4, T3FREE, THYROIDAB in the last 72 hours. Anemia Panel: No results for input(s): VITAMINB12, FOLATE, FERRITIN, TIBC, IRON, RETICCTPCT in the last 72 hours. Urine analysis:    Component Value Date/Time   COLORURINE STRAW (A) 07/26/2017 1708   APPEARANCEUR CLEAR 07/26/2017 1708   LABSPEC 1.003 (L) 07/26/2017  1708   PHURINE 5.0 07/26/2017 1708   GLUCOSEU NEGATIVE 07/26/2017 1708   HGBUR LARGE (A) 07/26/2017 1708   BILIRUBINUR NEGATIVE 07/26/2017 1708   KETONESUR NEGATIVE 07/26/2017 1708   PROTEINUR NEGATIVE 07/26/2017 1708   NITRITE NEGATIVE 07/26/2017 1708   LEUKOCYTESUR NEGATIVE 07/26/2017 1708   Sepsis Labs: @LABRCNTIP (procalcitonin:4,lacticacidven:4)  )No results found for this or any previous visit (from the past 240 hour(s)).       Radiology Studies: No results found.      Scheduled Meds: . ALPRAZolam  0.25 mg Oral Daily  . citalopram  20 mg Oral Daily  . darbepoetin (ARANESP) injection - NON-DIALYSIS  100 mcg Subcutaneous Q Thu-1800  . feeding supplement (ENSURE ENLIVE)  237 mL Oral BID BM  . multivitamin with minerals  1 tablet Oral Daily  . nicotine  21 mg Transdermal Daily  . Tenofovir Alafenamide Fumarate  25 mg Oral q morning - 10a   Continuous Infusions: . DAPTOmycin (CUBICIN)  IV Stopped (08/09/17 1100)     LOS: 19 days    Time spent: 25 minutes    Edwin Dada, MD [facility] 08/10/2017, 3:24 PM     Pager [phone] --- please page though AMION:  [email or web address] Password [email or web address] If 7PM-7AM, please contact night-coverage

## 2017-08-11 DIAGNOSIS — K7030 Alcoholic cirrhosis of liver without ascites: Secondary | ICD-10-CM

## 2017-08-11 DIAGNOSIS — R768 Other specified abnormal immunological findings in serum: Secondary | ICD-10-CM

## 2017-08-11 DIAGNOSIS — I2583 Coronary atherosclerosis due to lipid rich plaque: Secondary | ICD-10-CM

## 2017-08-11 DIAGNOSIS — K7200 Acute and subacute hepatic failure without coma: Secondary | ICD-10-CM

## 2017-08-11 DIAGNOSIS — B180 Chronic viral hepatitis B with delta-agent: Secondary | ICD-10-CM

## 2017-08-11 LAB — AMMONIA: AMMONIA: 76 umol/L — AB (ref 9–35)

## 2017-08-11 LAB — COMPREHENSIVE METABOLIC PANEL
ALT: 31 U/L (ref 17–63)
ANION GAP: 8 (ref 5–15)
AST: 66 U/L — AB (ref 15–41)
Albumin: 2.6 g/dL — ABNORMAL LOW (ref 3.5–5.0)
Alkaline Phosphatase: 93 U/L (ref 38–126)
BILIRUBIN TOTAL: 0.9 mg/dL (ref 0.3–1.2)
BUN: 59 mg/dL — AB (ref 6–20)
CHLORIDE: 102 mmol/L (ref 101–111)
CO2: 22 mmol/L (ref 22–32)
Calcium: 8.6 mg/dL — ABNORMAL LOW (ref 8.9–10.3)
Creatinine, Ser: 4.06 mg/dL — ABNORMAL HIGH (ref 0.61–1.24)
GFR, EST AFRICAN AMERICAN: 18 mL/min — AB (ref >60)
GFR, EST NON AFRICAN AMERICAN: 16 mL/min — AB (ref >60)
Glucose, Bld: 142 mg/dL — ABNORMAL HIGH (ref 65–99)
POTASSIUM: 4.3 mmol/L (ref 3.5–5.1)
Sodium: 132 mmol/L — ABNORMAL LOW (ref 135–145)
TOTAL PROTEIN: 8.1 g/dL (ref 6.5–8.1)

## 2017-08-11 LAB — PROTIME-INR
INR: 1.46
PROTHROMBIN TIME: 17.6 s — AB (ref 11.4–15.2)

## 2017-08-11 LAB — GLUCOSE, CAPILLARY: GLUCOSE-CAPILLARY: 93 mg/dL (ref 65–99)

## 2017-08-11 MED ORDER — RIFAXIMIN 550 MG PO TABS
550.0000 mg | ORAL_TABLET | Freq: Two times a day (BID) | ORAL | Status: DC
  Administered 2017-08-11 – 2017-08-12 (×2): 550 mg via ORAL
  Filled 2017-08-11 (×2): qty 1

## 2017-08-11 MED ORDER — OXYCODONE HCL 5 MG PO TABS
5.0000 mg | ORAL_TABLET | Freq: Four times a day (QID) | ORAL | Status: DC | PRN
  Administered 2017-08-11 – 2017-08-15 (×15): 5 mg via ORAL
  Filled 2017-08-11 (×15): qty 1

## 2017-08-11 MED ORDER — OXYCODONE HCL 5 MG PO TABS
5.0000 mg | ORAL_TABLET | Freq: Two times a day (BID) | ORAL | Status: DC | PRN
  Administered 2017-08-11 – 2017-08-12 (×2): 5 mg via ORAL
  Filled 2017-08-11 (×2): qty 1

## 2017-08-11 MED ORDER — LACTULOSE 10 GM/15ML PO SOLN
10.0000 g | Freq: Three times a day (TID) | ORAL | Status: DC
  Administered 2017-08-11 – 2017-08-19 (×22): 10 g via ORAL
  Filled 2017-08-11 (×22): qty 15

## 2017-08-11 NOTE — Progress Notes (Signed)
PROGRESS NOTE    Ryota Treece  EXH:[id number] DOB: 28-Aug-1965 DOA: 07/22/2017 PCP: Harvie Junior, MD      Brief Narrative:  Mr. Satterly is a 52 y.o. M with CAD, HTN, COPD, and substance use disorder, admitted for encephalopathy, suicidality. Found to have MRSA bacteremia and septic arthritis   "In the ED at [facility] he was noted to have leukocytosis, renal failure, anion gap metabolic acidosis, elevated bilirubin and AST as well as worsening anemia from his baseline. Urine drug screen was positive for amphetamines and benzodiazepines. Following admission patient improved, his encephalopathy resolved. Due to concern for suicidal ideation psychiatry was consulted and the patient was cleared from psychiatry standpoint. He incidentally was noted to have 1/4 blood cultures positive for MRSA. He has also been complaining of worsening left shoulder pain and underwent the plain imaging as well as a CT scan which was concerning for osteomyelitis of the glenoid humeral joint. Orthopedics were consulted and recommended transfer to a tertiary center and he was moved to [facility] orthopedic surgery and ID were consulted. Underwent an MRI which was positive for septic arthritis and he was taken to the operating room on 5/21"   Assessment & Plan:  MRSA bacteremia Left shoulder septic arthritis Infectious diseases were consulted.  1/4 blood cultures + for MRSA.  Transthoracic echocardiogram showed no vegetation.  Status post left shoulder irrigation and debridement 5/22.   On daptomycin till June 16.     -Continue daptomycin -Weekly CK level  Hepatic encephalopathy Patient seems slowly more and more confused and somnolent this week.  He has asterixis.  It appears his lactulose and Rifaximin were stopped somewhere along the way and never restarted. -Check ammonia -Restart lactulose, rifaximin  Acute kidney injury on CKD Baseline creatinine 1-1.3.  Creatinine 2.88 admission  here, non-oliguric.  New baseline Creatinine around 4. -Trend creatinine three times a week -Appreciate nephrology input  History of cirrhosis Hepatitis B Sometime in the last few years (patient thinks this was in [place], records are not available to me in [facility]) patient was diagnosed with Cirrhosis.  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  Albumin low, platelets normal, INR moderately high, Bilirubin normal.  Hepatology notes from [facility] Dr. 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  He was started on entecavir, plans for q59monthfollow up.  MELD score now 26.  I have [facility].  They recommend fax referral for "asap appointment" to [phone] the time of discharge so that he can be re-evaluated for transplant, given his worsened MELD. -Continue tenofovir -[facility] Substance use disorder Suicidality Evaluated by Psychiatry during this hospitalization, no furhter suicidal ideation, cleared by Psych. -Continue SSRI -Continue Xanax two more days then stop Review of [place] controlled substances registry shows patient has had rare sporadic xanax and oxycodone prescriptions, none more than 7 days.  He denies illicit drug use other than THC.  However, admission H&P here states he was initially admitted encephalopathic with UDS positive for benzodiazepines, amphetamine.  For this reason, the risk of discharge with PICC in place seems to outweigh benefit of discharge for OPAT.  We will maintain in the hospital until IV antibiotics complete Jun 16 and then pull PICC and discharge home.    In the meantime, he is >2 weeks post-op from shoulder debridement.  He complains of  widespread daily pain, sometimes in his abdomen, sometimes in his shoulder, sometimes headache. OT evaluated him, report he was able to participate well in ROM exercises.     -Start to taper oxycodone -Reduce oxycodone total daily dose by 5 mg per day           DVT prophylaxis: SCDs Code Status: Full code Family Communication: None present MDM and disposition Plan: The below labs and imaging reports were reviewed and summarized above.    He was admitted with MRSA bacteremia, found to have septic left shoulder joint.  This is now status post washout, and he requires IV antibiotics until Jun 16.  Due to concerns for IV drug use, we will complete his antibiotics in a monitored setting, before discharge home.  In the meantime, he has new renal failure.  He also has progression of his liver disease, summarized above.  This week he is developing encephalopathy, for which we will start lactulose, rifaxmin.        Consultants:   Infectious disease  Nephrology  Procedures:   Catheter placement  Antimicrobials:   Daptomycin   Subjective: Confused today.  No diarrhea.  Pain unchanged.  No new pain.  No joint swelling.  No fever.  Objective: Vitals:   08/10/17 1300 08/10/17 2139 08/11/17 0558 08/11/17 1315  BP: 135/84 139/80 (!) 150/98 138/86  Pulse: 84 84 83 86  Resp: 14 16 16    Temp: 98.2 F (36.8 C) 98.1 F (36.7 C) 98.4 F (36.9 C) 97.7 F (36.5 C)  TempSrc: Oral Oral Oral Oral  SpO2: 97% 97% 98% 100%  Weight:   78.2 kg (172 lb 6.4 oz)   Height:        Intake/Output Summary (Last 24 hours) at 08/11/2017 1558 Last data filed at 08/11/2017 1315 Gross per 24 hour  Intake 822 ml  Output 1000 ml  Net -178 ml   Filed Weights   08/09/17 0547 08/10/17 0508 08/11/17 0558  Weight: 78.3 kg (172 lb 9.9 oz) 78.2 kg (172 lb 6.4 oz) 78.2 kg (172 lb 6.4 oz)    Examination: General appearance: Thin adule male, ambulating in room, interactive, slowed. HEENT: Icteric, conjunctiva normal.  Lids and lashes normal.  Nose no discharge or epistaxis.  Edentulous, lips normal.  OP moist, no orals lesions. Skin: Skin warm and dry, no suspicious rashes or  lesions. Cardiac: RRR, no murmurs, no LE edema. Respiratory: Clear to auscultation without wheezes or rales.  Normal rate and rhythm. Abdomen: Small umbilical hernia.  Abdomen tender diffusely, no ecchymosis, swelling, HSM, skin findings.    MSK: No deformities or effusions in the large joints of the upper or lower extremities bilaterally.  Left shoulder still tenderness without visual skin changes.  He has pain with active range of motion of the left shoulder of abduction more than 45 degrees or flexion more than 45 degrees, but passively he is able to extend another 10 degrees both directions. Neuro: Asterixis noted, strength normal in the right arm, gait shuffling, speech fluent.    Psych: Psychomotor slowing is noted, attention is diminished, affect blunted, judgment and insight appear poor.    Data Reviewed: I have personally reviewed following labs and imaging studies:  CBC: Recent Labs  Lab 08/10/17 0412  WBC 6.0  HGB 7.7*  HCT 23.4*  MCV 91.4  PLT 330*   Basic Metabolic Panel: Recent Labs  Lab 08/06/17 1618 08/07/17 0405 08/08/17 0519 08/09/17 0355 08/10/17 0412 08/11/17 1441  NA 138 134* 132* 133*  135 132*  K 3.9 4.1 4.6 4.6 4.6 4.3  CL 103 103 105 106 103 102  CO2 26 22 22 22 23 22   GLUCOSE 118* 134* 93 95 102* 142*  BUN 54* 54* 54* 55* 58* 59*  CREATININE 3.91* 3.82* 4.09* 3.92* 3.96* 4.06*  CALCIUM 8.5* 8.5* 8.1* 8.3* 8.6* 8.6*  PHOS 4.9* 5.0* 4.6 5.0* 5.0*  --    GFR: Estimated Creatinine Clearance: 21.1 mL/min (A) (by C-G formula based on SCr of 4.06 mg/dL (H)). Liver Function Tests: Recent Labs  Lab 08/05/17 0440  08/07/17 0405 08/08/17 0519 08/09/17 0355 08/10/17 0412 08/11/17 1441  AST 71*  --   --   --   --   --  66*  ALT 31  --   --   --   --   --  31  ALKPHOS 86  --   --   --   --   --  93  BILITOT 0.7  --   --   --   --   --  0.9  PROT 7.9  --   --   --   --   --  8.1  ALBUMIN 2.6*   < > 2.5* 2.5* 2.5* 2.5* 2.6*   < > = values in this  interval not displayed.   No results for input(s): LIPASE, AMYLASE in the last 168 hours. Recent Labs  Lab 08/11/17 1441  AMMONIA 76*   Coagulation Profile: Recent Labs  Lab 08/11/17 1441  INR 1.46   Cardiac Enzymes: Recent Labs  Lab 08/08/17 0519  CKTOTAL 59   BNP (last 3 results) No results for input(s): PROBNP in the last 8760 hours. HbA1C: No results for input(s): HGBA1C in the last 72 hours. CBG: Recent Labs  Lab 08/07/17 0747 08/08/17 0800 08/09/17 0743 08/10/17 0750 08/11/17 0750  GLUCAP 87 90 103* 102* 93   Lipid Profile: No results for input(s): CHOL, HDL, LDLCALC, TRIG, CHOLHDL, LDLDIRECT in the last 72 hours. Thyroid Function Tests: No results for input(s): TSH, T4TOTAL, FREET4, T3FREE, THYROIDAB in the last 72 hours. Anemia Panel: No results for input(s): VITAMINB12, FOLATE, FERRITIN, TIBC, IRON, RETICCTPCT in the last 72 hours. Urine analysis:    Component Value Date/Time   COLORURINE STRAW (A) 07/26/2017 1708   APPEARANCEUR CLEAR 07/26/2017 1708   LABSPEC 1.003 (L) 07/26/2017 1708   PHURINE 5.0 07/26/2017 1708   GLUCOSEU NEGATIVE 07/26/2017 1708   HGBUR LARGE (A) 07/26/2017 1708   BILIRUBINUR NEGATIVE 07/26/2017 1708   KETONESUR NEGATIVE 07/26/2017 1708   PROTEINUR NEGATIVE 07/26/2017 1708   NITRITE NEGATIVE 07/26/2017 1708   LEUKOCYTESUR NEGATIVE 07/26/2017 1708   Sepsis Labs: @LABRCNTIP (procalcitonin:4,lacticacidven:4)  )No results found for this or any previous visit (from the past 240 hour(s)).       Radiology Studies: No results found.      Scheduled Meds: . ALPRAZolam  0.25 mg Oral Daily  . citalopram  20 mg Oral Daily  . darbepoetin (ARANESP) injection - NON-DIALYSIS  100 mcg Subcutaneous Q Thu-1800  . feeding supplement (ENSURE ENLIVE)  237 mL Oral BID BM  . lactulose  10 g Oral TID  . multivitamin with minerals  1 tablet Oral Daily  . nicotine  21 mg Transdermal Daily  . rifaximin  550 mg Oral BID  . Tenofovir  Alafenamide Fumarate  25 mg Oral q morning - 10a   Continuous Infusions: . DAPTOmycin (CUBICIN)  IV Stopped (08/11/17 1125)     LOS: 20 days  Time spent: 25 minutes    Edwin Dada, MD [facility] 08/11/2017, 3:58 PM     Pager [phone] --- please page though AMION:  [email or web address] Password [email or web address] If 7PM-7AM, please contact night-coverage

## 2017-08-11 NOTE — Progress Notes (Signed)
Pharmacy Antibiotic Note  Glenn Alvarado is a 52 y.o. male admitted on 07/22/2017 with left shoulder pain.  Found to have MRSA bacteremia and left shoulder septic arthritis.  Pharmacy has been consulted for Cubicin dosing.  Afebrile, WBC WNL. SCr peak of 4.09 now trending back down at 3.96 on 6/7. No new labs today. CrCl ~ 22 mL/min.    Plan: Continue Cubicin 628m IV Q48H through 08/19/17 (~8 mg/kg) Monitor renal fxn, CK weekly qWed   Height: 5' 7.99" (172.7 cm) Weight: 172 lb 6.4 oz (78.2 kg) IBW/kg (Calculated) : 68.38  Temp (24hrs), Avg:98.2 F (36.8 C), Min:98.1 F (36.7 C), Max:98.4 F (36.9 C)  Recent Labs  Lab 08/06/17 1618 08/07/17 0405 08/08/17 0519 08/09/17 0355 08/10/17 0412  WBC  --   --   --   --  6.0  CREATININE 3.91* 3.82* 4.09* 3.92* 3.96*    Estimated Creatinine Clearance: 21.6 mL/min (A) (by C-G formula based on SCr of 3.96 mg/dL (H)).    Allergies  Allergen Reactions  . Codeine Hives  . Acetaminophen Other (See Comments)    Advised not to take d/t liver disease    Vanc 5/20 >>5/21 Cubicin 5/22 >> (6/16)  5/22 CK = 110  5/29 CK = 64 6/5 CK = 59  BCx OSH: 1/4 MRSA 5/20 BCx - negative 5/20 synovial shoulder fluid: neg 5/22 surg wound cx - negative   JSloan Leiter PharmD, BCPS, BCCCP Clinical Pharmacist Clinical phone 08/11/2017 until 3:30PM -- #[phone] hours, please call #[phone]/10/2017, 8:29 AM

## 2017-08-11 NOTE — Plan of Care (Signed)
  Problem: Clinical Measurements: Goal: Cardiovascular complication will be avoided Outcome: Progressing   Problem: Activity: Goal: Risk for activity intolerance will decrease Outcome: Progressing   Problem: Nutrition: Goal: Adequate nutrition will be maintained Outcome: Progressing   Problem: Safety: Goal: Ability to remain free from injury will improve Outcome: Not Progressing  Pt refuses SCD use and does not want injection either. Educated patient on need for one or the other.

## 2017-08-12 LAB — GLUCOSE, CAPILLARY: Glucose-Capillary: 85 mg/dL (ref 65–99)

## 2017-08-12 MED ORDER — HYDROXYZINE HCL 25 MG PO TABS
25.0000 mg | ORAL_TABLET | Freq: Three times a day (TID) | ORAL | Status: DC | PRN
Start: 2017-08-12 — End: 2017-08-19
  Administered 2017-08-15: 25 mg via ORAL
  Filled 2017-08-12 (×2): qty 1

## 2017-08-12 NOTE — Progress Notes (Signed)
PROGRESS NOTE    Glenn Alvarado  IEP:[id number] DOB: 09-Jun-1965 DOA: 07/22/2017 PCP: Harvie Junior, MD      Brief Narrative:  Glenn Alvarado is a 52 y.o. M with CAD, HTN, COPD, and substance use disorder, admitted for encephalopathy, suicidality. Found to have MRSA bacteremia and septic arthritis.   On initial adimssion at [place] he was found to have: - Leukocytosis, renal failure, elevated anion gap metabolic acidosis, elevated bilirubin, worsening anemia -Urine drug screen positive for amphetamines and benzos -Patient was started on broad-spectrum antibiotics -There was question of suicidal ideation and psychiatry were consulted although he was cleared safety - 1/4 blood cultures positive for MRSA -Subsequently left shoulder was imaged with findings suggesting osteomyelitis of the glenohumeral joint - Orthopedics were consulted and recommended transfer to tertiary care center for orthopedics and infectious disease consult -Here he underwent MRI that showed septic arthritis and he was taken to the operating room on 5/21       Assessment & Plan:  MRSA bacteremia Left shoulder septic arthritis Infectious diseases were consulted.  1/4 blood cultures + for MRSA.  Transthoracic echocardiogram showed no vegetation at [place].  Status post left shoulder irrigation and debridement 5/22.   On daptomycin till June 16.     -Continue daptomycin -Weekly CK level  Hepatic encephalopathy She developed progressive somnolence and confusion this week.  Yesterday he had asterixis and an ammonia level was noted to be elevated.  His lactulose was restarted and he seems more alert today. -Continue lactulose  Acute kidney injury on CKD Baseline creatinine 1-1.3.  Creatinine 2.88 admission here, non-oliguric.  New baseline Creatinine around 4. -Trend creatinine three times a week -Appreciate nephrology input  History of cirrhosis Hepatitis B Sometime in the last few years (patient thinks  this was in [place], records are not available to me in [facility]) patient was diagnosed with Cirrhosis.  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  Albumin low, platelets normal, INR moderately high, Bilirubin normal.  Hepatology notes from [facility] Dr. 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  He was started on entecavir, plans for q83monthfollow up.  MELD score now 26.  [facility]/8.  They recommend fax referral for "asap appointment" to [phone] the time of discharge so that he can be re-evaluated for transplant, given his worsened MELD. -Continue tenofovir -Continue lactulose -Needs close follow up with [facility] Substance use disorder Suicidality Evaluated by Psychiatry during this hospitalization, no furhter suicidal ideation, cleared by Psych. -Continue SSRI -Stop Xanax -PRN hydroxyzine for anxiety  Review of [place] controlled substances registry shows patient has had rare sporadic xanax and oxycodone prescriptions, none more than 7 days.  He denies illicit drug use other than THC.  However, admission H&P here states he was initially admitted encephalopathic with UDS positive for benzodiazepines, amphetamine.  For this reason, the risk of discharge with PICC in place seems to outweigh benefit of discharge for OPAT.  We will maintain in the hospital until IV antibiotics complete Jun 16 and then pull PICC and discharge home.    In the meantime, he is >2 weeks post-op from shoulder debridement.  He complains of widespread daily pain, sometimes in his abdomen, sometimes in his shoulder, sometimes headache. OT evaluated him, report he was able to participate well in ROM exercises.   -Continue aggressive oxycodone taper -  Reduce oxycodone total daily dose by 5 mg per day           DVT prophylaxis: SCDs Code Status: Full code Family Communication:  None present MDM and disposition Plan: The below labs and imaging reports were reviewed and summarized above.  He was admitted with MRSA bacteremia, found to have septic left shoulder joint.  This is now status post washout, and he requires IV antibiotics until Jun 16.  Due to concerns for IV drug use, we will complete his antibiotics in a monitored setting, before discharge home.  In the meantime, he has new renal failure.  He also has progression of his liver disease, summarized above.  Over the last several days he developed worsening encephalopathy, for which his lactulose was restarted and he seems to be improving.      Consultants:   Infectious disease  Nephrology  Procedures:   Catheter placement  Antimicrobials:   Daptomycin   Subjective: Mentation improved today.  Has had several bowel movements.  His pain is little bit better in his left arm.  No new pain, no new joint swelling, no fever.  No new rashes.  Objective: Vitals:   08/11/17 1315 08/11/17 1953 08/12/17 0441 08/12/17 1421  BP: 138/86 131/85 (!) 141/80 138/80  Pulse: 86 83 78 73  Resp:  16 16 18   Temp: 97.7 F (36.5 C) 98.4 F (36.9 C) 98.1 F (36.7 C) 98.2 F (36.8 C)  TempSrc: Oral Oral Oral Oral  SpO2: 100% 98% 100% 99%  Weight:   75.3 kg (166 lb 0.1 oz)   Height:        Intake/Output Summary (Last 24 hours) at 08/12/2017 1512 Last data filed at 08/12/2017 1421 Gross per 24 hour  Intake 879 ml  Output 250 ml  Net 629 ml   Filed Weights   08/10/17 0508 08/11/17 0558 08/12/17 0441  Weight: 78.2 kg (172 lb 6.4 oz) 78.2 kg (172 lb 6.4 oz) 75.3 kg (166 lb 0.1 oz)    Examination: General appearance: Thin adult male, ambulating in the room, interactive, conversational. HEENT: Terry, conjunctive are normal, lids and lashes normal, no nasal discharge or epistaxis.  Lips normal, edentulous.  Oropharynx moist, no oral lesions.  Hearing normal. Skin: Skin warm and dry, no suspicious rashes or  lesions. Cardiac: Regular rate and rhythm, no murmurs, no lower extremity edema Respiratory: Lungs clear to auscultation without wheezes or rales, normal rate and rhythm respiration. Abdomen: Small umbilical hernia, abdomen nontender, no ecchymosis, swelling, HSM, skin findings. MSK: No deformities or effusions in the large joints of the upper or lower extremities bilaterally.  Left shoulder still tenderness without visual skin changes.  He has pain with active range of motion of the left shoulder of abduction more than 45 degrees or flexion more than 45 degrees, but passively he is able to extend another 10 degrees both directions. Neuro: No asterixis, normal strength in the right arm, speech fluent, extraocular movements intact, cranial nerves normal.    Psych: No psychomotor slowing, intention normal, affect blunted, judgment and insight appear poor.    Data Reviewed: I have personally reviewed following labs and imaging studies:  CBC: Recent Labs  Lab 08/10/17 0412  WBC 6.0  HGB 7.7*  HCT 23.4*  MCV 91.4  PLT 330*   Basic Metabolic Panel: Recent Labs  Lab 08/06/17 1618 08/07/17 0405 08/08/17 0519 08/09/17 0355 08/10/17 0412 08/11/17 1441  NA 138 134* 132* 133* 135 132*  K 3.9 4.1 4.6 4.6 4.6  4.3  CL 103 103 105 106 103 102  CO2 26 22 22 22 23 22   GLUCOSE 118* 134* 93 95 102* 142*  BUN 54* 54* 54* 55* 58* 59*  CREATININE 3.91* 3.82* 4.09* 3.92* 3.96* 4.06*  CALCIUM 8.5* 8.5* 8.1* 8.3* 8.6* 8.6*  PHOS 4.9* 5.0* 4.6 5.0* 5.0*  --    GFR: Estimated Creatinine Clearance: 21.1 mL/min (A) (by C-G formula based on SCr of 4.06 mg/dL (H)). Liver Function Tests: Recent Labs  Lab 08/07/17 0405 08/08/17 0519 08/09/17 0355 08/10/17 0412 08/11/17 1441  AST  --   --   --   --  66*  ALT  --   --   --   --  31  ALKPHOS  --   --   --   --  93  BILITOT  --   --   --   --  0.9  PROT  --   --   --   --  8.1  ALBUMIN 2.5* 2.5* 2.5* 2.5* 2.6*   No results for input(s): LIPASE,  AMYLASE in the last 168 hours. Recent Labs  Lab 08/11/17 1441  AMMONIA 76*   Coagulation Profile: Recent Labs  Lab 08/11/17 1441  INR 1.46   Cardiac Enzymes: Recent Labs  Lab 08/08/17 0519  CKTOTAL 59   BNP (last 3 results) No results for input(s): PROBNP in the last 8760 hours. HbA1C: No results for input(s): HGBA1C in the last 72 hours. CBG: Recent Labs  Lab 08/08/17 0800 08/09/17 0743 08/10/17 0750 08/11/17 0750 08/12/17 0800  GLUCAP 90 103* 102* 93 85   Lipid Profile: No results for input(s): CHOL, HDL, LDLCALC, TRIG, CHOLHDL, LDLDIRECT in the last 72 hours. Thyroid Function Tests: No results for input(s): TSH, T4TOTAL, FREET4, T3FREE, THYROIDAB in the last 72 hours. Anemia Panel: No results for input(s): VITAMINB12, FOLATE, FERRITIN, TIBC, IRON, RETICCTPCT in the last 72 hours. Urine analysis:    Component Value Date/Time   COLORURINE STRAW (A) 07/26/2017 1708   APPEARANCEUR CLEAR 07/26/2017 1708   LABSPEC 1.003 (L) 07/26/2017 1708   PHURINE 5.0 07/26/2017 1708   GLUCOSEU NEGATIVE 07/26/2017 1708   HGBUR LARGE (A) 07/26/2017 1708   BILIRUBINUR NEGATIVE 07/26/2017 1708   KETONESUR NEGATIVE 07/26/2017 1708   PROTEINUR NEGATIVE 07/26/2017 1708   NITRITE NEGATIVE 07/26/2017 1708   LEUKOCYTESUR NEGATIVE 07/26/2017 1708   Sepsis Labs: @LABRCNTIP (procalcitonin:4,lacticacidven:4)  )No results found for this or any previous visit (from the past 240 hour(s)).       Radiology Studies: No results found.      Scheduled Meds: . ALPRAZolam  0.25 mg Oral Daily  . citalopram  20 mg Oral Daily  . darbepoetin (ARANESP) injection - NON-DIALYSIS  100 mcg Subcutaneous Q Thu-1800  . feeding supplement (ENSURE ENLIVE)  237 mL Oral BID BM  . lactulose  10 g Oral TID  . multivitamin with minerals  1 tablet Oral Daily  . nicotine  21 mg Transdermal Daily  . Tenofovir Alafenamide Fumarate  25 mg Oral q morning - 10a   Continuous Infusions: . DAPTOmycin  (CUBICIN)  IV Stopped (08/11/17 1125)     LOS: 21 days    Time spent: 25 minutes    Edwin Dada, MD [facility] 08/12/2017, 3:12 PM     Pager [phone] --- please page though AMION:  [email or web address] Password [email or web address] If 7PM-7AM, please contact night-coverage

## 2017-08-13 LAB — BASIC METABOLIC PANEL
ANION GAP: 7 (ref 5–15)
BUN: 59 mg/dL — ABNORMAL HIGH (ref 6–20)
CALCIUM: 8.3 mg/dL — AB (ref 8.9–10.3)
CHLORIDE: 100 mmol/L — AB (ref 101–111)
CO2: 23 mmol/L (ref 22–32)
Creatinine, Ser: 3.76 mg/dL — ABNORMAL HIGH (ref 0.61–1.24)
GFR calc non Af Amer: 17 mL/min — ABNORMAL LOW (ref >60)
GFR, EST AFRICAN AMERICAN: 20 mL/min — AB (ref >60)
Glucose, Bld: 115 mg/dL — ABNORMAL HIGH (ref 65–99)
Potassium: 3.8 mmol/L (ref 3.5–5.1)
Sodium: 130 mmol/L — ABNORMAL LOW (ref 135–145)

## 2017-08-13 LAB — GLUCOSE, CAPILLARY: Glucose-Capillary: 103 mg/dL — ABNORMAL HIGH (ref 65–99)

## 2017-08-13 MED ORDER — SODIUM CHLORIDE 0.9% FLUSH
10.0000 mL | INTRAVENOUS | Status: DC | PRN
  Administered 2017-08-13 – 2017-08-17 (×5): 10 mL
  Filled 2017-08-13 (×4): qty 40

## 2017-08-13 NOTE — Progress Notes (Addendum)
Asked patient this morning for family to contact.  Patient recommended I discuss with step-father, [place] listed in contacts.  Step father familiar to some extent with clinical course.  I made aware that he would be discharged on 6/16.  Has new renal failure.  Needs outpatient Nephrology follow up.  Also that patient needs close follow up with Hepatology, Dr. Elease Hashimoto at [facility] for post-discharge appointment for discussion of transplant again.  However, step-father described patient as liar, belligerent, and "constantly getting high for as long as I can remember."  He is unsure what drugs patient takes, but knows that he takes many drugs. As examples of falsehoods, patient will state that he has custody of his children and is wrongly accused of domestic abuse, when in fact he has no custody of any of his children because of repeated threats against family and spouse.  Also that at last appointment at [facility], patient responded that he "was down to 1 cigarette per day, even though he had smoked 3 cigarettes in the 40 minute car ride to the doctor alone and was still smoking 2 packs per day".  Given this, it is unlikely that the patient is able to adhere to pre-requisites for transplant.  Also: patient's step father says that the patient CAN NOT stay with them when he leaves.  Also, that they may or may not be able to take patient to appointments, they should not be depended upon.  Patient will NOT be prescribed opiates at discharge, and needs further aggressive taper this week.

## 2017-08-13 NOTE — Plan of Care (Signed)
  Problem: Pain Managment: Goal: General experience of comfort will improve Outcome: Progressing   

## 2017-08-13 NOTE — Progress Notes (Signed)
PROGRESS NOTE    Glenn Alvarado  GMW:[id number] DOB: 1965-07-04 DOA: 07/22/2017 PCP: Harvie Junior, MD      Brief Narrative:  Glenn Alvarado is a 52 y.o. M with CAD, HTN, COPD, and substance use disorder, admitted for encephalopathy, suicidality. Found to have MRSA bacteremia and septic arthritis.   On initial adimssion at [place] he was found to have: - Leukocytosis, renal failure, elevated anion gap metabolic acidosis, elevated bilirubin, worsening anemia -Urine drug screen positive for amphetamines and benzos -Patient was started on broad-spectrum antibiotics -There was question of suicidal ideation and psychiatry were consulted although he was cleared safety - 1/4 blood cultures positive for MRSA -Subsequently left shoulder was imaged with findings suggesting osteomyelitis of the glenohumeral joint - Orthopedics were consulted and recommended transfer to tertiary care center for orthopedics and infectious disease consult -Here he underwent MRI that showed septic arthritis and he was taken to the operating room on 5/21       Assessment & Plan:  MRSA bacteremia Left shoulder septic arthritis Infectious diseases were consulted.  1/4 blood cultures + for MRSA.  Transthoracic echocardiogram showed no vegetation at [place].  Status post left shoulder irrigation and debridement 5/22.   On daptomycin till June 16.     -Continue daptomycin for 4 more doses, completed on 6/16 -Weekly CK level  Hepatic encephalopathy She developed progressive somnolence and confusion this week.  Yesterday he had asterixis and an ammonia level was noted to be elevated.  His lactulose was restarted and he seems more alert today. -Continue lactulose  Acute kidney injury on CKD Baseline creatinine 1-1.3.  Creatinine 2.88 admission here, non-oliguric.  New baseline Creatinine around 4.  Stable today. -Trend creatinine three times a week -Appreciate nephrology input  History of cirrhosis Hepatitis  B Sometime in the last few years (patient thinks this was in [place], records are not available to me in [facility]) patient was diagnosed with Cirrhosis.  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  Albumin low, platelets normal, INR moderately high, Bilirubin normal.  Hepatology notes from [facility] Dr. 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  He was started on entecavir, plans for q85monthfollow up.  MELD score now 26.  [facility].  They recommend fax referral for "asap appointment" to [phone] the time of discharge so that he can be re-evaluated for transplant, given his worsened MELD. -Continue tenofovir -Continue lactulose -[facility] Substance use disorder Suicidality Evaluated by Psychiatry during this hospitalization, no furhter suicidal ideation, cleared by Psych. -Continue SSRI -Xanax was stopped over the weekend -Continue hydroxyzine as needed for anxiety  Review of [place] controlled substances registry shows patient has had rare sporadic xanax and oxycodone prescriptions, none more than 7 days.  He denies illicit drug use other than THC.  However, admission H&P here states he was initially admitted encephalopathic with UDS positive for benzodiazepines, amphetamine.  For this reason, the risk of discharge with PICC in place seems to outweigh benefit of discharge for OPAT.  We will maintain in the hospital until IV antibiotics complete Jun 16 and then pull PICC and discharge home.    In the meantime, he is >2 weeks post-op from shoulder debridement.  He complains of widespread daily pain, sometimes in his abdomen, sometimes in his shoulder, sometimes headache. OT evaluated him,  report he was able to participate well in ROM exercises.   -Continue oxycodone taper     Oxycodone 5 mg QID prn today and tomorrow     Oxycodone  5 mg TID prn Weds, thurs     Oxycodone 5 mg BID prn Fri, sat     Oxycodone 5 mg once daily prn Sun on day of discharge   Taper has been discussed with patient.  It was made clear to patient that he would not be discharged with opiates.       DVT prophylaxis: SCDs Code Status: Full code Family Communication: None present MDM and disposition Plan: The below labs and imaging reports were reviewed and summarized above.  He was admitted with MRSA bacteremia, found to have septic left shoulder joint.  This is now status post washout, and he requires IV antibiotics until Jun 16.  Due to concerns for IV drug use, we will complete his antibiotics in a monitored setting, before discharge home.  In the meantime, he has new renal failure.  He also has progression of his liver disease, summarized above.  He then developed worsening encephalopathy, for which his lactulose was restarted and he seems to be improving.      Consultants:   Infectious disease  Nephrology  Procedures:   Catheter placement  Antimicrobials:   Daptomycin   Subjective: Mentation good today.  Is having bowel movements.  Pain is clearly better in his arm because he is leaning on his left elbow today.  No new joint swelling, joint pain.  No new vomiting, diarrhea.  No change in chronic abdominal pain.  No fever.  Objective: Vitals:   08/12/17 0441 08/12/17 1421 08/12/17 2124 08/13/17 0525  BP: (!) 141/80 138/80 137/80 135/85  Pulse: 78 73 93 75  Resp: 16 18 17 17   Temp: 98.1 F (36.7 C) 98.2 F (36.8 C) 98.4 F (36.9 C) 98.2 F (36.8 C)  TempSrc: Oral Oral Oral Oral  SpO2: 100% 99% 100% 100%  Weight: 75.3 kg (166 lb 0.1 oz)   76 kg (167 lb 8.8 oz)  Height:        Intake/Output Summary (Last 24 hours) at 08/13/2017 0742 Last data filed at 08/13/2017 0528 Gross per 24 hour  Intake 1277 ml  Output -  Net 1277 ml   Filed Weights   08/11/17 0558 08/12/17 0441 08/13/17 0525  Weight: 78.2 kg (172 lb  6.4 oz) 75.3 kg (166 lb 0.1 oz) 76 kg (167 lb 8.8 oz)    Examination: General appearance: Well male, lying in bed, interactive.  No acute distress. HEENT: Sclera icteric, conjunctivae lids and lashes normal, no nasal discharge, epistaxis, oropharynx moist no oral lesions.  Lips normal, edentulous. Skin: Skin warm and dry, no suspicious rashes or lesions. Cardiac: Regular rate and rhythm, no murmurs, no lower extremity edema Respiratory: Clear to auscultation without wheezes or rales, normal rate and rhythm respiration Abdomen: Mild umbilical hernia, abdomen nontender, no ecchymosis, swelling, HSM. MSK: He has his left shoulder abducted today, and he is leaning on his left elbow, has no apparent pain with full range of motion of the left shoulder today. Neuro: No asterixis, normal strength in the bilateral arms, speech fluent, extraocular movements intact.    Psych: Appears slightly anxious, no psychomotor slowing, attention normal, affect blunted, judgment and insight appear poor    Data Reviewed: I have personally reviewed following labs and imaging studies:  CBC: Recent Labs  Lab 08/10/17 0412  WBC 6.0  HGB  7.7*  HCT 23.4*  MCV 91.4  PLT 301*   Basic Metabolic Panel: Recent Labs  Lab 08/06/17 1618 08/07/17 0405 08/08/17 0519 08/09/17 0355 08/10/17 0412 08/11/17 1441 08/13/17 0423  NA 138 134* 132* 133* 135 132* 130*  K 3.9 4.1 4.6 4.6 4.6 4.3 3.8  CL 103 103 105 106 103 102 100*  CO2 26 22 22 22 23 22 23   GLUCOSE 118* 134* 93 95 102* 142* 115*  BUN 54* 54* 54* 55* 58* 59* 59*  CREATININE 3.91* 3.82* 4.09* 3.92* 3.96* 4.06* 3.76*  CALCIUM 8.5* 8.5* 8.1* 8.3* 8.6* 8.6* 8.3*  PHOS 4.9* 5.0* 4.6 5.0* 5.0*  --   --    GFR: Estimated Creatinine Clearance: 22.7 mL/min (A) (by C-G formula based on SCr of 3.76 mg/dL (H)). Liver Function Tests: Recent Labs  Lab 08/07/17 0405 08/08/17 0519 08/09/17 0355 08/10/17 0412 08/11/17 1441  AST  --   --   --   --  66*  ALT   --   --   --   --  31  ALKPHOS  --   --   --   --  93  BILITOT  --   --   --   --  0.9  PROT  --   --   --   --  8.1  ALBUMIN 2.5* 2.5* 2.5* 2.5* 2.6*   No results for input(s): LIPASE, AMYLASE in the last 168 hours. Recent Labs  Lab 08/11/17 1441  AMMONIA 76*   Coagulation Profile: Recent Labs  Lab 08/11/17 1441  INR 1.46   Cardiac Enzymes: Recent Labs  Lab 08/08/17 0519  CKTOTAL 59   BNP (last 3 results) No results for input(s): PROBNP in the last 8760 hours. HbA1C: No results for input(s): HGBA1C in the last 72 hours. CBG: Recent Labs  Lab 08/08/17 0800 08/09/17 0743 08/10/17 0750 08/11/17 0750 08/12/17 0800  GLUCAP 90 103* 102* 93 85   Lipid Profile: No results for input(s): CHOL, HDL, LDLCALC, TRIG, CHOLHDL, LDLDIRECT in the last 72 hours. Thyroid Function Tests: No results for input(s): TSH, T4TOTAL, FREET4, T3FREE, THYROIDAB in the last 72 hours. Anemia Panel: No results for input(s): VITAMINB12, FOLATE, FERRITIN, TIBC, IRON, RETICCTPCT in the last 72 hours. Urine analysis:    Component Value Date/Time   COLORURINE STRAW (A) 07/26/2017 1708   APPEARANCEUR CLEAR 07/26/2017 1708   LABSPEC 1.003 (L) 07/26/2017 1708   PHURINE 5.0 07/26/2017 1708   GLUCOSEU NEGATIVE 07/26/2017 1708   HGBUR LARGE (A) 07/26/2017 1708   BILIRUBINUR NEGATIVE 07/26/2017 1708   KETONESUR NEGATIVE 07/26/2017 1708   PROTEINUR NEGATIVE 07/26/2017 1708   NITRITE NEGATIVE 07/26/2017 1708   LEUKOCYTESUR NEGATIVE 07/26/2017 1708   Sepsis Labs: @LABRCNTIP (procalcitonin:4,lacticacidven:4)  )No results found for this or any previous visit (from the past 240 hour(s)).       Radiology Studies: No results found.      Scheduled Meds: . citalopram  20 mg Oral Daily  . darbepoetin (ARANESP) injection - NON-DIALYSIS  100 mcg Subcutaneous Q Thu-1800  . feeding supplement (ENSURE ENLIVE)  237 mL Oral BID BM  . lactulose  10 g Oral TID  . multivitamin with minerals  1 tablet  Oral Daily  . nicotine  21 mg Transdermal Daily  . Tenofovir Alafenamide Fumarate  25 mg Oral q morning - 10a   Continuous Infusions: . DAPTOmycin (CUBICIN)  IV Stopped (08/11/17 1125)     LOS: 22 days    Time spent: 25 minutes  Edwin Dada, MD [facility] 08/13/2017, 7:42 AM     Pager [phone] --- please page though AMION:  [email or web address] Password [email or web address] If 7PM-7AM, please contact night-coverage

## 2017-08-13 NOTE — Progress Notes (Signed)
Occupational Therapy Treatment Patient Details Name: Glenn Alvarado MRN: [id number] DOB: 1965-12-04 Today's Date: 08/13/2017    History of present illness Glenn Alvarado is a 52yo white male who comes to [facility] on 5/22 after 3 weeks shoulder pain, now presenting s/p Left  GHJ I&D. 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    OT comments  Session focused on reviewing HEP for L UE exercises from previous OT sessions. Pt standing for L shoulder exercises in all planes of movement against gravity and also returning to supine for support. NO PROM performed secondary to pt with increased pain prior to OT arrival. Pt also perseverating throughout session on medications and discharge date with mod multimodal cues for redirection.   Follow Up Recommendations  No OT follow up    Equipment Recommendations  None recommended by OT    Recommendations for Other Services      Precautions / Restrictions Precautions Precautions: None Required Braces or Orthoses: Sling(for comfort) Restrictions Weight Bearing Restrictions: No LUE Weight Bearing: Weight bearing as tolerated       Mobility Bed Mobility Overal bed mobility: Independent         Transfers Overall transfer level: Independent        Balance Overall balance assessment: Modified Independent;History of Falls          ADL either performed or assessed with clinical judgement        Vision Patient Visual Report: No change from baseline            Cognition Arousal/Alertness: Awake/alert Behavior During Therapy: WFL for tasks assessed/performed Overall Cognitive Status: History of cognitive impairments - at baseline                        Pertinent Vitals/ Pain       Pain Assessment: 0-10 Pain Score: 8  Pain Location: L shoulder Pain Descriptors / Indicators: Sore;Grimacing         Frequency  Min 3X/week        Progress  Toward Goals  OT Goals(current goals can now be found in the care plan section)  Progress towards OT goals: Progressing toward goals  Acute Rehab OT Goals Patient Stated Goal: go home with brother OT Goal Formulation: With patient Time For Goal Achievement: 08/27/17 Potential to Achieve Goals: Good  Plan Discharge plan remains appropriate       AM-PAC PT "6 Clicks" Daily Activity     Outcome Measure   Help from another person eating meals?: None Help from another person taking care of personal grooming?: None Help from another person toileting, which includes using toliet, bedpan, or urinal?: None Help from another person bathing (including washing, rinsing, drying)?: None Help from another person to put on and taking off regular upper body clothing?: None Help from another person to put on and taking off regular lower body clothing?: None 6 Click Score: 24    End of Session    OT Visit Diagnosis: Pain;Other symptoms and signs involving cognitive function Pain - Right/Left: Left Pain - part of body: Shoulder   Activity Tolerance Patient tolerated treatment well;Patient limited by pain   Patient Left in bed;with call bell/phone within reach   Nurse Communication Mobility status;Patient requests pain meds        Time: 1015-1035 OT Time Calculation (min): 20 min  Charges: OT General Charges $OT Visit: 1 Visit OT Treatments $Therapeutic Exercise: 8-22 mins  Gypsy Decant, MS, OTR/L, CBIS 08/13/2017, 11:28 AM

## 2017-08-14 LAB — GLUCOSE, CAPILLARY: Glucose-Capillary: 136 mg/dL — ABNORMAL HIGH (ref 65–99)

## 2017-08-14 NOTE — Plan of Care (Signed)
  Problem: Pain Managment: Goal: General experience of comfort will improve Outcome: Progressing   

## 2017-08-14 NOTE — Progress Notes (Signed)
Nutrition Follow-up  DOCUMENTATION CODES:   Not applicable  INTERVENTION:   -Continue MVI with minerals daily -Continue Ensure Enlive po BID, each supplement provides 350 kcal and 20 grams of protein  NUTRITION DIAGNOSIS:   Increased nutrient needs related to chronic illness(COPD) as evidenced by estimated needs.  Ongoing  GOAL:   Patient will meet greater than or equal to 90% of their needs  Progressing  MONITOR:   Supplement acceptance, Diet advancement, Labs, Weight trends  REASON FOR ASSESSMENT:   Malnutrition Screening Tool    ASSESSMENT:   52 year old male with PMH significant for hypertension, hyperlipidemia, COPD, GERD, depression, anxiety, CAD, substance abuse, and tobacco abuse. Pt presented to [facility] on 07/19/17 with AMS likely due to drug abuse and was transferred to [facility] for evaluation and treatment of left shoulder pain.  5/22- s/pProcedure: Arthroscopic extensive debridementand arthroscopic synovial biopsy 5/29- s/p rt IJ placement  Reviewed I/O's: +975 ml x 24 hours and -2 L since 07/31/17  Reviewed wt; noted wt had been stable up until today (wt reading 138# vs 167# on 08/13/17). Question accuracy of most recent wt.   Per hospitalist note, pt will have close follow-up with [facility] hepatology related to worsening MELD score.   Pt continues with good appetite; noted meal completion 65-100% of meals. Pt is also consuming Ensure supplements as ordered. Plan to remain in hospital until 08/19/17 for completion of IV antibiotics.   Labs reviewed: Na: 130, CBGS: 85-136.   Diet Order:   Diet Order           Diet 2 gram sodium Room service appropriate? Yes; Fluid consistency: Thin  Diet effective now          EDUCATION NEEDS:   No education needs have been identified at this time  Skin:  Skin Assessment: Skin Integrity Issues: Skin Integrity Issues:: Incisions Incisions: L shoulder  Last BM:  08/13/17  Height:   Ht Readings from Last 1  Encounters:  07/25/17 5' 7.99" (1.727 m)    Weight:   Wt Readings from Last 1 Encounters:  08/14/17 138 lb 10.7 oz (62.9 kg)    Ideal Body Weight:  70 kg  BMI:  Body mass index is 21.09 kg/m.  Estimated Nutritional Needs:   Kcal:  2100-2300 kcal/day  Protein:  95-110 grams/day  Fluid:  2.1-2.3 L/day    Cyra Spader A. Jimmye Norman, RD, LDN, CDE Pager: [phone] After hours Pager: [phone]

## 2017-08-14 NOTE — Progress Notes (Signed)
PROGRESS NOTE  Glenn Alvarado CMK:[id number] DOB: 05-08-1965 DOA: 07/22/2017 PCP: Harvie Junior, MD   LOS: 23 days   Brief Narrative / Interim history: 52 year old male with history of coronary artery disease, hypertension, COPD, polysubstance abuse, liver cirrhosis, who was admitted to the hospital on 07/22/2017 from [facility] with MRSA bacteremia and septic arthritis to his left shoulder.  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  Initial blood cultures at [facility] prior to admission were positive for MRSA.  TTE showed no vegetation.  Orthopedic surgery was consulted as well as for his left shoulder he is status post irrigation and debridement on 5/22.  Patient currently is on daptomycin, and per infectious disease he is to complete daptomycin on 6/16.  Continue to monitor weekly CK level  Liver cirrhosis/[name]/hepatic encephalopathy -Patient was slightly worsening confusion earlier, his ammonia level was noted to be elevated and his lactulose was restarted.  Appears to back to baseline and appears alert and oriented today -Sometime in the last few years (patient thinks this was in [place], records are not available to me in [facility]) patient was diagnosed with Cirrhosis.  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  Albumin low,  platelets normal, INR moderately high, Bilirubin normal.  Hepatology notes from [facility] Dr. 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  He was started on entecavir, plans for q32monthfollow up. -MELD score now 26.  Dr. DLoleta Bookscalled to his Hepatologists at [facility] spoke with on-call on 6/8.  They recommend fax referral for "asap appointment" to [phone] the time of discharge so that he can be re-evaluated for transplant, given his worsened MELD.  Polysubstance use, prior suicidal ideation -Evaluated by psychiatry during his hospitalization, no further SI, cleared.  Continue SSRI  He denies illicit drug use other than THC.  However, admission H&P here states he was initially admitted encephalopathic with UDS positive for benzodiazepines, amphetamine.  For this reason, the risk of discharge with PICC in place seems to outweigh benefit of discharge for OPAT.  We will maintain in the hospital until IV antibiotics complete Jun 16 and then pull PICC and discharge home.    In the meantime, he is >2 weeks post-op from shoulder debridement.  He complains of widespread daily pain, sometimes in his abdomen, sometimes in his shoulder, sometimes headache. OT evaluated him, report he was able to participate well in ROM exercises.   -Continue oxycodone taper     Oxycodone 5 mg QID prn today and tomorrow     Oxycodone 5 mg TID prn Weds, thurs     Oxycodone 5 mg BID prn Fri, sat     Oxycodone 5 mg once daily prn Sun on day of discharge   Acute kidney injury and chronic kidney  disease stage III -Nephrology was consulted, feel like creatinine is now worsening around 4 and this represents a new baseline for the patient.  They feel like it is from ATN with decreased oral intake/volume depletion.    DVT prophylaxis: SCDs Code Status: Full code Family Communication: No family present at bedside Disposition Plan: Home Sunday after the  last daptomycin dose  Consultants:   Orthopedic surgery  Infectious disease  Nephrology  Procedures:   Left shoulder surgery  Antimicrobials:  Daptomycin   Subjective: - no chest pain, shortness of breath, no abdominal pain, nausea or vomiting.   Objective: Vitals:   08/13/17 0525 08/13/17 1426 08/13/17 2159 08/14/17 0653  BP: 135/85 140/70 (!) 147/83 (!) 145/77  Pulse: 75 75 78 67  Resp: 17 19 17 17   Temp: 98.2 F (36.8 C) 98.2 F (36.8 C) 98.6 F (37 C) 98.2 F (36.8 C)  TempSrc: Oral Oral Oral Oral  SpO2: 100% 100% 100% 99%  Weight: 76 kg (167 lb 8.8 oz)   62.9 kg (138 lb 10.7 oz)  Height:        Intake/Output Summary (Last 24 hours) at 08/14/2017 1356 Last data filed at 08/14/2017 0940 Gross per 24 hour  Intake 1456 ml  Output 7 ml  Net 1449 ml   Filed Weights   08/12/17 0441 08/13/17 0525 08/14/17 0653  Weight: 75.3 kg (166 lb 0.1 oz) 76 kg (167 lb 8.8 oz) 62.9 kg (138 lb 10.7 oz)    Examination:  Constitutional: NAD Eyes: lids and conjunctivae normal ENMT: Mucous membranes are moist.  Neck: normal, supple Respiratory: clear to auscultation bilaterally, no wheezing, no crackles. Cardiovascular: Regular rate and rhythm, no murmurs / rubs / gallops. No LE edema. Abdomen: no tenderness. Bowel sounds positive.  Skin: no rashes Neurologic: non focal, mild asterixis   Data Reviewed: I have independently reviewed following labs and imaging studies   CBC: Recent Labs  Lab 08/10/17 0412  WBC 6.0  HGB 7.7*  HCT 23.4*  MCV 91.4  PLT 791*   Basic Metabolic Panel: Recent Labs  Lab 08/08/17 0519 08/09/17 0355 08/10/17 0412 08/11/17 1441 08/13/17 0423  NA 132* 133* 135 132* 130*  K 4.6 4.6 4.6 4.3 3.8  CL 105 106 103 102 100*  CO2 22 22 23 22 23   GLUCOSE 93 95 102* 142* 115*  BUN 54* 55* 58* 59* 59*  CREATININE 4.09* 3.92* 3.96* 4.06* 3.76*  CALCIUM 8.1* 8.3* 8.6* 8.6* 8.3*  PHOS 4.6 5.0* 5.0*  --   --    GFR: Estimated Creatinine  Clearance: 20.9 mL/min (A) (by C-G formula based on SCr of 3.76 mg/dL (H)). Liver Function Tests: Recent Labs  Lab 08/08/17 0519 08/09/17 0355 08/10/17 0412 08/11/17 1441  AST  --   --   --  66*  ALT  --   --   --  31  ALKPHOS  --   --   --  93  BILITOT  --   --   --  0.9  PROT  --   --   --  8.1  ALBUMIN 2.5* 2.5* 2.5* 2.6*   No results for input(s): LIPASE, AMYLASE in the last 168 hours. Recent Labs  Lab 08/11/17 1441  AMMONIA 76*   Coagulation Profile: Recent Labs  Lab 08/11/17 1441  INR 1.46   Cardiac Enzymes: Recent Labs  Lab 08/08/17 0519  CKTOTAL 59   BNP (last 3 results) No results for input(s): PROBNP in the last 8760 hours. HbA1C:  No results for input(s): HGBA1C in the last 72 hours. CBG: Recent Labs  Lab 08/10/17 0750 08/11/17 0750 08/12/17 0800 08/13/17 0753 08/14/17 0814  GLUCAP 102* 93 85 103* 136*   Lipid Profile: No results for input(s): CHOL, HDL, LDLCALC, TRIG, CHOLHDL, LDLDIRECT in the last 72 hours. Thyroid Function Tests: No results for input(s): TSH, T4TOTAL, FREET4, T3FREE, THYROIDAB in the last 72 hours. Anemia Panel: No results for input(s): VITAMINB12, FOLATE, FERRITIN, TIBC, IRON, RETICCTPCT in the last 72 hours. Urine analysis:    Component Value Date/Time   COLORURINE STRAW (A) 07/26/2017 1708   APPEARANCEUR CLEAR 07/26/2017 1708   LABSPEC 1.003 (L) 07/26/2017 1708   PHURINE 5.0 07/26/2017 1708   GLUCOSEU NEGATIVE 07/26/2017 1708   HGBUR LARGE (A) 07/26/2017 1708   BILIRUBINUR NEGATIVE 07/26/2017 1708   KETONESUR NEGATIVE 07/26/2017 1708   PROTEINUR NEGATIVE 07/26/2017 1708   NITRITE NEGATIVE 07/26/2017 1708   LEUKOCYTESUR NEGATIVE 07/26/2017 1708   Sepsis Labs: Invalid input(s): PROCALCITONIN, LACTICIDVEN  No results found for this or any previous visit (from the past 240 hour(s)).    Radiology Studies: No results found.   Scheduled Meds: . citalopram  20 mg Oral Daily  . darbepoetin (ARANESP) injection -  NON-DIALYSIS  100 mcg Subcutaneous Q Thu-1800  . feeding supplement (ENSURE ENLIVE)  237 mL Oral BID BM  . lactulose  10 g Oral TID  . multivitamin with minerals  1 tablet Oral Daily  . nicotine  21 mg Transdermal Daily  . Tenofovir Alafenamide Fumarate  25 mg Oral q morning - 10a   Continuous Infusions: . DAPTOmycin (CUBICIN)  IV Stopped (08/13/17 1144)     Marzetta Board, MD, PhD [facility] Pager [phone]  If 7PM-7AM, please contact night-coverage [email or web address] Password [email or web address] 08/14/2017, 1:56 PM

## 2017-08-15 LAB — CBC
HCT: 25.7 % — ABNORMAL LOW (ref 39.0–52.0)
Hemoglobin: 8.4 g/dL — ABNORMAL LOW (ref 13.0–17.0)
MCH: 29.3 pg (ref 26.0–34.0)
MCHC: 32.7 g/dL (ref 30.0–36.0)
MCV: 89.5 fL (ref 78.0–100.0)
PLATELETS: 139 10*3/uL — AB (ref 150–400)
RBC: 2.87 MIL/uL — ABNORMAL LOW (ref 4.22–5.81)
RDW: 14.6 % (ref 11.5–15.5)
WBC: 7.4 10*3/uL (ref 4.0–10.5)

## 2017-08-15 LAB — BASIC METABOLIC PANEL
ANION GAP: 10 (ref 5–15)
BUN: 61 mg/dL — ABNORMAL HIGH (ref 6–20)
CO2: 23 mmol/L (ref 22–32)
Calcium: 8.4 mg/dL — ABNORMAL LOW (ref 8.9–10.3)
Chloride: 97 mmol/L — ABNORMAL LOW (ref 101–111)
Creatinine, Ser: 3.29 mg/dL — ABNORMAL HIGH (ref 0.61–1.24)
GFR calc non Af Amer: 20 mL/min — ABNORMAL LOW (ref >60)
GFR, EST AFRICAN AMERICAN: 24 mL/min — AB (ref >60)
GLUCOSE: 92 mg/dL (ref 65–99)
Potassium: 4.1 mmol/L (ref 3.5–5.1)
Sodium: 130 mmol/L — ABNORMAL LOW (ref 135–145)

## 2017-08-15 LAB — CK: Total CK: 67 U/L (ref 49–397)

## 2017-08-15 LAB — GLUCOSE, CAPILLARY: Glucose-Capillary: 105 mg/dL — ABNORMAL HIGH (ref 65–99)

## 2017-08-15 MED ORDER — OXYCODONE HCL 5 MG PO TABS
5.0000 mg | ORAL_TABLET | Freq: Three times a day (TID) | ORAL | Status: DC | PRN
  Administered 2017-08-15 – 2017-08-16 (×3): 5 mg via ORAL
  Filled 2017-08-15 (×3): qty 1

## 2017-08-15 NOTE — Progress Notes (Signed)
PROGRESS NOTE  Glenn Alvarado AYT:[id number] DOB: 07/14/1965 DOA: 07/22/2017 PCP: Harvie Junior, MD   LOS: 24 days   Brief Narrative / Interim history: 52 year old male with history of coronary artery disease, hypertension, COPD, polysubstance abuse, liver cirrhosis, who was admitted to the hospital on 07/22/2017 from [facility] with MRSA bacteremia and septic arthritis to his left shoulder.  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  Initial blood cultures at [facility] prior to admission were positive for MRSA.  TTE showed no vegetation.  Orthopedic surgery was consulted as well as for his left shoulder he is status post irrigation and debridement on 5/22.  Patient currently is on daptomycin, and per infectious disease he is to complete daptomycin on 6/16.  Continue to monitor weekly CK level -afebrile  Liver cirrhosis/[name]/hepatic encephalopathy -Patient was slightly worsening confusion earlier, his ammonia level was noted to be elevated and his lactulose was restarted.  Appears to back to baseline and appears alert and oriented today -Sometime in the last few years (patient thinks this was in [place], records are not available to me in [facility]) patient was diagnosed with Cirrhosis.  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   Albumin low, platelets normal, INR moderately high, Bilirubin normal.  Hepatology notes from [facility] Dr. 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  He was started on entecavir, plans for q59monthfollow up. -MELD score now 26.  Dr. DLoleta Bookscalled [facility] with on[facility]call on 6/8.  They recommend fax referral for "asap appointment" to [phone] the time of discharge so that he can be re-evaluated for transplant, given his worsened MELD.  Polysubstance use, prior suicidal ideation -Evaluated by psychiatry during his hospitalization, no further SI, cleared.  Continue SSRI  He denies illicit drug use other than THC.  However, admission H&P here states he was initially admitted encephalopathic with UDS positive for benzodiazepines, amphetamine.  For this reason, the risk of discharge with PICC in place seems to outweigh benefit of discharge for OPAT.  We will maintain in the hospital until IV antibiotics complete Jun 16 and then pull PICC and discharge home.    In the meantime, he is >2 weeks post-op from shoulder debridement.  He complains of widespread daily pain, sometimes in his abdomen, sometimes in his shoulder, sometimes headache. OT evaluated him, report he was able to participate well in ROM exercises.   -Continue oxycodone taper     Oxycodone 5 mg QID prn today and tomorrow     Oxycodone 5 mg TID prn Weds, thurs     Oxycodone 5 mg BID prn Fri, sat     Oxycodone 5 mg once daily prn Sun on day of discharge   Acute kidney injury and chronic  kidney disease stage III -Nephrology was consulted, feel like creatinine is now worsening around 4 and this represents a new baseline for the patient.  They feel like it is from ATN with decreased oral intake/volume depletion. -Cr continues to improve today    DVT prophylaxis: SCDs Code Status: Full code Family Communication: No family present at  bedside Disposition Plan: Home Sunday after the last daptomycin dose  Consultants:   Orthopedic surgery  Infectious disease  Nephrology  Procedures:   Left shoulder surgery  Antimicrobials:  Daptomycin   Subjective: - no complaints  Objective: Vitals:   08/14/17 0653 08/14/17 1417 08/14/17 2120 08/15/17 0619  BP: (!) 145/77 134/80 (!) 142/85 133/67  Pulse: 67 79 77 67  Resp: 17 20 18 19   Temp: 98.2 F (36.8 C) 98.6 F (37 C) 98.8 F (37.1 C) 98.4 F (36.9 C)  TempSrc: Oral Oral Oral Oral  SpO2: 99% 100% 100% 100%  Weight: 62.9 kg (138 lb 10.7 oz)   66.7 kg (147 lb 0.8 oz)  Height:        Intake/Output Summary (Last 24 hours) at 08/15/2017 1146 Last data filed at 08/15/2017 1015 Gross per 24 hour  Intake 2358 ml  Output -  Net 2358 ml   Filed Weights   08/13/17 0525 08/14/17 0653 08/15/17 0619  Weight: 76 kg (167 lb 8.8 oz) 62.9 kg (138 lb 10.7 oz) 66.7 kg (147 lb 0.8 oz)    Examination:  Constitutional: NAD Respiratory: CTA Cardiovascular: RRR  Data Reviewed: I have independently reviewed following labs and imaging studies   CBC: Recent Labs  Lab 08/10/17 0412  WBC 6.0  HGB 7.7*  HCT 23.4*  MCV 91.4  PLT 102*   Basic Metabolic Panel: Recent Labs  Lab 08/09/17 0355 08/10/17 0412 08/11/17 1441 08/13/17 0423 08/15/17 0327  NA 133* 135 132* 130* 130*  K 4.6 4.6 4.3 3.8 4.1  CL 106 103 102 100* 97*  CO2 22 23 22 23 23   GLUCOSE 95 102* 142* 115* 92  BUN 55* 58* 59* 59* 61*  CREATININE 3.92* 3.96* 4.06* 3.76* 3.29*  CALCIUM 8.3* 8.6* 8.6* 8.3* 8.4*  PHOS 5.0* 5.0*  --   --   --    GFR: Estimated Creatinine Clearance: 25.3 mL/min (A) (by C-G formula based on SCr of 3.29 mg/dL (H)). Liver Function Tests: Recent Labs  Lab 08/09/17 0355 08/10/17 0412 08/11/17 1441  AST  --   --  66*  ALT  --   --  31  ALKPHOS  --   --  93  BILITOT  --   --  0.9  PROT  --   --  8.1  ALBUMIN 2.5* 2.5* 2.6*   No results for input(s): LIPASE,  AMYLASE in the last 168 hours. Recent Labs  Lab 08/11/17 1441  AMMONIA 76*   Coagulation Profile: Recent Labs  Lab 08/11/17 1441  INR 1.46   Cardiac Enzymes: Recent Labs  Lab 08/15/17 0327  CKTOTAL 67   BNP (last 3 results) No results for input(s): PROBNP in the last 8760 hours. HbA1C: No results for input(s): HGBA1C in the last 72 hours. CBG: Recent Labs  Lab 08/11/17 0750 08/12/17 0800 08/13/17 0753 08/14/17 0814 08/15/17 0745  GLUCAP 93 85 103* 136* 105*   Lipid Profile: No results for input(s): CHOL, HDL, LDLCALC, TRIG, CHOLHDL, LDLDIRECT in the last 72 hours. Thyroid Function Tests: No results for input(s): TSH, T4TOTAL, FREET4, T3FREE, THYROIDAB in the last 72 hours. Anemia Panel: No  results for input(s): VITAMINB12, FOLATE, FERRITIN, TIBC, IRON, RETICCTPCT in the last 72 hours. Urine analysis:    Component Value Date/Time   COLORURINE STRAW (A) 07/26/2017 1708   APPEARANCEUR CLEAR 07/26/2017 1708   LABSPEC 1.003 (L) 07/26/2017 1708   PHURINE 5.0 07/26/2017 1708   GLUCOSEU NEGATIVE 07/26/2017 1708   HGBUR LARGE (A) 07/26/2017 1708   BILIRUBINUR NEGATIVE 07/26/2017 1708   KETONESUR NEGATIVE 07/26/2017 1708   PROTEINUR NEGATIVE 07/26/2017 1708   NITRITE NEGATIVE 07/26/2017 1708   LEUKOCYTESUR NEGATIVE 07/26/2017 1708   Sepsis Labs: Invalid input(s): PROCALCITONIN, LACTICIDVEN  No results found for this or any previous visit (from the past 240 hour(s)).    Radiology Studies: No results found.   Scheduled Meds: . citalopram  20 mg Oral Daily  . darbepoetin (ARANESP) injection - NON-DIALYSIS  100 mcg Subcutaneous Q Thu-1800  . feeding supplement (ENSURE ENLIVE)  237 mL Oral BID BM  . lactulose  10 g Oral TID  . multivitamin with minerals  1 tablet Oral Daily  . nicotine  21 mg Transdermal Daily  . Tenofovir Alafenamide Fumarate  25 mg Oral q morning - 10a   Continuous Infusions: . DAPTOmycin (CUBICIN)  IV Stopped (08/15/17 1053)     Marzetta Board, MD, PhD [facility] Pager [phone]  If 7PM-7AM, please contact night-coverage [email or web address] Password [facility] 08/15/2017, 11:46 AM

## 2017-08-15 NOTE — Progress Notes (Signed)
Pharmacy Antibiotic Note  Glenn Alvarado is a 52 y.o. male admitted on 07/22/2017 with left shoulder pain.  Found to have MRSA bacteremia and left shoulder septic arthritis.  Afebrile, WBC WNL. SCr continues to trend down- 3.29 today. CK remains stable at 67 today.   Plan: Continue Cubicin 622m IV Q48H through 08/19/17 (~8 mg/kg) Monitor renal fxn, need to check another CK (weekly orders have expired)   Height: 5' 7.99" (172.7 cm) Weight: 147 lb 0.8 oz (66.7 kg) IBW/kg (Calculated) : 68.38  Temp (24hrs), Avg:98.6 F (37 C), Min:98.4 F (36.9 C), Max:98.8 F (37.1 C)  Recent Labs  Lab 08/09/17 0355 08/10/17 0412 08/11/17 1441 08/13/17 0423 08/15/17 0327  WBC  --  6.0  --   --   --   CREATININE 3.92* 3.96* 4.06* 3.76* 3.29*    Estimated Creatinine Clearance: 25.3 mL/min (A) (by C-G formula based on SCr of 3.29 mg/dL (H)).    Allergies  Allergen Reactions  . Codeine Hives  . Acetaminophen Other (See Comments)    Advised not to take d/t liver disease    Vanc 5/20 >>5/21 Cubicin 5/22 >> (6/16)  5/22 CK = 110  5/29 CK = 64 6/5 CK = 59 6/12 CK = 67  BCx OSH: 1/4 MRSA 5/20 BCx - negative 5/20 synovial shoulder fluid: neg 5/22 surg wound cx - negative  Joedy Eickhoff D. Tykel Badie, PharmD, BCPS Clinical Pharmacist [phone] check AMION for all MHillsboronumbers 08/15/2017 10:54 AM

## 2017-08-15 NOTE — Progress Notes (Signed)
Occupational Therapy Treatment Patient Details Name: Glenn Alvarado MRN: [id number] DOB: 03-Aug-1965 Today's Date: 08/15/2017    History of present illness Glenn Alvarado is a 52yo white male who comes to [facility] on 5/22 after 3 weeks shoulder pain, now presenting s/p Left  GHJ I&D. 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    OT comments  Pt is faithful to his L shoulder exercise program outside of therapy. Tolerating place and hold exercises in supine today. Will continue to follow.  Follow Up Recommendations  No OT follow up    Equipment Recommendations  None recommended by OT    Recommendations for Other Services      Precautions / Restrictions Precautions Precautions: None Restrictions Weight Bearing Restrictions: No LUE Weight Bearing: Weight bearing as tolerated       Mobility Bed Mobility                  Transfers                      Balance                                           ADL either performed or assessed with clinical judgement   ADL                                               Vision       Perception     Praxis      Cognition Arousal/Alertness: Awake/alert Behavior During Therapy: WFL for tasks assessed/performed Overall Cognitive Status: History of cognitive impairments - at baseline                                          Exercises Exercises: General Upper Extremity General Exercises - Upper Extremity Shoulder Flexion: AAROM;Self ROM;Left;10 reps;Supine Shoulder ABduction: AAROM;Left;10 reps;Supine Shoulder ADduction: AAROM;Left;10 reps;Supine Other Exercises Other Exercises: ER R shoulder x10 Other Exercises: place and hold FF 5 seconds x 5   Shoulder Instructions       General Comments      Pertinent Vitals/ Pain       Pain Assessment: Faces Faces Pain Scale: Hurts a little  bit Pain Location: L shoulder Pain Descriptors / Indicators: Sore;Grimacing Pain Intervention(s): Repositioned  Home Living                                          Prior Functioning/Environment              Frequency  Min 3X/week        Progress Toward Goals  OT Goals(current goals can now be found in the care plan section)  Progress towards OT goals: Progressing toward goals  Acute Rehab OT Goals Patient Stated Goal: go home OT Goal Formulation: With patient Time For Goal Achievement: 08/27/17 Potential to Achieve Goals: Good  Plan Discharge plan remains appropriate    Co-evaluation  AM-PAC PT "6 Clicks" Daily Activity     Outcome Measure   Help from another person eating meals?: None Help from another person taking care of personal grooming?: None Help from another person toileting, which includes using toliet, bedpan, or urinal?: None Help from another person bathing (including washing, rinsing, drying)?: None Help from another person to put on and taking off regular upper body clothing?: None Help from another person to put on and taking off regular lower body clothing?: None 6 Click Score: 24    End of Session    OT Visit Diagnosis: Pain;Other symptoms and signs involving cognitive function Pain - Right/Left: Left Pain - part of body: Shoulder   Activity Tolerance Patient tolerated treatment well   Patient Left in bed;with call bell/phone within reach;with nursing/sitter in room   Nurse Communication          Time:  -     Charges: OT General Charges $OT Visit: 1 Visit OT Treatments $Therapeutic Exercise: 8-22 mins  08/15/2017 Nestor Lewandowsky, OTR/L Pager: [phone]   Malka So 08/15/2017, 11:15 AM

## 2017-08-15 NOTE — Progress Notes (Signed)
ORTHOPAEDIC PROGRESS NOTE  s/p Procedure(s): IRRIGATION AND DEBRIDEMENT SHOULDER  SUBJECTIVE: No events, doing well.  OBJECTIVE: PE: Left upper extremity: Distal motor and sensory intact, AFE to 80 without much pain  Vitals:   08/15/17 0619 08/15/17 1300  BP: 133/67 (!) 152/82  Pulse: 67 71  Resp: 19 18  Temp: 98.4 F (36.9 C) 98.2 F (36.8 C)  SpO2: 100% 100%     ASSESSMENT: Glenn Alvarado is a 52 y.o. male doing well postoperatively.  We discussed with the patient and his sister his long-term outlook previously.  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  He is not going to be a candidate for arthroscopy and cuff repair or for arthroplasty due to his infection.  His bone quality was poor.  PLAN: Weightbearing: WBAT LUE Insicional and dressing care: open Orthopedic device(s): none Showering: As tolerated VTE prophylaxis: Not indicated in ambulatory upper extremity patient from orthopedic perspective, defer to primary team Pain control: Per primary team  Follow - up plan:patient can follow up as needed with me as he states further care is going to be pursued at [facility]. Contact information:  Weekdays 8-5 Ophelia Charter MD [phone], After hours and holidays please check [email or web address] for group call information for [facility]  Signing off.

## 2017-08-16 LAB — GLUCOSE, CAPILLARY: GLUCOSE-CAPILLARY: 112 mg/dL — AB (ref 65–99)

## 2017-08-16 MED ORDER — OXYCODONE HCL 5 MG PO TABS
5.0000 mg | ORAL_TABLET | Freq: Three times a day (TID) | ORAL | Status: DC | PRN
  Administered 2017-08-16 – 2017-08-17 (×5): 10 mg via ORAL
  Administered 2017-08-18: 5 mg via ORAL
  Administered 2017-08-18 – 2017-08-19 (×3): 10 mg via ORAL
  Filled 2017-08-16 (×9): qty 2

## 2017-08-16 NOTE — Plan of Care (Signed)
  Problem: Clinical Measurements: Goal: Cardiovascular complication will be avoided Outcome: Progressing   Problem: Activity: Goal: Risk for activity intolerance will decrease Outcome: Progressing   Problem: Nutrition: Goal: Adequate nutrition will be maintained Outcome: Progressing   Problem: Coping: Goal: Level of anxiety will decrease Outcome: Progressing

## 2017-08-16 NOTE — Progress Notes (Signed)
PROGRESS NOTE  Glenn Alvarado ZOX:[id number] DOB: 1965-09-13 DOA: 07/22/2017 PCP: Harvie Junior, MD   LOS: 25 days   Brief Narrative / Interim history: 52 year old male with history of coronary artery disease, hypertension, COPD, polysubstance abuse, liver cirrhosis, who was admitted to the hospital on 07/22/2017 from [facility] with MRSA bacteremia and septic arthritis to his left shoulder.  Assessment & Plan: Principal Problem:   MRSA bacteremia Active Problems:   CAD (coronary artery disease)   Depression with anxiety   Essential hypertension   GERD (gastroesophageal reflux disease)   HLD (hyperlipidemia)   Substance abuse ([place])   AKI (acute kidney injury) ([place])   Pancytopenia ([place])   Acute metabolic encephalopathy   Septic joint of left shoulder region ([place])   Left shoulder pain   Abnormal LFTs   Tobacco abuse   COPD (chronic obstructive pulmonary disease) ([name])   Chronic viral hepatitis B with cirrhosis ([name])   Hepatitis C antibody test positive   MRSA bacteremia/left shoulder septic arthritis -Infectious disease was consulted and followed patient while hospitalized.  Initial blood cultures at [facility] prior to admission were positive for MRSA.  TTE showed no vegetation.  Orthopedic surgery was consulted as well as for his left shoulder he is status post irrigation and debridement on 5/22.  Patient currently is on daptomycin, and per infectious disease he is to complete daptomycin on 6/16.  Continue to monitor weekly CK level -afebrile. Home Sunday  Liver cirrhosis/[name]/hepatic encephalopathy -Patient was slightly worsening confusion earlier, his ammonia level was noted to be elevated and his lactulose was restarted.  Appears to back to baseline and appears alert and oriented today -Sometime in the last few years (patient thinks this was in [place], records are not available to me in [facility]) patient was diagnosed with Cirrhosis.  EGD at that time showed  varices.  Albumin low, platelets normal, INR moderately high, Bilirubin normal.  Hepatology notes from [facility] Dr. 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  He was started on entecavir, plans for q77monthfollow up. -MELD score now 26.  Dr. DLoleta Booksc[facility] 6/8.  They recommend fax referral for "asap appointment" to [phone] the time of discharge so that he can be re-evaluated for transplant, given his worsened MELD.  Polysubstance use, prior suicidal ideation -Evaluated by psychiatry during his hospitalization, no further SI, cleared.  Continue SSRI  He denies illicit drug use other than THC.  However, admission H&P here states he was initially admitted encephalopathic with UDS positive for benzodiazepines, amphetamine.  For this reason, the risk of discharge with PICC in place seems to outweigh benefit of discharge for OPAT.  We will maintain in the hospital until IV antibiotics complete Jun 16 and then pull PICC and discharge home.    In the meantime, he is >2 weeks post-op from shoulder debridement.  He complains of widespread daily pain, sometimes in his abdomen, sometimes in his shoulder, sometimes headache. OT evaluated him, report he was able to participate well in ROM exercises.   -Continue oxycodone taper     Oxycodone 5 mg QID prn today and tomorrow     Oxycodone 5 mg TID prn Weds, thurs     Oxycodone 5 mg BID prn Fri, sat     Oxycodone 5 mg once daily prn Sun on day of discharge   Acute kidney injury  and chronic kidney disease stage III -Nephrology was consulted, feel like creatinine is now worsening around 4 and this represents a new baseline for the patient.  They feel like it is from ATN with decreased oral intake/volume depletion. -Cr stable, improving   DVT prophylaxis: SCDs Code Status: Full code Family Communication: No family present at  bedside Disposition Plan: Home Sunday after the last daptomycin dose  Consultants:   Orthopedic surgery  Infectious disease  Nephrology  Procedures:   Left shoulder surgery  Antimicrobials:  Daptomycin   Subjective: - no complaints  Objective: Vitals:   08/15/17 1425 08/15/17 2210 08/16/17 0553 08/16/17 0741  BP: (!) 152/82 136/77 134/71   Pulse: 71 72 76   Resp: 18 19 18    Temp: 98.2 F (36.8 C) 99.3 F (37.4 C) 98.8 F (37.1 C)   TempSrc: Oral Oral Oral   SpO2: 100% 100% 98%   Weight:    73.1 kg (161 lb 2.5 oz)  Height:        Intake/Output Summary (Last 24 hours) at 08/16/2017 1358 Last data filed at 08/16/2017 0930 Gross per 24 hour  Intake 1920 ml  Output -  Net 1920 ml   Filed Weights   08/14/17 0653 08/15/17 0619 08/16/17 0741  Weight: 62.9 kg (138 lb 10.7 oz) 66.7 kg (147 lb 0.8 oz) 73.1 kg (161 lb 2.5 oz)    Examination:  Constitutional: NAD Respiratory: CTA Cardiovascular: RRR  Data Reviewed: I have independently reviewed following labs and imaging studies   CBC: Recent Labs  Lab 08/10/17 0412 08/15/17 1314  WBC 6.0 7.4  HGB 7.7* 8.4*  HCT 23.4* 25.7*  MCV 91.4 89.5  PLT 149* 539*   Basic Metabolic Panel: Recent Labs  Lab 08/10/17 0412 08/11/17 1441 08/13/17 0423 08/15/17 0327  NA 135 132* 130* 130*  K 4.6 4.3 3.8 4.1  CL 103 102 100* 97*  CO2 23 22 23 23   GLUCOSE 102* 142* 115* 92  BUN 58* 59* 59* 61*  CREATININE 3.96* 4.06* 3.76* 3.29*  CALCIUM 8.6* 8.6* 8.3* 8.4*  PHOS 5.0*  --   --   --    GFR: Estimated Creatinine Clearance: 26 mL/min (A) (by C-G formula based on SCr of 3.29 mg/dL (H)). Liver Function Tests: Recent Labs  Lab 08/10/17 0412 08/11/17 1441  AST  --  66*  ALT  --  31  ALKPHOS  --  93  BILITOT  --  0.9  PROT  --  8.1  ALBUMIN 2.5* 2.6*   No results for input(s): LIPASE, AMYLASE in the last 168 hours. Recent Labs  Lab 08/11/17 1441  AMMONIA 76*   Coagulation Profile: Recent Labs  Lab  08/11/17 1441  INR 1.46   Cardiac Enzymes: Recent Labs  Lab 08/15/17 0327  CKTOTAL 67   BNP (last 3 results) No results for input(s): PROBNP in the last 8760 hours. HbA1C: No results for input(s): HGBA1C in the last 72 hours. CBG: Recent Labs  Lab 08/12/17 0800 08/13/17 0753 08/14/17 0814 08/15/17 0745 08/16/17 0743  GLUCAP 85 103* 136* 105* 112*   Lipid Profile: No results for input(s): CHOL, HDL, LDLCALC, TRIG, CHOLHDL, LDLDIRECT in the last 72 hours. Thyroid Function Tests: No results for input(s): TSH, T4TOTAL, FREET4, T3FREE, THYROIDAB in the last 72 hours. Anemia Panel: No results for input(s): VITAMINB12, FOLATE, FERRITIN, TIBC, IRON, RETICCTPCT in the last 72 hours. Urine analysis:    Component Value Date/Time   COLORURINE STRAW (A) 07/26/2017 1708   APPEARANCEUR  CLEAR 07/26/2017 1708   LABSPEC 1.003 (L) 07/26/2017 1708   PHURINE 5.0 07/26/2017 1708   GLUCOSEU NEGATIVE 07/26/2017 1708   HGBUR LARGE (A) 07/26/2017 1708   BILIRUBINUR NEGATIVE 07/26/2017 1708   KETONESUR NEGATIVE 07/26/2017 1708   PROTEINUR NEGATIVE 07/26/2017 1708   NITRITE NEGATIVE 07/26/2017 1708   LEUKOCYTESUR NEGATIVE 07/26/2017 1708   Sepsis Labs: Invalid input(s): PROCALCITONIN, LACTICIDVEN  No results found for this or any previous visit (from the past 240 hour(s)).    Radiology Studies: No results found.   Scheduled Meds: . citalopram  20 mg Oral Daily  . darbepoetin (ARANESP) injection - NON-DIALYSIS  100 mcg Subcutaneous Q Thu-1800  . feeding supplement (ENSURE ENLIVE)  237 mL Oral BID BM  . lactulose  10 g Oral TID  . multivitamin with minerals  1 tablet Oral Daily  . nicotine  21 mg Transdermal Daily  . Tenofovir Alafenamide Fumarate  25 mg Oral q morning - 10a   Continuous Infusions: . DAPTOmycin (CUBICIN)  IV Stopped (08/15/17 1053)     Marzetta Board, MD, PhD [facility] Pager [phone]  If 7PM-7AM, please contact  night-coverage [email or web address] Password [email or web address] 08/16/2017, 1:58 PM

## 2017-08-16 NOTE — Plan of Care (Signed)
  Problem: Pain Managment: Goal: General experience of comfort will improve Outcome: Progressing   

## 2017-08-17 LAB — BASIC METABOLIC PANEL
Anion gap: 5 (ref 5–15)
BUN: 61 mg/dL — AB (ref 6–20)
CALCIUM: 8.1 mg/dL — AB (ref 8.9–10.3)
CHLORIDE: 101 mmol/L (ref 101–111)
CO2: 22 mmol/L (ref 22–32)
CREATININE: 3.29 mg/dL — AB (ref 0.61–1.24)
GFR calc Af Amer: 24 mL/min — ABNORMAL LOW (ref >60)
GFR calc non Af Amer: 20 mL/min — ABNORMAL LOW (ref >60)
Glucose, Bld: 105 mg/dL — ABNORMAL HIGH (ref 65–99)
Potassium: 4 mmol/L (ref 3.5–5.1)
SODIUM: 128 mmol/L — AB (ref 135–145)

## 2017-08-17 LAB — GLUCOSE, CAPILLARY: GLUCOSE-CAPILLARY: 102 mg/dL — AB (ref 65–99)

## 2017-08-17 NOTE — Progress Notes (Signed)
Occupational Therapy Treatment Patient Details Name: Glenn Alvarado MRN: [id number] DOB: 1965-04-25 Today's Date: 08/17/2017    History of present illness Glenn Alvarado is a 52yo white male who comes to [facility] on 5/22 after 3 weeks shoulder pain, now presenting s/p Left  GHJ I&D. 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    OT comments  Pt with increased pain in L shoulder. Reports exercising it 15 minutes x 4/day and doing wall walking. Educated pt to stick to AAROM exercises 2-3 x a day and 10 reps as he has been performing in therapy sessions as he is over working his shoulder resulting in pain. Pt verbalizing understanding.  Follow Up Recommendations  No OT follow up    Equipment Recommendations  None recommended by OT    Recommendations for Other Services      Precautions / Restrictions Precautions Precautions: None Restrictions LUE Weight Bearing: Weight bearing as tolerated       Mobility Bed Mobility                  Transfers Overall transfer level: Independent Equipment used: None                  Balance                                           ADL either performed or assessed with clinical judgement   ADL                                               Vision       Perception     Praxis      Cognition Arousal/Alertness: Awake/alert Behavior During Therapy: WFL for tasks assessed/performed Overall Cognitive Status: History of cognitive impairments - at baseline                                          Exercises Exercises: General Upper Extremity General Exercises - Upper Extremity Shoulder Flexion: AAROM;Self ROM;Left;Supine;Standing;5 reps Shoulder ABduction: AAROM;Left;Supine;5 reps Shoulder Horizontal ADduction: AAROM;Right;Supine;5 reps Other Exercises Other Exercises: ER R shoulder x5    Shoulder Instructions       General Comments      Pertinent Vitals/ Pain       Pain Assessment: Faces Faces Pain Scale: Hurts even more Pain Location: L shoulder Pain Descriptors / Indicators: Sore;Grimacing Pain Intervention(s): Monitored during session  Home Living                                          Prior Functioning/Environment              Frequency  Min 2X/week        Progress Toward Goals  OT Goals(current goals can now be found in the care plan section)  Progress towards OT goals: Progressing toward goals  Acute Rehab OT Goals Patient Stated Goal: go home OT Goal Formulation: With patient Time For Goal Achievement: 08/27/17 Potential to Achieve Goals: Good  Plan Discharge plan remains appropriate;Frequency needs to be updated    Co-evaluation                 AM-PAC PT "6 Clicks" Daily Activity     Outcome Measure   Help from another person eating meals?: None Help from another person taking care of personal grooming?: None Help from another person toileting, which includes using toliet, bedpan, or urinal?: None Help from another person bathing (including washing, rinsing, drying)?: None Help from another person to put on and taking off regular upper body clothing?: None Help from another person to put on and taking off regular lower body clothing?: None 6 Click Score: 24    End of Session    Pain - Right/Left: Left Pain - part of body: Shoulder   Activity Tolerance Patient tolerated treatment well   Patient Left in bed;with call bell/phone within reach   Nurse Communication          Time: 7897-8478 OT Time Calculation (min): 15 min  Charges: OT General Charges $OT Visit: 1 Visit OT Treatments $Therapeutic Exercise: 8-22 mins  08/17/2017 Nestor Lewandowsky, OTR/L Pager: [phone]   Malka So 08/17/2017, 1:09 PM

## 2017-08-17 NOTE — Plan of Care (Signed)
  Problem: Pain Managment: Goal: General experience of comfort will improve Outcome: Progressing   

## 2017-08-17 NOTE — Progress Notes (Signed)
PROGRESS NOTE  Kester Stimpson WER:[id number] DOB: 02-13-1966 DOA: 07/22/2017 PCP: Harvie Junior, MD   LOS: 26 days   Brief Narrative / Interim history: 52 year old male with history of coronary artery disease, hypertension, COPD, polysubstance abuse, liver cirrhosis, who was admitted to the hospital on 07/22/2017 from [facility] with MRSA bacteremia and septic arthritis to his left shoulder.  Assessment & Plan: Principal Problem:   MRSA bacteremia Active Problems:   CAD (coronary artery disease)   Depression with anxiety   Essential hypertension   GERD (gastroesophageal reflux disease)   HLD (hyperlipidemia)   Substance abuse ([place])   AKI (acute kidney injury) ([place])   Pancytopenia ([place])   Acute metabolic encephalopathy   Septic joint of left shoulder region ([place])   Left shoulder pain   Abnormal LFTs   Tobacco abuse   COPD (chronic obstructive pulmonary disease) ([name])   Chronic viral hepatitis B with cirrhosis ([name])   Hepatitis C antibody test positive   MRSA bacteremia/left shoulder septic arthritis -Infectious disease was consulted and followed patient while hospitalized.  Initial blood cultures at [facility] prior to admission were positive for MRSA.  TTE showed no vegetation.  Orthopedic surgery was consulted as well as for his left shoulder he is status post irrigation and debridement on 5/22.  Patient currently is on daptomycin, and per infectious disease he is to complete daptomycin on 6/16.  Continue to monitor weekly CK level -Remains afebrile  Liver cirrhosis/[name]/hepatic encephalopathy -Patient was slightly worsening confusion earlier, his ammonia level was noted to be elevated and his lactulose was restarted.  Appears to back to baseline and appears alert and oriented today -Sometime in the last few years (patient thinks this was in [place], records are not available to me in [facility]) patient was diagnosed with Cirrhosis.  EGD at that time showed  varices.  Albumin low, platelets normal, INR moderately high, Bilirubin normal.  Hepatology notes from [facility] Dr. 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  He was started on entecavir, plans for q67monthfollow up. -MELD score now 26.  Dr. DLoleta Booksc[facility] with on[facility]call on 6/8.  They recommend fax referral for "asap appointment" to ([phone] the time of discharge so that he can be re-evaluated for transplant, given his worsened MELD.  Polysubstance use, prior suicidal ideation -Evaluated by psychiatry during his hospitalization, no further SI, cleared.  Continue SSRI  He denies illicit drug use other than THC.  However, admission H&P here states he was initially admitted encephalopathic with UDS positive for benzodiazepines, amphetamine.  For this reason, the risk of discharge with PICC in place seems to outweigh benefit of discharge for OPAT.  We will maintain in the hospital until IV antibiotics complete Jun 16 and then pull PICC and discharge home.    In the meantime, he is >2 weeks post-op from shoulder debridement.  He complains of widespread daily pain, sometimes in his abdomen, sometimes in his shoulder, sometimes headache. OT evaluated him, report he was able to participate well in ROM exercises.    Acute kidney injury and chronic kidney disease stage III -Nephrology was consulted, feel like creatinine is now worsening around 4 and this represents a new baseline for the patient.  They feel like it is from ATN with decreased oral intake/volume depletion. -Cr stable, improving   DVT prophylaxis: SCDs Code Status: Full code Family Communication: No  family present at bedside Disposition Plan: Home Sunday after the last daptomycin dose  Consultants:   Orthopedic surgery  Infectious disease  Nephrology  Procedures:   Left shoulder  surgery  Antimicrobials:  Daptomycin   Subjective: -No complaints  Objective: Vitals:   08/16/17 1300 08/16/17 2117 08/17/17 0619 08/17/17 1000  BP: (!) 142/82 137/84 137/81   Pulse: 77 79 67   Resp: 18 17 18    Temp: 98.4 F (36.9 C) 99 F (37.2 C) (!) 97 F (36.1 C)   TempSrc: Oral Oral Oral   SpO2: 100% 100% 99%   Weight:    66.8 kg (147 lb 4.3 oz)  Height:        Intake/Output Summary (Last 24 hours) at 08/17/2017 1227 Last data filed at 08/16/2017 2117 Gross per 24 hour  Intake 1675 ml  Output -  Net 1675 ml   Filed Weights   08/15/17 0619 08/16/17 0741 08/17/17 1000  Weight: 66.7 kg (147 lb 0.8 oz) 73.1 kg (161 lb 2.5 oz) 66.8 kg (147 lb 4.3 oz)    Examination:  Constitutional: NAD Respiratory: CTA Cardiovascular: RRR  Data Reviewed: I have independently reviewed following labs and imaging studies   CBC: Recent Labs  Lab 08/15/17 1314  WBC 7.4  HGB 8.4*  HCT 25.7*  MCV 89.5  PLT 834*   Basic Metabolic Panel: Recent Labs  Lab 08/11/17 1441 08/13/17 0423 08/15/17 0327 08/17/17 0453  NA 132* 130* 130* 128*  K 4.3 3.8 4.1 4.0  CL 102 100* 97* 101  CO2 22 23 23 22   GLUCOSE 142* 115* 92 105*  BUN 59* 59* 61* 61*  CREATININE 4.06* 3.76* 3.29* 3.29*  CALCIUM 8.6* 8.3* 8.4* 8.1*   GFR: Estimated Creatinine Clearance: 25.4 mL/min (A) (by C-G formula based on SCr of 3.29 mg/dL (H)). Liver Function Tests: Recent Labs  Lab 08/11/17 1441  AST 66*  ALT 31  ALKPHOS 93  BILITOT 0.9  PROT 8.1  ALBUMIN 2.6*   No results for input(s): LIPASE, AMYLASE in the last 168 hours. Recent Labs  Lab 08/11/17 1441  AMMONIA 76*   Coagulation Profile: Recent Labs  Lab 08/11/17 1441  INR 1.46   Cardiac Enzymes: Recent Labs  Lab 08/15/17 0327  CKTOTAL 67   BNP (last 3 results) No results for input(s): PROBNP in the last 8760 hours. HbA1C: No results for input(s): HGBA1C in the last 72 hours. CBG: Recent Labs  Lab 08/13/17 0753  08/14/17 0814 08/15/17 0745 08/16/17 0743 08/17/17 0744  GLUCAP 103* 136* 105* 112* 102*   Lipid Profile: No results for input(s): CHOL, HDL, LDLCALC, TRIG, CHOLHDL, LDLDIRECT in the last 72 hours. Thyroid Function Tests: No results for input(s): TSH, T4TOTAL, FREET4, T3FREE, THYROIDAB in the last 72 hours. Anemia Panel: No results for input(s): VITAMINB12, FOLATE, FERRITIN, TIBC, IRON, RETICCTPCT in the last 72 hours. Urine analysis:    Component Value Date/Time   COLORURINE STRAW (A) 07/26/2017 1708   APPEARANCEUR CLEAR 07/26/2017 1708   LABSPEC 1.003 (L) 07/26/2017 1708   PHURINE 5.0 07/26/2017 1708   GLUCOSEU NEGATIVE 07/26/2017 1708   HGBUR LARGE (A) 07/26/2017 1708   BILIRUBINUR NEGATIVE 07/26/2017 1708   KETONESUR NEGATIVE 07/26/2017 1708   PROTEINUR NEGATIVE 07/26/2017 1708   NITRITE NEGATIVE 07/26/2017 1708   LEUKOCYTESUR NEGATIVE 07/26/2017 1708   Sepsis Labs: Invalid input(s): PROCALCITONIN, LACTICIDVEN  No results found for this or any previous visit (from the past 240 hour(s)).    Radiology Studies: No results found.  Scheduled Meds: . citalopram  20 mg Oral Daily  . darbepoetin (ARANESP) injection - NON-DIALYSIS  100 mcg Subcutaneous Q Thu-1800  . feeding supplement (ENSURE ENLIVE)  237 mL Oral BID BM  . lactulose  10 g Oral TID  . multivitamin with minerals  1 tablet Oral Daily  . nicotine  21 mg Transdermal Daily  . Tenofovir Alafenamide Fumarate  25 mg Oral q morning - 10a   Continuous Infusions: . DAPTOmycin (CUBICIN)  IV Stopped (08/17/17 1114)     Marzetta Board, MD, PhD [facility] Pager [phone]  If 7PM-7AM, please contact night-coverage [email or web address] Password [facility] 08/17/2017, 12:27 PM

## 2017-08-17 NOTE — Social Work (Addendum)
CSW spoke with pt at bedside, reintroduced self and that CSW was checking in with pt as his discharge will be this Sunday when abx are complete. Pt states his stepfather will pick him up. Per MD note on 6/10- "patient's step father says that the patient CAN NOT stay with them when he leaves." CSW asked permission to call stepfather and talk about discharge plans. CSW did make pt aware that his stepfather had indicated to staff that family may not be willing to support pt at discharge with a place to stay.   Pt states he also may be able to stay with his brother and provided that number. Glenn Alvarado [phone]. CSW also has permission to call brother.   CSW again asked if there was anyone else other than family that would be able to provide support or shelter to pt when he is discharged in the event that pt family members are not going to provide further shelter at discharge. Pt stated "I'll find someone."   1:57pm - CSW has attempted x1 to reach step father, no answer and no voice mail set up. Will attempt again this afternoon.   2:53pm- CSW spoke with pt step father, he will be willing to pick up pt on Sunday but "I don't know where he's going to, maybe his brothers." CSW confirmed that this is what pt was planning but CSW was going to confirm with pt brother Glenn Alvarado.   3:45pm- CSW spoke with pt brothers girlfriend Glenn Alvarado at above number. She states that pt step father will be able to drop pt off at her and Johns home at discharge. Glenn Alvarado states that she and pt have discussed that he will be able to stay at their home as long as "he does not use drugs and respects their home." CSW met with pt and confirmed with him the plan.   CSW signing off. Please consult if any additional needs arise.  Alexander Mt, [place] Work [phone]

## 2017-08-18 LAB — BASIC METABOLIC PANEL
ANION GAP: 8 (ref 5–15)
BUN: 58 mg/dL — ABNORMAL HIGH (ref 6–20)
CALCIUM: 8.4 mg/dL — AB (ref 8.9–10.3)
CO2: 23 mmol/L (ref 22–32)
Chloride: 99 mmol/L — ABNORMAL LOW (ref 101–111)
Creatinine, Ser: 3.21 mg/dL — ABNORMAL HIGH (ref 0.61–1.24)
GFR, EST AFRICAN AMERICAN: 24 mL/min — AB (ref >60)
GFR, EST NON AFRICAN AMERICAN: 21 mL/min — AB (ref >60)
Glucose, Bld: 97 mg/dL (ref 65–99)
Potassium: 4 mmol/L (ref 3.5–5.1)
Sodium: 130 mmol/L — ABNORMAL LOW (ref 135–145)

## 2017-08-18 LAB — CBC
HCT: 26 % — ABNORMAL LOW (ref 39.0–52.0)
Hemoglobin: 8.5 g/dL — ABNORMAL LOW (ref 13.0–17.0)
MCH: 29.1 pg (ref 26.0–34.0)
MCHC: 32.7 g/dL (ref 30.0–36.0)
MCV: 89 fL (ref 78.0–100.0)
PLATELETS: 135 10*3/uL — AB (ref 150–400)
RBC: 2.92 MIL/uL — ABNORMAL LOW (ref 4.22–5.81)
RDW: 14.5 % (ref 11.5–15.5)
WBC: 7.8 10*3/uL (ref 4.0–10.5)

## 2017-08-18 LAB — GLUCOSE, CAPILLARY: Glucose-Capillary: 95 mg/dL (ref 65–99)

## 2017-08-18 NOTE — Progress Notes (Signed)
PROGRESS NOTE  Glenn Alvarado URK:[id number] DOB: [birth year] DOA: 07/22/2017 PCP: Harvie Junior, MD   LOS: 27 days   Brief Narrative / Interim history: 52 year old male with history of coronary artery disease, hypertension, COPD, polysubstance abuse, liver cirrhosis, who was admitted to the hospital on 07/22/2017 from [facility] with MRSA bacteremia and septic arthritis to his left shoulder.  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  Initial blood cultures at [facility] prior to admission were positive for MRSA.  TTE showed no vegetation.  Orthopedic surgery was consulted as well as for his left shoulder he is status post irrigation and debridement on 5/22.  Patient currently is on daptomycin, and per infectious disease he is to complete daptomycin on 6/16.  Continue to monitor weekly CK level -Remains afebrile  Liver cirrhosis/[name]/hepatic encephalopathy -Patient was slightly worsening confusion earlier, his ammonia level was noted to be elevated and his lactulose was restarted.  Appears to back to baseline and appears alert and oriented today -Sometime in the last few years (patient thinks this was in [place], records are not available to me in [facility]) patient was diagnosed with Cirrhosis.  EGD at that time showed  varices.  Albumin low, platelets normal, INR moderately high, Bilirubin normal.  Hepatology notes from [facility] Dr. 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  He was started on entecavir, plans for q29monthfollow up. -MELD score now 26.  [facility] DLoleta [facility].  They recommend fax referral for "asap appointment" to ([phone] the time of discharge so that he can be re-evaluated for transplant, given his worsened MELD.  Polysubstance use, prior suicidal ideation -Evaluated by psychiatry during his hospitalization, no further SI, cleared.  Continue SSRI  He denies illicit drug use other than THC.  However, admission H&P here states he was initially admitted encephalopathic with UDS positive for benzodiazepines, amphetamine.  For this reason, the risk of discharge with PICC in place seems to outweigh benefit of discharge for OPAT.  We will maintain in the hospital until IV antibiotics complete Jun 16 and then pull PICC and discharge home.    In the meantime, he is >2 weeks post-op from shoulder debridement.  He complains of widespread daily pain, sometimes in his abdomen, sometimes in his shoulder, sometimes headache. OT evaluated him, report he was able to participate well in ROM exercises.    Acute kidney injury and chronic kidney disease stage III -Nephrology was consulted, feel like creatinine is now worsening around 4 and this represents a new baseline for the patient.  They feel like it is from ATN with decreased oral intake/volume depletion. -Cr stable, improving   DVT prophylaxis: SCDs Code Status: Full code Family Communication: No  family present at bedside Disposition Plan: Home Sunday after the last daptomycin dose  Consultants:   Orthopedic surgery  Infectious disease  Nephrology  Procedures:   Left shoulder  surgery  Antimicrobials:  Daptomycin   Subjective: -No complaints  Objective: Vitals:   08/17/17 1341 08/17/17 2130 08/18/17 0500 08/18/17 0534  BP: 138/80 (!) 150/79  (!) 147/88  Pulse: 70 73  73  Resp: 18 17  18   Temp: 97.9 F (36.6 C) 98.6 F (37 C)  99 F (37.2 C)  TempSrc: Oral Oral  Oral  SpO2: 100% 100%  97%  Weight:   65.6 kg (144 lb 10 oz)   Height:        Intake/Output Summary (Last 24 hours) at 08/18/2017 1307 Last data filed at 08/18/2017 0353 Gross per 24 hour  Intake 1164 ml  Output -  Net 1164 ml   Filed Weights   08/16/17 0741 08/17/17 1000 08/18/17 0500  Weight: 73.1 kg (161 lb 2.5 oz) 66.8 kg (147 lb 4.3 oz) 65.6 kg (144 lb 10 oz)    Examination:  Constitutional: NAD  Data Reviewed: I have independently reviewed following labs and imaging studies   CBC: Recent Labs  Lab 08/15/17 1314 08/18/17 0337  WBC 7.4 7.8  HGB 8.4* 8.5*  HCT 25.7* 26.0*  MCV 89.5 89.0  PLT 139* 829*   Basic Metabolic Panel: Recent Labs  Lab 08/11/17 1441 08/13/17 0423 08/15/17 0327 08/17/17 0453 08/18/17 0337  NA 132* 130* 130* 128* 130*  K 4.3 3.8 4.1 4.0 4.0  CL 102 100* 97* 101 99*  CO2 22 23 23 22 23   GLUCOSE 142* 115* 92 105* 97  BUN 59* 59* 61* 61* 58*  CREATININE 4.06* 3.76* 3.29* 3.29* 3.21*  CALCIUM 8.6* 8.3* 8.4* 8.1* 8.4*   GFR: Estimated Creatinine Clearance: 25.5 mL/min (A) (by C-G formula based on SCr of 3.21 mg/dL (H)). Liver Function Tests: Recent Labs  Lab 08/11/17 1441  AST 66*  ALT 31  ALKPHOS 93  BILITOT 0.9  PROT 8.1  ALBUMIN 2.6*   No results for input(s): LIPASE, AMYLASE in the last 168 hours. Recent Labs  Lab 08/11/17 1441  AMMONIA 76*   Coagulation Profile: Recent Labs  Lab 08/11/17 1441  INR 1.46   Cardiac Enzymes: Recent Labs  Lab 08/15/17 0327  CKTOTAL 67   BNP (last 3 results) No results for input(s): PROBNP in the last 8760 hours. HbA1C: No results for input(s): HGBA1C in the last 72  hours. CBG: Recent Labs  Lab 08/14/17 0814 08/15/17 0745 08/16/17 0743 08/17/17 0744 08/18/17 0751  GLUCAP 136* 105* 112* 102* 95   Lipid Profile: No results for input(s): CHOL, HDL, LDLCALC, TRIG, CHOLHDL, LDLDIRECT in the last 72 hours. Thyroid Function Tests: No results for input(s): TSH, T4TOTAL, FREET4, T3FREE, THYROIDAB in the last 72 hours. Anemia Panel: No results for input(s): VITAMINB12, FOLATE, FERRITIN, TIBC, IRON, RETICCTPCT in the last 72 hours. Urine analysis:    Component Value Date/Time   COLORURINE STRAW (A) 07/26/2017 1708   APPEARANCEUR CLEAR 07/26/2017 1708   LABSPEC 1.003 (L) 07/26/2017 1708   PHURINE 5.0 07/26/2017 1708   GLUCOSEU NEGATIVE 07/26/2017 1708   HGBUR LARGE (A) 07/26/2017 1708   BILIRUBINUR NEGATIVE 07/26/2017 1708   KETONESUR NEGATIVE 07/26/2017 1708   PROTEINUR NEGATIVE 07/26/2017 1708   NITRITE NEGATIVE 07/26/2017 1708   LEUKOCYTESUR NEGATIVE 07/26/2017 1708   Sepsis Labs: Invalid input(s): PROCALCITONIN, LACTICIDVEN  No results found for this or any previous visit (from  the past 240 hour(s)).    Radiology Studies: No results found.   Scheduled Meds: . citalopram  20 mg Oral Daily  . darbepoetin (ARANESP) injection - NON-DIALYSIS  100 mcg Subcutaneous Q Thu-1800  . feeding supplement (ENSURE ENLIVE)  237 mL Oral BID BM  . lactulose  10 g Oral TID  . multivitamin with minerals  1 tablet Oral Daily  . nicotine  21 mg Transdermal Daily  . Tenofovir Alafenamide Fumarate  25 mg Oral q morning - 10a   Continuous Infusions: . DAPTOmycin (CUBICIN)  IV Stopped (08/17/17 1114)     Marzetta Board, MD, PhD [facility] Pager [phone]  If 7PM-7AM, please contact night-coverage [email or web address] Password [email or web address] 08/18/2017, 1:07 PM

## 2017-08-19 LAB — GLUCOSE, CAPILLARY: GLUCOSE-CAPILLARY: 105 mg/dL — AB (ref 65–99)

## 2017-08-19 MED ORDER — LIDOCAINE HCL (PF) 1 % IJ SOLN: 5.0000 mL | Freq: Once | INTRAMUSCULAR | Status: DC

## 2017-08-19 MED ORDER — LIDOCAINE HCL (PF) 1 % IJ SOLN
INTRAMUSCULAR | Status: AC
  Filled 2017-08-19: qty 30

## 2017-08-19 MED ORDER — LIDOCAINE HCL 2 % IJ SOLN
5.0000 mL | Freq: Once | INTRAMUSCULAR | Status: DC
  Filled 2017-08-19: qty 10

## 2017-08-19 MED ORDER — OXYCODONE HCL 5 MG PO TABS: 5.0000 mg | ORAL_TABLET | Freq: Three times a day (TID) | ORAL | 0 refills | Status: DC | PRN

## 2017-08-19 MED ORDER — NICOTINE 21 MG/24HR TD PT24: 21.0000 mg | MEDICATED_PATCH | Freq: Every day | TRANSDERMAL | 0 refills | Status: DC

## 2017-08-19 NOTE — Discharge Summary (Signed)
Physician Discharge Summary  Glenn Alvarado GXQ:[id number] DOB: 03-Jun-1965 DOA: 07/22/2017  PCP: Harvie Junior, MD  Admit date: 07/22/2017 Discharge date: 08/19/2017  Admitted From: home Disposition:  home  Recommendations for Outpatient Follow-up:  1. Follow up with PCP in 1-2 weeks  Home Health: none  Equipment/Devices: none  Discharge Condition: stable CODE STATUS: Full code Diet recommendation: regular  HPI: Per Dr. Blaine Hamper, Glenn Alvarado is a 52 y.o. male with medical history significant of hypertension, hyperlipidemia COPD, GERD, depression with anxiety, CAD, substance abuse, tobacco abuse, who presents with left shoulder pain. Patient is transfered from [facility]. Pt was initially admitted to [facility] on 07/19/2017 due to altered mental status.  That was most likely due to drug abuse.  Patient had negative CT head.  And found to have possibly UDS for amphetamine and benzodiazepines.  Patient's altered mental status improved quickly.  After he woke up, patient complains of left shoulder pain.  He also had fver and leukocytosis. CT scan finding is concerning for osteomyelitis of the glenohumeral joint. Orthopedics was consulted there, recommending transfer to tertiary care center with ID, plastics as well as trauma available for wound washout.   Hospital Course: MRSA bacteremia/left shoulder septic arthritis -Infectious disease was consulted and followed patient while hospitalized.  Initial blood cultures at [facility] prior to admission were positive for MRSA.  TTE showed no vegetation.  Orthopedic surgery was consulted as well as for his left shoulder he is status post irrigation and debridement on 5/22.  Patient was placed on daptomycin. He was not a PICC line candidate due to history of polysubstance abuse. Per infectious disease recommendations he has remained hospitalized and has completed his antibiotics on 6/16.   Liver cirrhosis/[name]/hepatic encephalopathy  -Sometime in the last few years (patient thinks this was in [place], records are not available to me in [facility]) patient was diagnosed with Cirrhosis. 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 Albumin low, platelets normal, INR moderately high, Bilirubin normal. Hepatology notes from [facility] Dr. 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 He was started on entecavir, plans for q67monthfollow up. MELD in the 20s here. Dr. DLoleta [facility]. They recommend fax referral for "asap appointment" to ([phone] the time of discharge so that he can be re-evaluated for transplant, given his worsened MELD. Polysubstance use, prior suicidal ideation -Evaluated by psychiatry during his hospitalization, no further SI, cleared.  Continue SSRI  Acute kidney injury and chronic kidney disease stage III -Nephrology was consulted, feel like creatinine is now worsening around 4 and this represents a new baseline for the patient.  They feel like it is from ATN with decreased oral intake/volume depletion. Cr stable, improving. Outpatient follow up  [facility] Echocardiogram 5/18 CONCLUSIONS  -----------  1. There is normal global left ventricular contractility.  2. Overall left ventricular systolic function is normal with, an EF between 65 - 70 %.  3. The right ventricular systolic function is normal.  4. There is mild aortic valve sclerosis.  5. Mild tricuspid regurgitation present.   Left upper extremity CT 5/19 IMPRESSION:  1. Permeative appearance of the humeral head and glenoid with scattered cortical erosion, most prominently involving the greater tuberosity. Given associated moderate glenohumeral joint effusion, findings are most concerning for septic arthritis with osteomyelitis. Recommend further evaluation  with shoulder  aspiration or surgical drainage.   CT head 5/16 No acute intracranial pathology.   Chest x-ray 07/19/17 Ill-defined density seen in right midlung which is slightly smaller compared to prior exam. This may represent focal inflammation or atelectasis, but underlying neoplasm cannot be excluded. Followup PA and lateral chest X-ray is recommended in 3-4 weeks following trial  of antibiotic therapy to ensure resolution and exclude underlying malignancy.   CT abdomen urogram 5/16 Bilateral nonobstructive nephrolithiasis. No hydronephrosis or renal obstruction is noted.  Findings consistent with hepatic cirrhosis.  Stable large solitary gallstone is noted. Gallbladder distention is noted without inflammatory findings.  Microbiology Blood cultures 5/16 - 1/2 MRSA +, sensitive to Bactrim, no data on Clindamycin or Tetracycline Cultures became positive on 5/17   Repeat blood cultures were ordered and patient was started on Vancomycin.   Repeat blood cultures on 5/17 have remained negative and are final.   Discharge Diagnoses:  Principal Problem:   MRSA bacteremia Active Problems:   CAD (coronary artery disease)   Depression with anxiety   Essential hypertension   GERD (gastroesophageal reflux disease)   HLD (hyperlipidemia)   Substance abuse ([place])   AKI (acute kidney injury) ([place])   Pancytopenia ([place])   Acute metabolic encephalopathy   Septic joint of left shoulder region [facility])   Left shoulder pain   Abnormal LFTs   Tobacco abuse   COPD (chronic obstructive pulmonary disease) ([name])   Chronic viral hepatitis B with cirrhosis ([name])   Hepatitis C antibody test positive   Discharge Instructions   Allergies as of 08/19/2017      Reactions   Codeine Hives   Acetaminophen Other (See Comments)   Advised not to take d/t liver disease      Medication List    STOP taking these medications   ALPRAZolam 0.5 MG tablet Commonly known as:  XANAX     TAKE these medications     lactulose 10 GM/15ML solution Commonly known as:  CHRONULAC Take 15 mLs by mouth 3 (three) times daily. Titrate in order to achieve 3-4 bowel movements per day.   nicotine 21 mg/24hr patch Commonly known as:  NICODERM CQ - dosed in mg/24 hours Place 1 patch (21 mg total) onto the skin daily.   oxyCODONE 5 MG immediate release tablet Commonly known as:  Oxy IR/ROXICODONE Take 1-2 tablets (5-10 mg total) by mouth every 8 (eight) hours as needed. What changed:    how much to take  when to take this   rifaximin 550 MG Tabs tablet Commonly known as:  XIFAXAN Take 550 mg by mouth 2 (two) times daily.   Tenofovir Alafenamide Fumarate 25 MG Tabs Take 1 tablet (25 mg total) by mouth daily.      Follow-up Information    Hiram Gash, MD Follow up in 2 week(s).   Specialty:  Orthopedic Surgery Contact information: [address] [place] [address] [phone]        Elmarie Shiley, MD Follow up on 09/18/2017.   Specialty:  Nephrology Why:  Appointment is at 9:45 AM and you will get labs 1 week prior to visit. Contact information: [place] [address] [phone]           Consultations:  Orthopedic surgery  Infectious disease  Nephrology  Procedures/Studies:  Left shoulder surgery   US Renal  Result Date: 07/25/2017 CLINICAL DATA:  Elevated creatinine EXAM: RENAL / URINARY TRACT ULTRASOUND COMPLETE COMPARISON:  CT 07/19/2017 FINDINGS:  Right Kidney: Length: 13.8 cm.  Increased echotexture.  No mass or hydronephrosis. Left Kidney: Length: 12.9 cm.  Increased echotexture.  No mass or hydronephrosis. Bladder: Appears normal for degree of bladder distention. IMPRESSION: Increased echotexture within the kidneys bilaterally suggesting chronic medical renal disease. Electronically Signed   By: Rolm Baptise M.D.   On: 07/25/2017 18:28   Mr Shoulder Left Wo Contrast  Result Date: 07/24/2017 CLINICAL DATA:  Left shoulder pain and leukocytosis  EXAM: MRI OF THE LEFT SHOULDER WITHOUT CONTRAST TECHNIQUE: Multiplanar, multisequence MR imaging of the shoulder was performed. No intravenous contrast was administered. COMPARISON:  CT scan 07/22/2017 FINDINGS: Diffuse abnormal T1 and T2 signal intensity in the humerus and scapula worrisome for osteomyelitis. Cystic appearing change in the greater tuberosity could be a bone abscess. There is a joint effusion and severe synovitis with findings suspicious for septic arthritis. There is also marked edema like signal abnormality in the supraspinatus, infraspinatus and teres minor muscles worrisome for myofasciitis without definite pyomyositis. Significant rotator cuff tendinopathy/tendinosis. There is also an articular surface tear involving the supraspinatus tendon and interstitial tears involving the infraspinatus tendon. Findings suspicious for septic subacromial/subdeltoid bursitis. IMPRESSION: MR findings highly suspicious for septic arthritis, osteomyelitis and septic bursitis. There is also myofasciitis without definite findings for pyomyositis. These results will be called to the ordering clinician or representative by the Radiologist Assistant, and communication documented in the PACS or zVision Dashboard. Electronically Signed   By: Marijo Sanes M.D.   On: 07/24/2017 08:54   Ir Fluoro Guide Cv Line Right  Result Date: 08/01/2017 INDICATION: Poor venous access. Chronic renal insufficiency. Request made for placement of a jugular approach tunneled central venous catheter for durable intravenous access for prolonged antibiotic administration. EXAM: TUNNELED CENTRAL VENOUS CATHETER PLACEMENT WITH ULTRASOUND AND FLUOROSCOPIC GUIDANCE MEDICATIONS: Patient is currently admitted to the hospital and receiving intravenous antibiotics. The antibiotic was given in an appropriate time interval prior to skin puncture. ANESTHESIA/SEDATION: None FLUOROSCOPY TIME:  18 seconds (0.8 mGy) COMPLICATIONS: None immediate.  PROCEDURE: Informed written consent was obtained from the patient after a discussion of the risks, benefits, and alternatives to treatment. Questions regarding the procedure were encouraged and answered. The right neck and chest were prepped with chlorhexidine in a sterile fashion, and a sterile drape was applied covering the operative field. Maximum barrier sterile technique with sterile gowns and gloves were used for the procedure. A timeout was performed prior to the initiation of the procedure. After the overlying soft tissues were anesthetized, a small venotomy incision was created and a micropuncture kit was utilized to access the internal jugular vein. Real-time ultrasound guidance was utilized for vascular access including the acquisition of a permanent ultrasound image documenting patency of the accessed vessel. The microwire was utilized to measure appropriate catheter length. The micropuncture sheath was exchanged for a peel-away sheath over a guidewire. A 5 French single lumen tunneled central venous catheter measuring 27 cm was tunneled in a retrograde fashion from the anterior chest wall to the venotomy incision. The catheter was then placed through the peel-away sheath with tip ultimately positioned at the superior caval-atrial junction. Final catheter positioning was confirmed and documented with a spot radiographic image. The catheter aspirates and flushes normally. The catheter was flushed with appropriate volume heparin dwells. The catheter exit site was secured with a 0-Prolene retention suture. The venotomy incision was closed with Dermabond and Steri-strips. Dressings were applied. The patient tolerated the procedure well without immediate post procedural complication. FINDINGS: After catheter  placement, the tip lies within the superior cavoatrial junction. The catheter aspirates and flushes normally and is ready for immediate use. IMPRESSION: Successful placement of 27 cm single lumen tunneled  central venous catheter via the right internal jugular vein with tip terminating at the superior caval atrial junction. The catheter is ready for immediate use. Electronically Signed   By: Sandi Mariscal M.D.   On: 08/01/2017 10:59   Ir US Guide Vasc Access Right  Result Date: 08/01/2017 INDICATION: Poor venous access. Chronic renal insufficiency. Request made for placement of a jugular approach tunneled central venous catheter for durable intravenous access for prolonged antibiotic administration. EXAM: TUNNELED CENTRAL VENOUS CATHETER PLACEMENT WITH ULTRASOUND AND FLUOROSCOPIC GUIDANCE MEDICATIONS: Patient is currently admitted to the hospital and receiving intravenous antibiotics. The antibiotic was given in an appropriate time interval prior to skin puncture. ANESTHESIA/SEDATION: None FLUOROSCOPY TIME:  18 seconds (0.8 mGy) COMPLICATIONS: None immediate. PROCEDURE: Informed written consent was obtained from the patient after a discussion of the risks, benefits, and alternatives to treatment. Questions regarding the procedure were encouraged and answered. The right neck and chest were prepped with chlorhexidine in a sterile fashion, and a sterile drape was applied covering the operative field. Maximum barrier sterile technique with sterile gowns and gloves were used for the procedure. A timeout was performed prior to the initiation of the procedure. After the overlying soft tissues were anesthetized, a small venotomy incision was created and a micropuncture kit was utilized to access the internal jugular vein. Real-time ultrasound guidance was utilized for vascular access including the acquisition of a permanent ultrasound image documenting patency of the accessed vessel. The microwire was utilized to measure appropriate catheter length. The micropuncture sheath was exchanged for a peel-away sheath over a guidewire. A 5 French single lumen tunneled central venous catheter measuring 27 cm was tunneled in a  retrograde fashion from the anterior chest wall to the venotomy incision. The catheter was then placed through the peel-away sheath with tip ultimately positioned at the superior caval-atrial junction. Final catheter positioning was confirmed and documented with a spot radiographic image. The catheter aspirates and flushes normally. The catheter was flushed with appropriate volume heparin dwells. The catheter exit site was secured with a 0-Prolene retention suture. The venotomy incision was closed with Dermabond and Steri-strips. Dressings were applied. The patient tolerated the procedure well without immediate post procedural complication. FINDINGS: After catheter placement, the tip lies within the superior cavoatrial junction. The catheter aspirates and flushes normally and is ready for immediate use. IMPRESSION: Successful placement of 27 cm single lumen tunneled central venous catheter via the right internal jugular vein with tip terminating at the superior caval atrial junction. The catheter is ready for immediate use. Electronically Signed   By: Sandi Mariscal M.D.   On: 08/01/2017 10:59   Dg Fluoro Guided Needle Plc Aspiration/injection Loc  Addendum Date: 07/24/2017   ADDENDUM REPORT: 07/24/2017 09:43 ADDENDUM: Please note that aspiration was performed of the left SHOULDER, and not the left hip. EXAM: Left shoulder aspiration under fluoroscopy PROCEDURE: Overlying skin prepped with Betadine, draped in the usual sterile fashion, and infiltrated locally with buffered lidocaine. Curved 22 gauge spinal needle advanced to the superior medial margin of the left humeral head. 1 mL of lidocaine injected easily. Diagnostic injection of iodinated contrast demonstrate intra-articular spread without intravascular component. Once appropriate positioning was confirmed, aspiration was performed. 1 cc serosanguineous fluid was aspirated and sent for laboratory testing. No immediate complications. IMPRESSION: Technically  successful left shoulder  aspiration under fluoroscopy guidance. Electronically Signed   By: Fidela Salisbury M.D.   On: 07/24/2017 09:43   Result Date: 07/24/2017 CLINICAL DATA:  Clinical concern for osteomyelitis of the left shoulder. EXAM: LEFT HIP INJECTION UNDER FLUOROSCOPY FLUOROSCOPY TIME:  Fluoroscopy Time:  42 seconds PROCEDURE: Overlying skin prepped with Betadine, draped in the usual sterile fashion, and infiltrated locally with buffered Lidocaine. Curved 22 gauge spinal needle advanced to the superolateral margin of the left femoral head. 1 ml of Lidocaine injected easily. Diagnostic injection of iodinated contrast demonstrates intra-articular spread without intravascular component. Once appropriate positioning was confirmed, aspiration was performed. 1 cc serosanguineous fluid was aspirated and sent for laboratory testing. No immediate complication. IMPRESSION: Technically successful left hip aspiration under fluoroscopy. Electronically Signed: By: Fidela Salisbury M.D. On: 07/23/2017 18:31      Subjective: - no chest pain, shortness of breath, no abdominal pain, nausea or vomiting.   Discharge Exam: Vitals:   08/18/17 2221 08/19/17 0529  BP: (!) 147/83 137/84  Pulse: 71 71  Resp: 18 18  Temp: 98.6 F (37 C) 98.6 F (37 C)  SpO2: 100% 96%    General: Pt is alert, awake, not in acute distress Cardiovascular: RRR, S1/S2 +, no rubs, no gallops Respiratory: CTA bilaterally, no wheezing, no rhonchi Abdominal: Soft, NT, ND, bowel sounds + Extremities: no edema, no cyanosis    The results of significant diagnostics from this hospitalization (including imaging, microbiology, ancillary and laboratory) are listed below for reference.     Microbiology: No results found for this or any previous visit (from the past 240 hour(s)).   Labs: BNP (last 3 results) No results for input(s): BNP in the last 8760 hours. Basic Metabolic Panel: Recent Labs  Lab 08/13/17 0423  08/15/17 0327 08/17/17 0453 08/18/17 0337  NA 130* 130* 128* 130*  K 3.8 4.1 4.0 4.0  CL 100* 97* 101 99*  CO2 _0 GLUCOSE 115* 92 105* 97  BUN 59* 61* 61* 58*  CREATININE 3.76* 3.29* 3.29* 3.21*  CALCIUM 8.3* 8.4* 8.1* 8.4*   Liver Function Tests: No results for input(s): AST, ALT, ALKPHOS, BILITOT, PROT, ALBUMIN in the last 168 hours. No results for input(s): LIPASE, AMYLASE in the last 168 hours. No results for input(s): AMMONIA in the last 168 hours. CBC: Recent Labs  Lab 08/15/17 1314 08/18/17 0337  WBC 7.4 7.8  HGB 8.4* 8.5*  HCT 25.7* 26.0*  MCV 89.5 89.0  PLT 139* 135*   Cardiac Enzymes: Recent Labs  Lab 08/15/17 0327  CKTOTAL 67   BNP: Invalid input(s): POCBNP CBG: Recent Labs  Lab 08/15/17 0745 08/16/17 0743 08/17/17 0744 08/18/17 0751 08/19/17 0801  GLUCAP 105* 112* 102* 95 105*   D-Dimer No results for input(s): DDIMER in the last 72 hours. Hgb A1c No results for input(s): HGBA1C in the last 72 hours. Lipid Profile No results for input(s): CHOL, HDL, LDLCALC, TRIG, CHOLHDL, LDLDIRECT in the last 72 hours. Thyroid function studies No results for input(s): TSH, T4TOTAL, T3FREE, THYROIDAB in the last 72 hours.  Invalid input(s): FREET3 Anemia work up No results for input(s): VITAMINB12, FOLATE, FERRITIN, TIBC, IRON, RETICCTPCT in the last 72 hours. Urinalysis    Component Value Date/Time   COLORURINE STRAW (A) 07/26/2017 1708   APPEARANCEUR CLEAR 07/26/2017 1708   LABSPEC 1.003 (L) 07/26/2017 1708   PHURINE 5.0 07/26/2017 1708   GLUCOSEU NEGATIVE 07/26/2017 1708   HGBUR LARGE (A) 07/26/2017 [place] NEGATIVE 07/26/2017 1708  KETONESUR NEGATIVE 07/26/2017 1708   PROTEINUR NEGATIVE 07/26/2017 1708   NITRITE NEGATIVE 07/26/2017 1708   LEUKOCYTESUR NEGATIVE 07/26/2017 1708   Sepsis Labs Invalid input(s): PROCALCITONIN,  WBC,  LACTICIDVEN   Time coordinating discharge: 45 minutes  SIGNED:  Marzetta Board,  MD  [facility] 08/19/2017, 12:37 PM Pager [phone]  If 7PM-7AM, please contact night-coverage [email or web address] Password [email or web address]

## 2017-08-19 NOTE — Procedures (Signed)
PROCEDURE SUMMARY:  Successful removal of tunneled central venous catheter.  Patient tolerated well.  See full dictation in Imaging for details.  Recie Cirrincione S Clem Wisenbaker PA-C 08/19/2017 11:14 AM

## 2017-08-19 NOTE — Progress Notes (Signed)
Patient discharged to home. Verbalizes understanding of all discharge instructions including discharge medications and follow up MD visits. Patient accompanied by step dad.

## 2017-08-19 NOTE — Progress Notes (Signed)
Spoke with RN re IR to d/c tunneled lines.

## 2017-10-24 ENCOUNTER — Encounter (HOSPITAL_COMMUNITY): Payer: Self-pay | Admitting: Orthopaedic Surgery

## 2017-10-24 NOTE — OR Nursing (Signed)
Late entry due to correction of documentation error.

## 2017-11-14 ENCOUNTER — Emergency Department (HOSPITAL_COMMUNITY): Payer: Medicare Other

## 2017-11-14 ENCOUNTER — Observation Stay (HOSPITAL_COMMUNITY)
Admission: EM | Admit: 2017-11-14 | Discharge: 2017-11-14 | Disposition: A | Discharge status: Home / Self Care | Payer: Medicare Other | Attending: Family Medicine | Admitting: Family Medicine

## 2017-11-14 ENCOUNTER — Encounter (HOSPITAL_COMMUNITY): Payer: Self-pay | Admitting: Emergency Medicine

## 2017-11-14 DIAGNOSIS — K219 Gastro-esophageal reflux disease without esophagitis: Secondary | ICD-10-CM | POA: Insufficient documentation

## 2017-11-14 DIAGNOSIS — R21 Rash and other nonspecific skin eruption: Secondary | ICD-10-CM | POA: Diagnosis present

## 2017-11-14 DIAGNOSIS — E785 Hyperlipidemia, unspecified: Secondary | ICD-10-CM | POA: Insufficient documentation

## 2017-11-14 DIAGNOSIS — I10 Essential (primary) hypertension: Secondary | ICD-10-CM | POA: Diagnosis not present

## 2017-11-14 DIAGNOSIS — Z79899 Other long term (current) drug therapy: Secondary | ICD-10-CM | POA: Insufficient documentation

## 2017-11-14 DIAGNOSIS — R0789 Other chest pain: Secondary | ICD-10-CM | POA: Insufficient documentation

## 2017-11-14 DIAGNOSIS — J449 Chronic obstructive pulmonary disease, unspecified: Secondary | ICD-10-CM | POA: Diagnosis not present

## 2017-11-14 DIAGNOSIS — I252 Old myocardial infarction: Secondary | ICD-10-CM | POA: Insufficient documentation

## 2017-11-14 DIAGNOSIS — F17210 Nicotine dependence, cigarettes, uncomplicated: Secondary | ICD-10-CM | POA: Diagnosis not present

## 2017-11-14 DIAGNOSIS — F418 Other specified anxiety disorders: Secondary | ICD-10-CM | POA: Diagnosis not present

## 2017-11-14 DIAGNOSIS — R0782 Intercostal pain: Secondary | ICD-10-CM

## 2017-11-14 DIAGNOSIS — I2510 Atherosclerotic heart disease of native coronary artery without angina pectoris: Secondary | ICD-10-CM | POA: Diagnosis not present

## 2017-11-14 DIAGNOSIS — R079 Chest pain, unspecified: Secondary | ICD-10-CM | POA: Diagnosis present

## 2017-11-14 LAB — CBC WITH DIFFERENTIAL/PLATELET
ABS IMMATURE GRANULOCYTES: 0 10*3/uL (ref 0.0–0.1)
BASOS PCT: 1 %
Basophils Absolute: 0 10*3/uL (ref 0.0–0.1)
Eosinophils Absolute: 0.3 10*3/uL (ref 0.0–0.7)
Eosinophils Relative: 6 %
HEMATOCRIT: 34.7 % — AB (ref 39.0–52.0)
Hemoglobin: 11.4 g/dL — ABNORMAL LOW (ref 13.0–17.0)
IMMATURE GRANULOCYTES: 0 %
LYMPHS ABS: 1.8 10*3/uL (ref 0.7–4.0)
Lymphocytes Relative: 30 %
MCH: 29.7 pg (ref 26.0–34.0)
MCHC: 32.9 g/dL (ref 30.0–36.0)
MCV: 90.4 fL (ref 78.0–100.0)
MONO ABS: 0.7 10*3/uL (ref 0.1–1.0)
MONOS PCT: 11 %
NEUTROS ABS: 3 10*3/uL (ref 1.7–7.7)
NEUTROS PCT: 52 %
PLATELETS: 121 10*3/uL — AB (ref 150–400)
RBC: 3.84 MIL/uL — ABNORMAL LOW (ref 4.22–5.81)
RDW: 17.7 % — ABNORMAL HIGH (ref 11.5–15.5)
WBC: 5.8 10*3/uL (ref 4.0–10.5)

## 2017-11-14 LAB — RAPID URINE DRUG SCREEN, HOSP PERFORMED
Amphetamines: NOT DETECTED
Barbiturates: NOT DETECTED
Benzodiazepines: NOT DETECTED
Cocaine: NOT DETECTED
Opiates: NOT DETECTED
Tetrahydrocannabinol: NOT DETECTED

## 2017-11-14 LAB — COMPREHENSIVE METABOLIC PANEL
ALT: 22 U/L (ref 0–44)
AST: 44 U/L — AB (ref 15–41)
Albumin: 3.7 g/dL (ref 3.5–5.0)
Alkaline Phosphatase: 116 U/L (ref 38–126)
Anion gap: 9 (ref 5–15)
BUN: 18 mg/dL (ref 6–20)
CHLORIDE: 109 mmol/L (ref 98–111)
CO2: 21 mmol/L — ABNORMAL LOW (ref 22–32)
CREATININE: 2.38 mg/dL — AB (ref 0.61–1.24)
Calcium: 9.1 mg/dL (ref 8.9–10.3)
GFR, EST AFRICAN AMERICAN: 35 mL/min — AB (ref >60)
GFR, EST NON AFRICAN AMERICAN: 30 mL/min — AB (ref >60)
Glucose, Bld: 184 mg/dL — ABNORMAL HIGH (ref 70–99)
Potassium: 3.8 mmol/L (ref 3.5–5.1)
Sodium: 139 mmol/L (ref 135–145)
TOTAL PROTEIN: 7.7 g/dL (ref 6.5–8.1)
Total Bilirubin: 0.8 mg/dL (ref 0.3–1.2)

## 2017-11-14 LAB — I-STAT TROPONIN, ED
TROPONIN I, POC: 0.03 ng/mL (ref 0.00–0.08)
Troponin i, poc: 0.01 ng/mL (ref 0.00–0.08)

## 2017-11-14 MED ORDER — GI COCKTAIL ~~LOC~~
30.0000 mL | Freq: Once | ORAL | Status: AC
  Administered 2017-11-14: 30 mL via ORAL
  Filled 2017-11-14: qty 30

## 2017-11-14 MED ORDER — PERMETHRIN 5 % EX CREA: TOPICAL_CREAM | CUTANEOUS | 0 refills | Status: DC

## 2017-11-14 MED ORDER — RANITIDINE HCL 150 MG PO TABS: 150.0000 mg | ORAL_TABLET | Freq: Two times a day (BID) | ORAL | 0 refills | Status: DC
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
## 2017-11-14 NOTE — ED Notes (Signed)
Attempted IV x 2 by nurse unable to obtain IV access.
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
## 2017-11-14 NOTE — Discharge Instructions (Addendum)
Thoroughly massage the permethrin cream from head to soles of feet.  Leave on for 8 to 14 hours before removing by showering.  Wash all of your sheets and clothing in hot water.  You can also use topical hydrocortisone cream, Benadryl, and Pepcid for itching.  Take Pepcid twice daily, Benadryl up to 4 times daily.  Do not apply hydrocortisone cream to the scalp or face.  Start taking Zantac twice daily for your chest pain.  Follow-up with a primary care physician or cardiologist will be very important.  I have given you the information for the family medicine clinic but you can also call the number on the back of your Medicare card to see what physicians are in your network in [place].  Return to the emergency department immediately for any concerning signs or symptoms develop such as worsening chest pain, cough, fevers.

## 2017-11-14 NOTE — ED Notes (Signed)
Provider at bedside

## 2017-11-14 NOTE — ED Notes (Signed)
Admit doctor at bedside

## 2017-11-14 NOTE — ED Notes (Signed)
IV team at bedside 

## 2017-11-14 NOTE — ED Triage Notes (Addendum)
Pt reports he has been staying at [facility] last week, been breaking out in different spots and thinks worm is coming out of skin. He states the [facility] came and found bed bugs and he left last week.  Chest pain starting 3-4 days ago across chest and into arm- reports heartburn as well. Been on antibiotics with PICC from arm infection and was recently hospitalized.

## 2017-11-14 NOTE — ED Notes (Signed)
Pt verbalizes understanding of d/c instructions. Prescriptions reviewed with patient. Pt ambulatory at d/c with all belongings and with family.   

## 2017-11-14 NOTE — ED Notes (Signed)
Admitting MDs at bedside.

## 2018-01-16 ENCOUNTER — Encounter (HOSPITAL_COMMUNITY): Payer: Self-pay | Admitting: Emergency Medicine

## 2018-01-16 ENCOUNTER — Emergency Department (HOSPITAL_COMMUNITY)
Admission: EM | Admit: 2018-01-16 | Discharge: 2018-01-16 | Disposition: A | Discharge status: Home / Self Care | Payer: Medicare Other | Attending: Emergency Medicine | Admitting: Emergency Medicine

## 2018-01-16 ENCOUNTER — Emergency Department (HOSPITAL_COMMUNITY): Payer: Medicare Other

## 2018-01-16 DIAGNOSIS — Z79899 Other long term (current) drug therapy: Secondary | ICD-10-CM | POA: Insufficient documentation

## 2018-01-16 DIAGNOSIS — R319 Hematuria, unspecified: Secondary | ICD-10-CM | POA: Insufficient documentation

## 2018-01-16 DIAGNOSIS — F17210 Nicotine dependence, cigarettes, uncomplicated: Secondary | ICD-10-CM | POA: Diagnosis not present

## 2018-01-16 DIAGNOSIS — N451 Epididymitis: Secondary | ICD-10-CM

## 2018-01-16 DIAGNOSIS — N390 Urinary tract infection, site not specified: Secondary | ICD-10-CM | POA: Diagnosis not present

## 2018-01-16 DIAGNOSIS — J449 Chronic obstructive pulmonary disease, unspecified: Secondary | ICD-10-CM | POA: Diagnosis not present

## 2018-01-16 DIAGNOSIS — I2510 Atherosclerotic heart disease of native coronary artery without angina pectoris: Secondary | ICD-10-CM | POA: Insufficient documentation

## 2018-01-16 DIAGNOSIS — N189 Chronic kidney disease, unspecified: Secondary | ICD-10-CM | POA: Diagnosis not present

## 2018-01-16 DIAGNOSIS — I129 Hypertensive chronic kidney disease with stage 1 through stage 4 chronic kidney disease, or unspecified chronic kidney disease: Secondary | ICD-10-CM | POA: Diagnosis not present

## 2018-01-16 DIAGNOSIS — N50812 Left testicular pain: Secondary | ICD-10-CM | POA: Diagnosis present

## 2018-01-16 LAB — CBC WITH DIFFERENTIAL/PLATELET
Abs Immature Granulocytes: 0.07 10*3/uL (ref 0.00–0.07)
Basophils Absolute: 0.1 10*3/uL (ref 0.0–0.1)
Basophils Relative: 0 %
EOS PCT: 1 %
Eosinophils Absolute: 0.2 10*3/uL (ref 0.0–0.5)
HEMATOCRIT: 36.9 % — AB (ref 39.0–52.0)
HEMOGLOBIN: 12.1 g/dL — AB (ref 13.0–17.0)
Immature Granulocytes: 1 %
LYMPHS ABS: 1.9 10*3/uL (ref 0.7–4.0)
Lymphocytes Relative: 13 %
MCH: 30.2 pg (ref 26.0–34.0)
MCHC: 32.8 g/dL (ref 30.0–36.0)
MCV: 92 fL (ref 80.0–100.0)
MONO ABS: 1.6 10*3/uL — AB (ref 0.1–1.0)
Monocytes Relative: 11 %
Neutro Abs: 11.2 10*3/uL — ABNORMAL HIGH (ref 1.7–7.7)
Neutrophils Relative %: 74 %
Platelets: 129 10*3/uL — ABNORMAL LOW (ref 150–400)
RBC: 4.01 MIL/uL — ABNORMAL LOW (ref 4.22–5.81)
RDW: 14.3 % (ref 11.5–15.5)
WBC: 15.1 10*3/uL — AB (ref 4.0–10.5)
nRBC: 0 % (ref 0.0–0.2)

## 2018-01-16 LAB — COMPREHENSIVE METABOLIC PANEL
ALT: 17 U/L (ref 0–44)
AST: 24 U/L (ref 15–41)
Albumin: 3.6 g/dL (ref 3.5–5.0)
Alkaline Phosphatase: 81 U/L (ref 38–126)
Anion gap: 8 (ref 5–15)
BUN: 23 mg/dL — AB (ref 6–20)
CHLORIDE: 102 mmol/L (ref 98–111)
CO2: 22 mmol/L (ref 22–32)
CREATININE: 2.12 mg/dL — AB (ref 0.61–1.24)
Calcium: 9 mg/dL (ref 8.9–10.3)
GFR calc Af Amer: 40 mL/min — ABNORMAL LOW (ref >60)
GFR, EST NON AFRICAN AMERICAN: 35 mL/min — AB (ref >60)
Glucose, Bld: 108 mg/dL — ABNORMAL HIGH (ref 70–99)
Potassium: 4.1 mmol/L (ref 3.5–5.1)
Sodium: 132 mmol/L — ABNORMAL LOW (ref 135–145)
Total Bilirubin: 0.8 mg/dL (ref 0.3–1.2)
Total Protein: 7.9 g/dL (ref 6.5–8.1)

## 2018-01-16 LAB — URINALYSIS, ROUTINE W REFLEX MICROSCOPIC
Bilirubin Urine: NEGATIVE
Glucose, UA: NEGATIVE mg/dL
KETONES UR: NEGATIVE mg/dL
Nitrite: POSITIVE — AB
PH: 6 (ref 5.0–8.0)
Protein, ur: 30 mg/dL — AB
SPECIFIC GRAVITY, URINE: 1.008 (ref 1.005–1.030)

## 2018-01-16 MED ORDER — LEVOFLOXACIN 250 MG PO TABS: 500.0000 mg | ORAL_TABLET | Freq: Every day | ORAL | 0 refills | Status: AC

## 2018-01-16 MED ORDER — LEVOFLOXACIN 500 MG PO TABS
500.0000 mg | ORAL_TABLET | Freq: Once | ORAL | Status: AC
  Administered 2018-01-16: 500 mg via ORAL
  Filled 2018-01-16: qty 1

## 2018-01-16 MED ORDER — OXYCODONE HCL 5 MG PO CAPS: 5.0000 mg | ORAL_CAPSULE | Freq: Four times a day (QID) | ORAL | 0 refills | Status: DC | PRN

## 2018-01-16 MED ORDER — LIDOCAINE HCL (PF) 1 % IJ SOLN
2.0000 mL | Freq: Once | INTRAMUSCULAR | Status: AC
  Administered 2018-01-16: 2 mL
  Filled 2018-01-16: qty 5

## 2018-01-16 MED ORDER — OXYCODONE HCL 5 MG PO TABS
5.0000 mg | ORAL_TABLET | Freq: Once | ORAL | Status: AC
  Administered 2018-01-16: 5 mg via ORAL
  Filled 2018-01-16: qty 1

## 2018-01-16 MED ORDER — CEFTRIAXONE SODIUM 250 MG IJ SOLR
250.0000 mg | Freq: Once | INTRAMUSCULAR | Status: AC
  Administered 2018-01-16: 250 mg via INTRAMUSCULAR
  Filled 2018-01-16: qty 250

## 2018-01-16 MED ORDER — OXYCODONE HCL 5 MG PO TABS
2.5000 mg | ORAL_TABLET | Freq: Once | ORAL | Status: AC
  Administered 2018-01-16: 2.5 mg via ORAL
  Filled 2018-01-16: qty 1

## 2018-01-16 NOTE — ED Triage Notes (Signed)
Pt here from home with c/o left swollen testicle pain with swelling times 3 days , some burning while urination

## 2018-01-16 NOTE — ED Notes (Signed)
Patient transported to Ultrasound 
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
## 2018-01-16 NOTE — Discharge Instructions (Addendum)
Please see the information and instructions below regarding your visit.  Your diagnoses today include:  1. Epididymitis   2. Chronic kidney disease, unspecified CKD stage   3. Urinary tract infection with hematuria, site unspecified    Tests performed today include: See side panel of your discharge paperwork for testing performed today. Vital signs are listed at the bottom of these instructions.   Your work-up is suggesting a infection of the testicle.  This is often caused by urinary bugs.   Sometimes these are caused by section transmitted infections.  We tested and treated you for these today.  You will get a call if anything is positive.  Medications prescribed:    Take any prescribed medications only as prescribed, and any over the counter medications only as directed on the packaging.  Please take all of your antibiotics until finished.   Please do not do any contact sports or new workout regimens while taking this antibiotic. You may develop abdominal discomfort or nausea from the antibiotic. If this occurs, you may take it with food. Some patients also get diarrhea with antibiotics. You may help offset this with probiotics which you can buy or get in yogurt. Do not eat or take the probiotics until 2 hours after your antibiotic.   Some people develop allergies to antibiotics. Symptoms of antibiotic allergy can be mild and include a flat rash and itching. They can also be more serious and include:  ?Hives - Hives are raised, red patches of skin that are usually very itchy.  ?Lip or tongue swelling  ?Trouble swallowing or breathing  ?Blistering of the skin or mouth.  If you have any of these serious symptoms, please seek emergency medical care immediately.  You have been prescribed Roxicodone for pain. This is an opioid pain medication. You may take this medication every 4-6 hours as needed for pain. Only take this medication if you need it for breakthrough pain.   Do not  combine this medication with alcohol.  Please be advised to avoid driving or operating heavy machinery while taking this medication, as it may make you drowsy or impair judgment.   Home care instructions:  Please follow any educational materials contained in this packet.   Follow-up instructions: Please follow-up with your primary care provider as soon as possible for further evaluation of your symptoms if they are not completely improved.   Please follow-up with urology as soon as possible.  Return instructions:  Please return to the Emergency Department if you experience worsening symptoms.  Please return to the emergency department as soon as possible if you develop any worsening pain, nausea or vomiting the prevention from keeping anything down, or fevers. Please return if you have any other emergent concerns.  Additional Information:   Your vital signs today were: BP (!) 161/84    Pulse 95    Temp 98.3 F (36.8 C) (Oral)    Resp 18    SpO2 100%  If your blood pressure (BP) was elevated on multiple readings during this visit above 130 for the top number or above 80 for the bottom number, please have this repeated by your primary care provider within one month. --------------  Thank you for allowing [place] to participate in your care today.

## 2018-01-17 LAB — HIV ANTIBODY (ROUTINE TESTING W REFLEX): HIV SCREEN 4TH GENERATION: NONREACTIVE

## 2018-01-17 LAB — RPR: RPR Ser Ql: NONREACTIVE

## 2018-01-17 LAB — GC/CHLAMYDIA PROBE AMP (~~LOC~~) NOT AT ARMC
Chlamydia: NEGATIVE
NEISSERIA GONORRHEA: NEGATIVE

## 2018-01-18 LAB — URINE CULTURE

## 2018-01-19 ENCOUNTER — Telehealth: Payer: Self-pay

## 2018-01-19 NOTE — Telephone Encounter (Signed)
Post ED Visit - Positive Culture Follow-up  Culture report reviewed by antimicrobial stewardship pharmacist:  []  Elenor Quinones, Pharm.D. []  Heide Guile, Pharm.D., BCPS AQ-ID [x]  Parks Neptune, Pharm.D., BCPS []  Alycia Rossetti, Pharm.D., BCPS []  [place], [place].D., BCPS, AAHIVP []  Legrand Como, Pharm.D., BCPS, AAHIVP []  Salome Arnt, PharmD, BCPS []  Johnnette Gourd, PharmD, BCPS []  Hughes Better, PharmD, BCPS []  Leeroy Cha, PharmD  Positive urine culture Treated with Levofloxacin, organism sensitive to the same and no further patient follow-up is required at this time.  Genia Del 01/19/2018, 10:24 AM

## 2018-07-26 ENCOUNTER — Encounter (HOSPITAL_COMMUNITY): Payer: Self-pay | Admitting: Emergency Medicine

## 2018-07-26 ENCOUNTER — Emergency Department (HOSPITAL_COMMUNITY)
Admission: EM | Admit: 2018-07-26 | Discharge: 2018-07-26 | Disposition: A | Discharge status: Home / Self Care | Payer: Medicare HMO | Attending: Emergency Medicine | Admitting: Emergency Medicine

## 2018-07-26 ENCOUNTER — Other Ambulatory Visit: Payer: Self-pay

## 2018-07-26 DIAGNOSIS — I2510 Atherosclerotic heart disease of native coronary artery without angina pectoris: Secondary | ICD-10-CM | POA: Diagnosis not present

## 2018-07-26 DIAGNOSIS — Z79899 Other long term (current) drug therapy: Secondary | ICD-10-CM | POA: Insufficient documentation

## 2018-07-26 DIAGNOSIS — I10 Essential (primary) hypertension: Secondary | ICD-10-CM | POA: Insufficient documentation

## 2018-07-26 DIAGNOSIS — Y929 Unspecified place or not applicable: Secondary | ICD-10-CM | POA: Diagnosis not present

## 2018-07-26 DIAGNOSIS — Z4802 Encounter for removal of sutures: Secondary | ICD-10-CM | POA: Insufficient documentation

## 2018-07-26 DIAGNOSIS — S41101D Unspecified open wound of right upper arm, subsequent encounter: Secondary | ICD-10-CM | POA: Diagnosis not present

## 2018-07-26 DIAGNOSIS — Y939 Activity, unspecified: Secondary | ICD-10-CM | POA: Insufficient documentation

## 2018-07-26 DIAGNOSIS — B181 Chronic viral hepatitis B without delta-agent: Secondary | ICD-10-CM | POA: Diagnosis not present

## 2018-07-26 DIAGNOSIS — G8911 Acute pain due to trauma: Secondary | ICD-10-CM | POA: Insufficient documentation

## 2018-07-26 DIAGNOSIS — Y999 Unspecified external cause status: Secondary | ICD-10-CM | POA: Diagnosis not present

## 2018-07-26 DIAGNOSIS — F17210 Nicotine dependence, cigarettes, uncomplicated: Secondary | ICD-10-CM | POA: Diagnosis not present

## 2018-07-26 DIAGNOSIS — B182 Chronic viral hepatitis C: Secondary | ICD-10-CM | POA: Diagnosis not present

## 2018-07-26 DIAGNOSIS — J449 Chronic obstructive pulmonary disease, unspecified: Secondary | ICD-10-CM | POA: Diagnosis not present

## 2018-07-26 DIAGNOSIS — S02642A Fracture of ramus of left mandible, initial encounter for closed fracture: Secondary | ICD-10-CM

## 2018-07-26 DIAGNOSIS — R42 Dizziness and giddiness: Secondary | ICD-10-CM | POA: Insufficient documentation

## 2018-07-26 DIAGNOSIS — I252 Old myocardial infarction: Secondary | ICD-10-CM | POA: Diagnosis not present

## 2018-07-26 DIAGNOSIS — R6884 Jaw pain: Secondary | ICD-10-CM | POA: Diagnosis present

## 2018-07-26 LAB — CBC WITH DIFFERENTIAL/PLATELET
Abs Immature Granulocytes: 0.03 10*3/uL (ref 0.00–0.07)
Basophils Absolute: 0.1 10*3/uL (ref 0.0–0.1)
Basophils Relative: 1 %
Eosinophils Absolute: 0.4 10*3/uL (ref 0.0–0.5)
Eosinophils Relative: 4 %
HCT: 39.6 % (ref 39.0–52.0)
Hemoglobin: 13 g/dL (ref 13.0–17.0)
Immature Granulocytes: 0 %
Lymphocytes Relative: 19 %
Lymphs Abs: 1.8 10*3/uL (ref 0.7–4.0)
MCH: 31 pg (ref 26.0–34.0)
MCHC: 32.8 g/dL (ref 30.0–36.0)
MCV: 94.3 fL (ref 80.0–100.0)
Monocytes Absolute: 1 10*3/uL (ref 0.1–1.0)
Monocytes Relative: 11 %
Neutro Abs: 5.9 10*3/uL (ref 1.7–7.7)
Neutrophils Relative %: 65 %
Platelets: 135 10*3/uL — ABNORMAL LOW (ref 150–400)
RBC: 4.2 MIL/uL — ABNORMAL LOW (ref 4.22–5.81)
RDW: 13.6 % (ref 11.5–15.5)
WBC: 9.3 10*3/uL (ref 4.0–10.5)
nRBC: 0 % (ref 0.0–0.2)

## 2018-07-26 LAB — BASIC METABOLIC PANEL
Anion gap: 9 (ref 5–15)
BUN: 18 mg/dL (ref 6–20)
CO2: 22 mmol/L (ref 22–32)
Calcium: 9.3 mg/dL (ref 8.9–10.3)
Chloride: 105 mmol/L (ref 98–111)
Creatinine, Ser: 1.72 mg/dL — ABNORMAL HIGH (ref 0.61–1.24)
GFR calc Af Amer: 52 mL/min — ABNORMAL LOW (ref >60)
GFR calc non Af Amer: 45 mL/min — ABNORMAL LOW (ref >60)
Glucose, Bld: 99 mg/dL (ref 70–99)
Potassium: 4.1 mmol/L (ref 3.5–5.1)
Sodium: 136 mmol/L (ref 135–145)

## 2018-07-26 MED ORDER — OXYCODONE HCL 5 MG PO TABS: 5.0000 mg | ORAL_TABLET | Freq: Four times a day (QID) | ORAL | 0 refills | Status: DC | PRN

## 2018-07-26 MED ORDER — IBUPROFEN 600 MG PO TABS: 600.0000 mg | ORAL_TABLET | Freq: Four times a day (QID) | ORAL | 0 refills | Status: DC | PRN

## 2018-07-26 MED ORDER — MORPHINE SULFATE (PF) 4 MG/ML IV SOLN
4.0000 mg | Freq: Once | INTRAVENOUS | Status: AC
  Administered 2018-07-26: 15:00:00 4 mg via INTRAVENOUS
  Filled 2018-07-26: qty 1
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
## 2018-07-26 NOTE — ED Notes (Addendum)
Nurse Navigator communication: The patient requested that his mother or stepfather be contacted to let them know "I am ok and I am at [place]". Update has been given along with plan of care.

## 2018-07-26 NOTE — Discharge Instructions (Addendum)
At this time your jaw does not need emergent surgery. Do not use your dentures for the next 6 weeks while your jaw heals. Eat a liquid diet and DO NOT CHEW ANYTHING while your jaw is healing. Avoid excessive jaw movements. Use ice to the area of pain, no more than 20 minutes per hour. Alternate between oxycodone and ibuprofen as directed as needed for pain but don't drive or operate machinery while taking oxycodone. Stay well hydrated. Follow up with Dr. Redmond Baseman' office this coming week for ongoing management of your jaw fracture.   Keep your arm wound clean with mild soap and water. Keep area covered with a topical antibiotic ointment and bandage, keep bandage dry, and do not submerge in water until the wound has healed. Ice and elevate for additional pain and swelling relief. Alternate between Ibuprofen and Tylenol for additional pain relief. Follow up with your primary care doctor or the [facility] in approximately 3-5 days for wound recheck. Monitor area for signs of infection to include, but not limited to: increasing pain, spreading redness, drainage/pus, worsening swelling, or fevers.   Return to emergency department for emergent changing or worsening symptoms.
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
## 2019-02-25 ENCOUNTER — Other Ambulatory Visit: Payer: Self-pay

## 2019-02-25 ENCOUNTER — Inpatient Hospital Stay (HOSPITAL_COMMUNITY)
Admission: EM | Admit: 2019-02-25 | Discharge: 2019-03-01 | DRG: 433 | Disposition: A | Discharge status: Home / Self Care | Payer: Medicare HMO | Source: Ambulatory Visit | Attending: Internal Medicine | Admitting: Internal Medicine

## 2019-02-25 ENCOUNTER — Emergency Department (HOSPITAL_COMMUNITY): Payer: Medicare HMO

## 2019-02-25 DIAGNOSIS — J438 Other emphysema: Secondary | ICD-10-CM | POA: Diagnosis not present

## 2019-02-25 DIAGNOSIS — D689 Coagulation defect, unspecified: Secondary | ICD-10-CM | POA: Diagnosis not present

## 2019-02-25 DIAGNOSIS — F1311 Sedative, hypnotic or anxiolytic abuse, in remission: Secondary | ICD-10-CM | POA: Diagnosis present

## 2019-02-25 DIAGNOSIS — F1011 Alcohol abuse, in remission: Secondary | ICD-10-CM | POA: Diagnosis present

## 2019-02-25 DIAGNOSIS — R7989 Other specified abnormal findings of blood chemistry: Secondary | ICD-10-CM

## 2019-02-25 DIAGNOSIS — I129 Hypertensive chronic kidney disease with stage 1 through stage 4 chronic kidney disease, or unspecified chronic kidney disease: Secondary | ICD-10-CM | POA: Diagnosis present

## 2019-02-25 DIAGNOSIS — B179 Acute viral hepatitis, unspecified: Secondary | ICD-10-CM | POA: Diagnosis not present

## 2019-02-25 DIAGNOSIS — K7460 Unspecified cirrhosis of liver: Secondary | ICD-10-CM

## 2019-02-25 DIAGNOSIS — K7469 Other cirrhosis of liver: Secondary | ICD-10-CM | POA: Diagnosis not present

## 2019-02-25 DIAGNOSIS — R161 Splenomegaly, not elsewhere classified: Secondary | ICD-10-CM | POA: Diagnosis present

## 2019-02-25 DIAGNOSIS — K8020 Calculus of gallbladder without cholecystitis without obstruction: Secondary | ICD-10-CM | POA: Diagnosis present

## 2019-02-25 DIAGNOSIS — K7290 Hepatic failure, unspecified without coma: Secondary | ICD-10-CM | POA: Diagnosis not present

## 2019-02-25 DIAGNOSIS — B159 Hepatitis A without hepatic coma: Secondary | ICD-10-CM | POA: Diagnosis present

## 2019-02-25 DIAGNOSIS — K766 Portal hypertension: Secondary | ICD-10-CM | POA: Diagnosis present

## 2019-02-25 DIAGNOSIS — Z885 Allergy status to narcotic agent status: Secondary | ICD-10-CM | POA: Diagnosis not present

## 2019-02-25 DIAGNOSIS — I868 Varicose veins of other specified sites: Secondary | ICD-10-CM | POA: Diagnosis present

## 2019-02-25 DIAGNOSIS — I252 Old myocardial infarction: Secondary | ICD-10-CM | POA: Diagnosis not present

## 2019-02-25 DIAGNOSIS — J449 Chronic obstructive pulmonary disease, unspecified: Secondary | ICD-10-CM | POA: Diagnosis present

## 2019-02-25 DIAGNOSIS — F1111 Opioid abuse, in remission: Secondary | ICD-10-CM | POA: Diagnosis present

## 2019-02-25 DIAGNOSIS — N179 Acute kidney failure, unspecified: Secondary | ICD-10-CM | POA: Diagnosis present

## 2019-02-25 DIAGNOSIS — K7581 Nonalcoholic steatohepatitis (NASH): Secondary | ICD-10-CM | POA: Diagnosis present

## 2019-02-25 DIAGNOSIS — I10 Essential (primary) hypertension: Secondary | ICD-10-CM | POA: Diagnosis present

## 2019-02-25 DIAGNOSIS — F17210 Nicotine dependence, cigarettes, uncomplicated: Secondary | ICD-10-CM | POA: Diagnosis present

## 2019-02-25 DIAGNOSIS — Z8349 Family history of other endocrine, nutritional and metabolic diseases: Secondary | ICD-10-CM

## 2019-02-25 DIAGNOSIS — R1011 Right upper quadrant pain: Secondary | ICD-10-CM

## 2019-02-25 DIAGNOSIS — E785 Hyperlipidemia, unspecified: Secondary | ICD-10-CM | POA: Diagnosis present

## 2019-02-25 DIAGNOSIS — N1831 Chronic kidney disease, stage 3a: Secondary | ICD-10-CM | POA: Diagnosis present

## 2019-02-25 DIAGNOSIS — R509 Fever, unspecified: Secondary | ICD-10-CM

## 2019-02-25 DIAGNOSIS — I2510 Atherosclerotic heart disease of native coronary artery without angina pectoris: Secondary | ICD-10-CM | POA: Diagnosis present

## 2019-02-25 DIAGNOSIS — N183 Chronic kidney disease, stage 3 unspecified: Secondary | ICD-10-CM | POA: Diagnosis not present

## 2019-02-25 DIAGNOSIS — Z20828 Contact with and (suspected) exposure to other viral communicable diseases: Secondary | ICD-10-CM | POA: Diagnosis present

## 2019-02-25 LAB — COMPREHENSIVE METABOLIC PANEL
ALT: 1237 U/L — ABNORMAL HIGH (ref 0–44)
AST: 1274 U/L — ABNORMAL HIGH (ref 15–41)
Albumin: 3.5 g/dL (ref 3.5–5.0)
Alkaline Phosphatase: 114 U/L (ref 38–126)
Anion gap: 13 (ref 5–15)
BUN: 26 mg/dL — ABNORMAL HIGH (ref 6–20)
CO2: 18 mmol/L — ABNORMAL LOW (ref 22–32)
Calcium: 9.2 mg/dL (ref 8.9–10.3)
Chloride: 105 mmol/L (ref 98–111)
Creatinine, Ser: 2.15 mg/dL — ABNORMAL HIGH (ref 0.61–1.24)
GFR calc Af Amer: 40 mL/min — ABNORMAL LOW (ref >60)
GFR calc non Af Amer: 34 mL/min — ABNORMAL LOW (ref >60)
Glucose, Bld: 119 mg/dL — ABNORMAL HIGH (ref 70–99)
Potassium: 4.5 mmol/L (ref 3.5–5.1)
Sodium: 136 mmol/L (ref 135–145)
Total Bilirubin: 19.2 mg/dL (ref 0.3–1.2)
Total Protein: 8.8 g/dL — ABNORMAL HIGH (ref 6.5–8.1)

## 2019-02-25 LAB — POCT I-STAT EG7
Acid-base deficit: 7 mmol/L — ABNORMAL HIGH (ref 0.0–2.0)
Bicarbonate: 19.4 mmol/L — ABNORMAL LOW (ref 20.0–28.0)
Calcium, Ion: 1.06 mmol/L — ABNORMAL LOW (ref 1.15–1.40)
HCT: 49 % (ref 39.0–52.0)
Hemoglobin: 16.7 g/dL (ref 13.0–17.0)
O2 Saturation: 95 %
Potassium: 4.4 mmol/L (ref 3.5–5.1)
Sodium: 138 mmol/L (ref 135–145)
TCO2: 21 mmol/L — ABNORMAL LOW (ref 22–32)
pCO2, Ven: 40 mmHg — ABNORMAL LOW (ref 44.0–60.0)
pH, Ven: 7.295 (ref 7.250–7.430)
pO2, Ven: 81 mmHg — ABNORMAL HIGH (ref 32.0–45.0)

## 2019-02-25 LAB — CBC WITH DIFFERENTIAL/PLATELET
Abs Immature Granulocytes: 0.03 10*3/uL (ref 0.00–0.07)
Basophils Absolute: 0 10*3/uL (ref 0.0–0.1)
Basophils Relative: 1 %
Eosinophils Absolute: 0 10*3/uL (ref 0.0–0.5)
Eosinophils Relative: 1 %
HCT: 45.5 % (ref 39.0–52.0)
Hemoglobin: 15.5 g/dL (ref 13.0–17.0)
Immature Granulocytes: 1 %
Lymphocytes Relative: 27 %
Lymphs Abs: 1.6 10*3/uL (ref 0.7–4.0)
MCH: 30.5 pg (ref 26.0–34.0)
MCHC: 34.1 g/dL (ref 30.0–36.0)
MCV: 89.6 fL (ref 80.0–100.0)
Monocytes Absolute: 0.5 10*3/uL (ref 0.1–1.0)
Monocytes Relative: 8 %
Neutro Abs: 3.6 10*3/uL (ref 1.7–7.7)
Neutrophils Relative %: 62 %
Platelets: 148 10*3/uL — ABNORMAL LOW (ref 150–400)
RBC: 5.08 MIL/uL (ref 4.22–5.81)
RDW: 17.5 % — ABNORMAL HIGH (ref 11.5–15.5)
WBC: 5.7 10*3/uL (ref 4.0–10.5)
nRBC: 0 % (ref 0.0–0.2)

## 2019-02-25 LAB — PROTIME-INR
INR: 1.4 — ABNORMAL HIGH (ref 0.8–1.2)
Prothrombin Time: 17 seconds — ABNORMAL HIGH (ref 11.4–15.2)

## 2019-02-25 LAB — I-STAT CREATININE, ED: Creatinine, Ser: 2 mg/dL — ABNORMAL HIGH (ref 0.61–1.24)

## 2019-02-25 LAB — LIPASE, BLOOD: Lipase: 30 U/L (ref 11–51)

## 2019-02-25 MED ORDER — ONDANSETRON HCL 4 MG/2ML IJ SOLN
4.0000 mg | Freq: Four times a day (QID) | INTRAMUSCULAR | Status: DC | PRN
  Administered 2019-02-25 – 2019-02-28 (×4): 4 mg via INTRAVENOUS
  Filled 2019-02-25 (×5): qty 2

## 2019-02-25 MED ORDER — HYDROMORPHONE HCL 1 MG/ML IJ SOLN
0.5000 mg | INTRAMUSCULAR | Status: DC | PRN
  Administered 2019-02-26 – 2019-03-01 (×14): 0.5 mg via INTRAVENOUS
  Filled 2019-02-25 (×15): qty 1

## 2019-02-25 MED ORDER — ONDANSETRON HCL 4 MG/2ML IJ SOLN
4.0000 mg | Freq: Once | INTRAMUSCULAR | Status: AC
  Administered 2019-02-25: 4 mg via INTRAVENOUS
  Filled 2019-02-25: qty 2

## 2019-02-25 MED ORDER — SODIUM CHLORIDE 0.9 % IV BOLUS
1000.0000 mL | Freq: Once | INTRAVENOUS | Status: AC
  Administered 2019-02-25: 15:00:00 1000 mL via INTRAVENOUS

## 2019-02-25 MED ORDER — MORPHINE SULFATE (PF) 4 MG/ML IV SOLN
8.0000 mg | Freq: Once | INTRAVENOUS | Status: AC
  Administered 2019-02-25: 8 mg via INTRAVENOUS
  Filled 2019-02-25: qty 2

## 2019-02-25 MED ORDER — ENOXAPARIN SODIUM 40 MG/0.4ML ~~LOC~~ SOLN
40.0000 mg | SUBCUTANEOUS | Status: DC
  Administered 2019-02-25 – 2019-02-26 (×2): 40 mg via SUBCUTANEOUS
  Filled 2019-02-25 (×2): qty 0.4

## 2019-02-25 MED ORDER — MORPHINE SULFATE (PF) 4 MG/ML IV SOLN
4.0000 mg | Freq: Once | INTRAVENOUS | Status: AC
  Administered 2019-02-25: 23:00:00 4 mg via INTRAVENOUS
  Filled 2019-02-25: qty 1
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
## 2019-02-25 NOTE — ED Notes (Signed)
Got patient undress on the monitor patient is resting with nurse at bedside

## 2019-02-25 NOTE — ED Provider Notes (Signed)
4:30 PM-checkout from Dr. Tyrone Nine to evaluate patient after CT imaging returns.  Patient presented today for nausea and vomiting with jaundice.  He has a 3-day illness.  He has a history of  of hepatitis C, in the past.   Patient Vitals for the past 24 hrs:  BP Temp Temp src Pulse Resp SpO2 Height Weight  02/25/19 1900 -- -- -- 70 13 100 % -- --  02/25/19 1645 -- -- -- 91 16 96 % -- --  02/25/19 1630 -- -- -- 89 16 97 % -- --  02/25/19 1615 -- -- -- 85 17 100 % -- --  02/25/19 1600 -- -- -- 84 15 98 % -- --  02/25/19 1545 -- -- -- 89 16 100 % -- --  02/25/19 1530 -- -- -- 88 17 100 % -- --  02/25/19 1515 -- -- -- 84 13 100 % -- --  02/25/19 1445 -- -- -- 84 13 100 % -- --  02/25/19 1435 -- -- -- -- -- -- 5' 8"  (1.727 m) 80.7 kg  02/25/19 1430 -- -- -- 80 17 100 % -- --  02/25/19 1415 -- -- -- 80 12 100 % -- --  02/25/19 1405 (!) 148/99 97.7 F (36.5 C) Oral 85 18 100 % -- --   Clinical Course as of Feb 24 2201  Tue Feb 25, 2019  1844 Interpretation by radiology, cirrhosis with portal hypertension and splenomegaly.  Cholelithiasis, similar to prior, without evidence for cholecystitis, or obstructing biliary tract stone.  No intestinal disorders beyond abdominal varices.  Normal appendix.  Incidental aortic atherosclerosis.  CT ABDOMEN PELVIS WO CONTRAST [EW]  1922 Case discussed with GI, Dr. Michail Sermon who will see the patient as a Optometrist.  He request hospitalist to admit the patient.   [EW]    Clinical Course User Index [EW] Daleen Bo, MD    6:38 PM Reevaluation with update and discussion. After initial assessment and treatment, an updated evaluation reveals he is alert and comfortable.  Abdomen is tender to palpation right upper quadrant.  Patient nontoxic.  There is no dysarthria.  There is no aphasia.  He is cooperative.  Findings discussed with the patient and all questions were answered. Daleen Bo   Medical Decision Making: Patient presenting with right upper quadrant  abdominal pain, 3 days of nausea and vomiting.  He appears to have recurrence of hepatitis.  He states he was previously treated with a course of medication for hepatitis C and was told that it probably would not come back.  He is not currently seeing a gastroenterologist.  He lives in [place], [place].  Labs today indicate chronic hepatitis with very high total bilirubin.  Will not specify a particular etiology.  He states he is an exdrinker, and has been clean for 15 years.  Also has elevated creatinine from baseline.  7 months ago creatinine was 1.7.  Today it is only minimally higher.  Patient will require admission for further treatment since he has ongoing pain in the abdomen, with nausea vomiting.  He would likely decompensate if he was discharged.  Gastroenterology consulted, anticipate admission to hospitalist service.  CRITICAL CARE- No Performed by: Daleen Bo  Nursing Notes Reviewed/ Care Coordinated Applicable Imaging Reviewed Interpretation of Laboratory Data incorporated into ED treatment  7:22 PM-Consult complete with hospitalist. Patient case explained and discussed.  She agrees to admit patient for further evaluation and treatment. Call ended at 7:44 PM  Plan: Admit    Eulis Foster,  Vira Agar, MD 02/25/19 2202
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
## 2019-02-25 NOTE — ED Triage Notes (Signed)
Pt bib ashrand after going to [facility]. Then went to [facility] for abd pain and emesis. Hx cirrhosis. Pt jaundiced. Given phenergan and toradal pta. 300cc NS given.  160/82 HR 90 98% RA

## 2019-02-26 ENCOUNTER — Inpatient Hospital Stay (HOSPITAL_COMMUNITY): Payer: Medicare HMO

## 2019-02-26 DIAGNOSIS — B179 Acute viral hepatitis, unspecified: Secondary | ICD-10-CM

## 2019-02-26 DIAGNOSIS — N183 Chronic kidney disease, stage 3 unspecified: Secondary | ICD-10-CM

## 2019-02-26 DIAGNOSIS — D689 Coagulation defect, unspecified: Secondary | ICD-10-CM

## 2019-02-26 DIAGNOSIS — J438 Other emphysema: Secondary | ICD-10-CM

## 2019-02-26 LAB — CBC
HCT: 35.8 % — ABNORMAL LOW (ref 39.0–52.0)
Hemoglobin: 12.5 g/dL — ABNORMAL LOW (ref 13.0–17.0)
MCH: 30.3 pg (ref 26.0–34.0)
MCHC: 34.9 g/dL (ref 30.0–36.0)
MCV: 86.9 fL (ref 80.0–100.0)
Platelets: 126 10*3/uL — ABNORMAL LOW (ref 150–400)
RBC: 4.12 MIL/uL — ABNORMAL LOW (ref 4.22–5.81)
RDW: 17.2 % — ABNORMAL HIGH (ref 11.5–15.5)
WBC: 6.3 10*3/uL (ref 4.0–10.5)
nRBC: 0 % (ref 0.0–0.2)

## 2019-02-26 LAB — URINALYSIS, ROUTINE W REFLEX MICROSCOPIC
Glucose, UA: NEGATIVE mg/dL
Hgb urine dipstick: NEGATIVE
Ketones, ur: NEGATIVE mg/dL
Leukocytes,Ua: NEGATIVE
Nitrite: NEGATIVE
Protein, ur: NEGATIVE mg/dL
Specific Gravity, Urine: 1.017 (ref 1.005–1.030)
pH: 6 (ref 5.0–8.0)

## 2019-02-26 LAB — COMPREHENSIVE METABOLIC PANEL
ALT: 909 U/L — ABNORMAL HIGH (ref 0–44)
AST: 836 U/L — ABNORMAL HIGH (ref 15–41)
Albumin: 2.7 g/dL — ABNORMAL LOW (ref 3.5–5.0)
Alkaline Phosphatase: 97 U/L (ref 38–126)
Anion gap: 9 (ref 5–15)
BUN: 30 mg/dL — ABNORMAL HIGH (ref 6–20)
CO2: 21 mmol/L — ABNORMAL LOW (ref 22–32)
Calcium: 8.2 mg/dL — ABNORMAL LOW (ref 8.9–10.3)
Chloride: 104 mmol/L (ref 98–111)
Creatinine, Ser: 2.15 mg/dL — ABNORMAL HIGH (ref 0.61–1.24)
GFR calc Af Amer: 40 mL/min — ABNORMAL LOW (ref >60)
GFR calc non Af Amer: 34 mL/min — ABNORMAL LOW (ref >60)
Glucose, Bld: 118 mg/dL — ABNORMAL HIGH (ref 70–99)
Potassium: 3.9 mmol/L (ref 3.5–5.1)
Sodium: 134 mmol/L — ABNORMAL LOW (ref 135–145)
Total Bilirubin: 14.7 mg/dL — ABNORMAL HIGH (ref 0.3–1.2)
Total Protein: 6.7 g/dL (ref 6.5–8.1)

## 2019-02-26 LAB — SARS CORONAVIRUS 2 (TAT 6-24 HRS): SARS Coronavirus 2: NEGATIVE

## 2019-02-26 LAB — SALICYLATE LEVEL: Salicylate Lvl: 8.8 mg/dL (ref 7.0–30.0)

## 2019-02-26 LAB — HIV ANTIBODY (ROUTINE TESTING W REFLEX): HIV Screen 4th Generation wRfx: NONREACTIVE

## 2019-02-26 MED ORDER — LACTULOSE 10 GM/15ML PO SOLN
10.0000 g | Freq: Three times a day (TID) | ORAL | Status: DC
  Administered 2019-02-26 – 2019-03-01 (×8): 10 g via ORAL
  Filled 2019-02-26 (×10): qty 15

## 2019-02-26 MED ORDER — MOMETASONE FURO-FORMOTEROL FUM 200-5 MCG/ACT IN AERO
2.0000 | INHALATION_SPRAY | Freq: Two times a day (BID) | RESPIRATORY_TRACT | Status: DC
  Administered 2019-02-26 – 2019-03-01 (×6): 2 via RESPIRATORY_TRACT
  Filled 2019-02-26: qty 8.8

## 2019-02-26 MED ORDER — LIDOCAINE HCL 1 % IJ SOLN
INTRAMUSCULAR | Status: AC
  Filled 2019-02-26: qty 20

## 2019-02-26 MED ORDER — ENSURE ENLIVE PO LIQD
237.0000 mL | Freq: Three times a day (TID) | ORAL | Status: DC
  Administered 2019-02-26 – 2019-02-28 (×7): 237 mL via ORAL
  Filled 2019-02-26: qty 237

## 2019-02-26 MED ORDER — CHLORTHALIDONE 25 MG PO TABS
25.0000 mg | ORAL_TABLET | Freq: Every day | ORAL | Status: DC
  Administered 2019-02-26: 25 mg via ORAL
  Filled 2019-02-26: qty 1

## 2019-02-26 MED ORDER — ATENOLOL 50 MG PO TABS
50.0000 mg | ORAL_TABLET | Freq: Every day | ORAL | Status: DC
  Administered 2019-02-26 – 2019-03-01 (×4): 50 mg via ORAL
  Filled 2019-02-26 (×4): qty 1

## 2019-02-26 MED ORDER — AMLODIPINE BESYLATE 10 MG PO TABS
10.0000 mg | ORAL_TABLET | Freq: Every day | ORAL | Status: DC
  Administered 2019-02-26: 10 mg via ORAL
  Filled 2019-02-26: qty 1

## 2019-02-26 MED ORDER — ATENOLOL-CHLORTHALIDONE 50-25 MG PO TABS: 1.0000 | ORAL_TABLET | Freq: Every day | ORAL | Status: DC

## 2019-02-26 NOTE — Plan of Care (Addendum)
Pt noncompliant with tele box. Offered gown to hold box while ambulating in room. Pt refused. Takes of box whenever. Educated on importance & instructed to not take off.   Problem: Education: Goal: Knowledge of General Education information will improve Description: Including pain rating scale, medication(s)/side effects and non-pharmacologic comfort measures Outcome: Progressing   Problem: Safety: Goal: Ability to remain free from injury will improve Outcome: Progressing

## 2019-02-26 NOTE — Progress Notes (Signed)
PROGRESS NOTE  Glenn Alvarado YCX:[id number] DOB: April 30, 1965 DOA: 02/25/2019 PCP: Patient, No Pcp Per   HPI: Glenn Alvarado is a 53 y.o. 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  Symptoms started 3 days ago. Thought maybe it was due to spices from a pumpkin pie he ate. Also noted orange colored urine. Went to urgent care today and was sent to ED due to jaundice.  He is followed outpatient by GI at [facility].  Last alcohol use 14 years ago. Smokes about a pack a week. Denies illicit drug use.   ED Course: He was afebrile and mildly hypertensive up to 148/99 on room air.  No leukocytosis or anemia. Glucose of 119. Creatinine of 2.15 from a prior of 1.72 seven months ago. AST of 1274, ALT of 1237, total bilirubin of 19.2. PT 17 and INR of 1.4. CT shows cirrhosis with evidence of portal hypertension including splenomegaly and upper abdominal varices.  Cholelithiasis.  No bowel stricturing or active inflammation.  ED physician discussed with GI who recommended admission for symptomatic management and will consult in the morning.    HPI/Recap of past 24 hours:  C/o right sided pain, no vomiting observed since transferred to floor Reports urine is dark  Assessment/Plan: Principal Problem:   Acute hepatitis Active Problems:   Essential hypertension   COPD (chronic obstructive pulmonary disease) ([name])   Decompensated hepatic cirrhosis ([name])  Transaminitis/hyperbilirubinemia due to possible acute on chronic hepatitis vs decompensated cirrhosis Also has gallstone with distended gallbladder, though stable appearance compare to ct ab obtained from 07/2018 lft trending down -[facility] consulted, will follow recommendation  Cirrhosis with varices, 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  DVT Prophylaxis: will d/c lovenox, start scd's for now There is concerns of bleeding, he does has mild coagulopathy with chronically elevated INR  Code Status: full Family Communication: patient   Disposition Plan: not ready to dischage   Consultants:  [facility] Dr. Michail Sermon  Procedures:  none  Antibiotics:  none   Objective: BP 129/84 (BP Location: Right Arm)   Pulse 92   Temp 98.6 F (37 C) (Oral)   Resp 16   Ht 5' 8"  (1.727 m)   Wt 80.7 kg   SpO2 98%   BMI 27.06 kg/m   Intake/Output Summary (Last 24 hours) at 02/26/2019 1131 Last data filed at 02/26/2019 0130 Gross per 24 hour  Intake 200 ml  Output --  Net 200 ml   Filed Weights   02/25/19 1435  Weight: 80.7 kg    Exam: Patient is examined daily including today on 02/26/2019, exams remain the same as of yesterday except that has changed    General: Jaundice , appear weak , not in acute distress   Cardiovascular: RRR  Respiratory: CTABL  Abdomen: Distended , mild tender , no guarding , no rebound , positive BS, reducible umbilical hernia  Musculoskeletal: No Edema  Neuro: alert, oriented x3  Data Reviewed: Basic Metabolic Panel: Recent Labs  Lab 02/25/19 1411 02/25/19 1435 02/25/19 1502 02/26/19 0145  NA 136 138  --  134*  K 4.5 4.4  --  3.9  CL 105  --   --  104  CO2 18*  --   --  21*  GLUCOSE 119*  --   --  118*  BUN 26*  --   --  30*  CREATININE 2.15*  --  2.00* 2.15*  CALCIUM 9.2  --   --  8.2*    Liver Function Tests: Recent Labs  Lab 02/25/19 1411 02/26/19 0145  AST 1,274* 836*  ALT 1,237* 909*  ALKPHOS 114 97  BILITOT 19.2* 14.7*  PROT 8.8* 6.7  ALBUMIN 3.5 2.7*   Recent Labs  Lab 02/25/19 1411  LIPASE 30   No results for input(s): AMMONIA in the last 168 hours. CBC: Recent Labs  Lab 02/25/19 1411 02/25/19 1435 02/26/19 0145  WBC 5.7  --  6.3  NEUTROABS 3.6  --   --   HGB 15.5 16.7 12.5*  HCT 45.5 49.0 35.8*  MCV 89.6  --  86.9  PLT 148*  --  126*   Cardiac Enzymes:   No results for input(s): CKTOTAL, CKMB, CKMBINDEX, TROPONINI in the last 168 hours. BNP (last 3 results) No results for input(s): BNP in the last 8760 hours.  ProBNP (last 3 results) No results for input(s): PROBNP in the last 8760 hours.  CBG: No results for input(s): GLUCAP in the last 168 hours.  Recent Results (from the past 240 hour(s))  SARS CORONAVIRUS 2 (TAT 6-24 HRS) Nasopharyngeal Nasopharyngeal Swab     Status: None   Collection Time: 02/25/19  7:34 PM   Specimen: Nasopharyngeal Swab  Result Value Ref Range Status   SARS Coronavirus 2 NEGATIVE NEGATIVE Final    Comment: (NOTE) SARS-CoV-2 target nucleic acids are NOT DETECTED. The SARS-CoV-2 RNA is generally detectable in upper and lower respiratory specimens during the acute phase of infection. Negative results do not preclude SARS-CoV-2 infection, do not rule out co-infections with other pathogens, and should not be used as the sole basis for treatment or other patient management decisions. Negative results must be combined with clinical observations, patient history, and epidemiological information. The expected result is Negative. Fact Sheet for Patients: SugarRoll.be Fact Sheet for Healthcare Providers: https://www.woods-mathews.com/ This test is not yet approved or cleared by the [place] FDA and  has been authorized for detection and/or diagnosis of SARS-CoV-2  by FDA under an Emergency Use Authorization (EUA). This EUA will remain  in effect (meaning this test can be used) for the duration of the COVID-19 declaration under Section 56 4(b)(1) of the Act, 21 U.S.C. section 360bbb-3(b)(1), unless the authorization is terminated or revoked sooner. Performed at [facility], [place] [address], [place], [place] [address]      Studies: CT ABDOMEN PELVIS WO CONTRAST  Result Date: 02/25/2019 CLINICAL DATA:  53 year old male with abdominal pain and vomiting and jaundice. History of cirrhosis. EXAM: CT ABDOMEN AND PELVIS WITHOUT CONTRAST TECHNIQUE: Multidetector CT imaging of the abdomen and pelvis was performed following the standard protocol without IV contrast. COMPARISON:  CT of the abdomen pelvis dated 07/24/2018. FINDINGS: Evaluation of this exam is limited in the absence of intravenous contrast. Lower chest: Mild emphysema. The visualized lung bases are clear. No intra-abdominal free air or free fluid. Hepatobiliary: Cirrhosis. The gallbladder is distended. There is a 2 cm rim calcified stone in the neck  of the gallbladder. No pericholecystic fluid. Pancreas: The pancreas is unremarkable as visualized. Spleen: Splenomegaly measuring 15 cm in craniocaudal length (previously 14 cm). Adrenals/Urinary Tract: The adrenal glands are unremarkable. Vascular calcification versus less likely nonobstructing bilateral renal calculi measuring up to 5 mm in the left kidney. There is no hydronephrosis on either side. The visualized ureters and urinary bladder appear unremarkable. Stomach/Bowel: There is no bowel obstruction or active inflammation. The appendix is normal. Vascular/Lymphatic: There is advanced aortoiliac atherosclerotic disease. No portal venous gas. Upper abdominal varices noted. Reproductive: The prostate and seminal vesicles are grossly unremarkable. No pelvic mass. Other: There is a fat containing umbilical hernia similar to prior CT. No fluid  collection. Musculoskeletal: No acute or significant osseous findings. IMPRESSION: 1. Cirrhosis with evidence of portal hypertension including splenomegaly and upper abdominal varices. 2. Cholelithiasis. Distended appearance of the gallbladder similar to prior CT. No intrahepatic biliary ductal dilatation or interval change in the diameter of the CBD since the prior CT. 3. No bowel obstruction or active inflammation. Normal appendix. 4. Aortic Atherosclerosis (ICD10-I70.0). Electronically Signed   By: Anner Crete M.D.   On: 02/25/2019 17:54    Scheduled Meds: . amLODipine  10 mg Oral Daily  . atenolol  50 mg Oral Daily   And  . chlorthalidone  25 mg Oral Daily  . enoxaparin (LOVENOX) injection  40 mg Subcutaneous Q24H  . feeding supplement (ENSURE ENLIVE)  237 mL Oral TID BM  . lactulose  10 g Oral TID  . mometasone-formoterol  2 puff Inhalation BID    Continuous Infusions:   Time spent: 83mns I have personally reviewed and interpreted on  02/26/2019 daily labs, tele strips, imagings as discussed above under date review session and assessment and plans.  I reviewed all nursing notes, pharmacy notes, consultant notes,  vitals, pertinent old records  I have discussed plan of care as described above with RN , patient and family on 02/26/2019   FFlorencia ReasonsMD, PhD, FACP  [facility] Pager [phone] If 7PM-7AM, please contact night-coverage at [email or web address], password [email or web address]12/23/2020, 11:31 AM  LOS: 1 day

## 2019-02-26 NOTE — Plan of Care (Signed)
  Problem: Education: Goal: Knowledge of General Education information will improve Description: Including pain rating scale, medication(s)/side effects and non-pharmacologic comfort measures Outcome: Progressing   Problem: Pain Managment: Goal: General experience of comfort will improve Outcome: Progressing   

## 2019-02-26 NOTE — Progress Notes (Signed)
Patient brought to radiology for possible paracentesis.   Limited US Abdomen reveals a large, distended gall bladder but no ascites; refer to dictation for further comment, if any.   Called to MD office to notify of findings.  No procedure performed.  Patient returned to unit.   Brynda Greathouse, MS RD PA-C 2:06 PM
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
## 2019-02-27 ENCOUNTER — Inpatient Hospital Stay (HOSPITAL_COMMUNITY): Payer: Medicare HMO

## 2019-02-27 LAB — URINE CULTURE: Culture: NO GROWTH

## 2019-02-27 LAB — CBC WITH DIFFERENTIAL/PLATELET
Abs Immature Granulocytes: 0.02 10*3/uL (ref 0.00–0.07)
Basophils Absolute: 0 10*3/uL (ref 0.0–0.1)
Basophils Relative: 1 %
Eosinophils Absolute: 0.1 10*3/uL (ref 0.0–0.5)
Eosinophils Relative: 3 %
HCT: 34.5 % — ABNORMAL LOW (ref 39.0–52.0)
Hemoglobin: 12 g/dL — ABNORMAL LOW (ref 13.0–17.0)
Immature Granulocytes: 0 %
Lymphocytes Relative: 31 %
Lymphs Abs: 1.5 10*3/uL (ref 0.7–4.0)
MCH: 29.8 pg (ref 26.0–34.0)
MCHC: 34.8 g/dL (ref 30.0–36.0)
MCV: 85.6 fL (ref 80.0–100.0)
Monocytes Absolute: 0.7 10*3/uL (ref 0.1–1.0)
Monocytes Relative: 14 %
Neutro Abs: 2.5 10*3/uL (ref 1.7–7.7)
Neutrophils Relative %: 51 %
Platelets: 118 10*3/uL — ABNORMAL LOW (ref 150–400)
RBC: 4.03 MIL/uL — ABNORMAL LOW (ref 4.22–5.81)
RDW: 17.3 % — ABNORMAL HIGH (ref 11.5–15.5)
WBC: 4.8 10*3/uL (ref 4.0–10.5)
nRBC: 0 % (ref 0.0–0.2)

## 2019-02-27 LAB — COMPREHENSIVE METABOLIC PANEL
ALT: 717 U/L — ABNORMAL HIGH (ref 0–44)
AST: 612 U/L — ABNORMAL HIGH (ref 15–41)
Albumin: 2.6 g/dL — ABNORMAL LOW (ref 3.5–5.0)
Alkaline Phosphatase: 92 U/L (ref 38–126)
Anion gap: 6 (ref 5–15)
BUN: 28 mg/dL — ABNORMAL HIGH (ref 6–20)
CO2: 24 mmol/L (ref 22–32)
Calcium: 8.1 mg/dL — ABNORMAL LOW (ref 8.9–10.3)
Chloride: 102 mmol/L (ref 98–111)
Creatinine, Ser: 2.12 mg/dL — ABNORMAL HIGH (ref 0.61–1.24)
GFR calc Af Amer: 40 mL/min — ABNORMAL LOW (ref >60)
GFR calc non Af Amer: 35 mL/min — ABNORMAL LOW (ref >60)
Glucose, Bld: 96 mg/dL (ref 70–99)
Potassium: 4.6 mmol/L (ref 3.5–5.1)
Sodium: 132 mmol/L — ABNORMAL LOW (ref 135–145)
Total Bilirubin: 15.3 mg/dL — ABNORMAL HIGH (ref 0.3–1.2)
Total Protein: 6.7 g/dL (ref 6.5–8.1)

## 2019-02-27 LAB — HEPATITIS PANEL, ACUTE
HCV Ab: REACTIVE — AB
Hep A IgM: REACTIVE — AB
Hep B C IgM: NONREACTIVE
Hepatitis B Surface Ag: NONREACTIVE

## 2019-02-27 LAB — AMMONIA: Ammonia: 42 umol/L — ABNORMAL HIGH (ref 9–35)

## 2019-02-27 LAB — HEPATITIS B SURFACE ANTIBODY, QUANTITATIVE: Hep B S AB Quant (Post): <3.1 m[IU]/mL — ABNORMAL LOW (ref >9.9)

## 2019-02-27 LAB — LIPASE, BLOOD: Lipase: 33 U/L (ref 11–51)

## 2019-02-27 LAB — TSH: TSH: 25.577 u[IU]/mL — ABNORMAL HIGH (ref 0.350–4.500)

## 2019-02-27 LAB — AFP TUMOR MARKER: AFP, Serum, Tumor Marker: 9 ng/mL — ABNORMAL HIGH (ref 0.0–8.3)

## 2019-02-27 LAB — HEPATITIS B E ANTIGEN: Hep B E Ag: NEGATIVE

## 2019-02-27 LAB — PROTIME-INR
INR: 1.5 — ABNORMAL HIGH (ref 0.8–1.2)
Prothrombin Time: 18.3 seconds — ABNORMAL HIGH (ref 11.4–15.2)

## 2019-02-27 LAB — MRSA PCR SCREENING: MRSA by PCR: POSITIVE — AB

## 2019-02-27 MED ORDER — POLYVINYL ALCOHOL 1.4 % OP SOLN: 1.0000 [drp] | OPHTHALMIC | Status: DC | PRN

## 2019-02-27 MED ORDER — SALINE SPRAY 0.65 % NA SOLN: 1.0000 | NASAL | Status: DC | PRN

## 2019-02-27 MED ORDER — NICOTINE 21 MG/24HR TD PT24
21.0000 mg | MEDICATED_PATCH | Freq: Every day | TRANSDERMAL | Status: DC
  Administered 2019-02-27 – 2019-03-01 (×3): 21 mg via TRANSDERMAL
  Filled 2019-02-27 (×3): qty 1

## 2019-02-27 MED ORDER — LIP MEDEX EX OINT: 1.0000 "application " | TOPICAL_OINTMENT | CUTANEOUS | Status: DC | PRN

## 2019-02-27 MED ORDER — HYDROCORTISONE (PERIANAL) 2.5 % EX CREA: 1.0000 "application " | TOPICAL_CREAM | Freq: Four times a day (QID) | CUTANEOUS | Status: DC | PRN

## 2019-02-27 MED ORDER — MUSCLE RUB 10-15 % EX CREA
1.0000 "application " | TOPICAL_CREAM | CUTANEOUS | Status: DC | PRN
  Filled 2019-02-27: qty 85

## 2019-02-27 MED ORDER — IBUPROFEN 200 MG PO TABS
400.0000 mg | ORAL_TABLET | Freq: Four times a day (QID) | ORAL | Status: DC | PRN
  Administered 2019-02-27: 400 mg via ORAL
  Filled 2019-02-27: qty 2

## 2019-02-27 MED ORDER — TECHNETIUM TC 99M MEBROFENIN IV KIT
8.0000 | PACK | Freq: Once | INTRAVENOUS | Status: AC | PRN
  Administered 2019-02-27: 8 via INTRAVENOUS

## 2019-02-27 MED ORDER — LIDOCAINE 5 % EX PTCH
1.0000 | MEDICATED_PATCH | CUTANEOUS | Status: DC
  Filled 2019-02-27 (×2): qty 1
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
## 2019-02-27 NOTE — Plan of Care (Signed)

## 2019-02-27 NOTE — Progress Notes (Signed)
[facility] Progress Note  Glenn Alvarado 53 y.o. Apr 08, 1965   Subjective: Complaining of abdominal pain. Father at bedside.  Objective: Vital signs: Vitals:   02/27/19 0750 02/27/19 0911  BP:  111/66  Pulse: 72 79  Resp: 16 17  Temp:  98.6 F (37 C)  SpO2: 95% 97%    Physical Exam: Gen: alert, no acute distress, jaundice HEENT: +icteric sclera CV: RRR Chest: CTA B Abd: diffuse abdominal tenderness (R > L) with guarding, mild distention, umbilical hernia, +BS Ext: no edema  Lab Results: Recent Labs    02/26/19 0145 02/27/19 0303  NA 134* 132*  K 3.9 4.6  CL 104 102  CO2 21* 24  GLUCOSE 118* 96  BUN 30* 28*  CREATININE 2.15* 2.12*  CALCIUM 8.2* 8.1*   Recent Labs    02/26/19 0145 02/27/19 0303  AST 836* 612*  ALT 909* 717*  ALKPHOS 97 92  BILITOT 14.7* 15.3*  PROT 6.7 6.7  ALBUMIN 2.7* 2.6*   Recent Labs    02/25/19 1411 02/26/19 0145 02/27/19 0303  WBC 5.7 6.3 4.8  NEUTROABS 3.6  --  2.5  HGB 15.5 12.5* 12.0*  HCT 45.5 35.8* 34.5*  MCV 89.6 86.9 85.6  PLT 148* 126* 118*      Assessment/Plan: Decompensated cirrhosis likely due to alcohol (reports none in 15 years); [name] serologies negative for active [name]. Hep C RNA level pending. No ascites on U/S. GB dilated but likely reactive from hepatitis. LFTs improving. Ammonia slightly elevated at 42 but no clinical sign of encephalopathy. Continue supportive care. Low sodium diet. Dr. Watt Climes on call tomorrow and this weekend if questions arise and he will f/u Saturday unless urgent need arises tomorrow.   Lear Ng 02/27/2019, 10:39 AM  Questions please call [phone] ID: Wynona Neat, male   DOB: 02-28-1966, 53 y.o.   MRN: [id number]

## 2019-02-28 ENCOUNTER — Encounter (HOSPITAL_COMMUNITY): Payer: Self-pay | Admitting: Family Medicine

## 2019-02-28 LAB — CBC WITH DIFFERENTIAL/PLATELET
Abs Immature Granulocytes: 0.03 10*3/uL (ref 0.00–0.07)
Basophils Absolute: 0 10*3/uL (ref 0.0–0.1)
Basophils Relative: 0 %
Eosinophils Absolute: 0 10*3/uL (ref 0.0–0.5)
Eosinophils Relative: 0 %
HCT: 34.1 % — ABNORMAL LOW (ref 39.0–52.0)
Hemoglobin: 12.3 g/dL — ABNORMAL LOW (ref 13.0–17.0)
Immature Granulocytes: 1 %
Lymphocytes Relative: 21 %
Lymphs Abs: 0.8 10*3/uL (ref 0.7–4.0)
MCH: 30.4 pg (ref 26.0–34.0)
MCHC: 36.1 g/dL — ABNORMAL HIGH (ref 30.0–36.0)
MCV: 84.4 fL (ref 80.0–100.0)
Monocytes Absolute: 0.5 10*3/uL (ref 0.1–1.0)
Monocytes Relative: 13 %
Neutro Abs: 2.6 10*3/uL (ref 1.7–7.7)
Neutrophils Relative %: 65 %
Platelets: 100 10*3/uL — ABNORMAL LOW (ref 150–400)
RBC: 4.04 MIL/uL — ABNORMAL LOW (ref 4.22–5.81)
RDW: 17.2 % — ABNORMAL HIGH (ref 11.5–15.5)
WBC: 3.9 10*3/uL — ABNORMAL LOW (ref 4.0–10.5)
nRBC: 0 % (ref 0.0–0.2)

## 2019-02-28 LAB — COMPREHENSIVE METABOLIC PANEL
ALT: 534 U/L — ABNORMAL HIGH (ref 0–44)
AST: 446 U/L — ABNORMAL HIGH (ref 15–41)
Albumin: 2.3 g/dL — ABNORMAL LOW (ref 3.5–5.0)
Alkaline Phosphatase: 91 U/L (ref 38–126)
Anion gap: 9 (ref 5–15)
BUN: 26 mg/dL — ABNORMAL HIGH (ref 6–20)
CO2: 20 mmol/L — ABNORMAL LOW (ref 22–32)
Calcium: 8.5 mg/dL — ABNORMAL LOW (ref 8.9–10.3)
Chloride: 107 mmol/L (ref 98–111)
Creatinine, Ser: 1.49 mg/dL — ABNORMAL HIGH (ref 0.61–1.24)
GFR calc Af Amer: >60 mL/min (ref >60)
GFR calc non Af Amer: 53 mL/min — ABNORMAL LOW (ref >60)
Glucose, Bld: 104 mg/dL — ABNORMAL HIGH (ref 70–99)
Potassium: 3.8 mmol/L (ref 3.5–5.1)
Sodium: 136 mmol/L (ref 135–145)
Total Bilirubin: 15.4 mg/dL — ABNORMAL HIGH (ref 0.3–1.2)
Total Protein: 6.4 g/dL — ABNORMAL LOW (ref 6.5–8.1)

## 2019-02-28 MED ORDER — PROMETHAZINE HCL 25 MG/ML IJ SOLN
25.0000 mg | Freq: Four times a day (QID) | INTRAMUSCULAR | Status: AC | PRN
  Administered 2019-02-28: 25 mg via INTRAVENOUS
  Filled 2019-02-28: qty 1
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
## 2019-03-01 LAB — CBC WITH DIFFERENTIAL/PLATELET
Abs Immature Granulocytes: 0.05 10*3/uL (ref 0.00–0.07)
Basophils Absolute: 0 10*3/uL (ref 0.0–0.1)
Basophils Relative: 1 %
Eosinophils Absolute: 0.1 10*3/uL (ref 0.0–0.5)
Eosinophils Relative: 2 %
HCT: 39.1 % (ref 39.0–52.0)
Hemoglobin: 14.2 g/dL (ref 13.0–17.0)
Immature Granulocytes: 1 %
Lymphocytes Relative: 21 %
Lymphs Abs: 1.2 10*3/uL (ref 0.7–4.0)
MCH: 30.6 pg (ref 26.0–34.0)
MCHC: 36.3 g/dL — ABNORMAL HIGH (ref 30.0–36.0)
MCV: 84.3 fL (ref 80.0–100.0)
Monocytes Absolute: 0.7 10*3/uL (ref 0.1–1.0)
Monocytes Relative: 11 %
Neutro Abs: 3.9 10*3/uL (ref 1.7–7.7)
Neutrophils Relative %: 64 %
Platelets: 119 10*3/uL — ABNORMAL LOW (ref 150–400)
RBC: 4.64 MIL/uL (ref 4.22–5.81)
RDW: 18.3 % — ABNORMAL HIGH (ref 11.5–15.5)
WBC: 6 10*3/uL (ref 4.0–10.5)
nRBC: 0 % (ref 0.0–0.2)

## 2019-03-01 LAB — COMPREHENSIVE METABOLIC PANEL
ALT: 537 U/L — ABNORMAL HIGH (ref 0–44)
AST: 484 U/L — ABNORMAL HIGH (ref 15–41)
Albumin: 2.6 g/dL — ABNORMAL LOW (ref 3.5–5.0)
Alkaline Phosphatase: 97 U/L (ref 38–126)
Anion gap: 10 (ref 5–15)
BUN: 24 mg/dL — ABNORMAL HIGH (ref 6–20)
CO2: 19 mmol/L — ABNORMAL LOW (ref 22–32)
Calcium: 8.4 mg/dL — ABNORMAL LOW (ref 8.9–10.3)
Chloride: 104 mmol/L (ref 98–111)
Creatinine, Ser: 1.48 mg/dL — ABNORMAL HIGH (ref 0.61–1.24)
GFR calc Af Amer: >60 mL/min (ref >60)
GFR calc non Af Amer: 54 mL/min — ABNORMAL LOW (ref >60)
Glucose, Bld: 92 mg/dL (ref 70–99)
Potassium: 4.4 mmol/L (ref 3.5–5.1)
Sodium: 133 mmol/L — ABNORMAL LOW (ref 135–145)
Total Bilirubin: 18.9 mg/dL (ref 0.3–1.2)
Total Protein: 7.5 g/dL (ref 6.5–8.1)

## 2019-03-01 LAB — HEPATITIS C VRS RNA DETECT BY PCR-QUAL: Hepatitis C Vrs RNA by PCR-Qual: NEGATIVE

## 2019-03-01 MED ORDER — OXYCODONE HCL 10 MG PO TABS: 10.0000 mg | ORAL_TABLET | ORAL | 0 refills | Status: AC | PRN

## 2019-03-01 MED ORDER — PROPRANOLOL HCL 40 MG PO TABS: 40.0000 mg | ORAL_TABLET | Freq: Two times a day (BID) | ORAL | 0 refills | Status: AC

## 2019-03-01 MED ORDER — LACTULOSE 10 GM/15ML PO SOLN: 10.0000 g | Freq: Three times a day (TID) | ORAL | 0 refills | Status: AC

## 2019-03-01 NOTE — Plan of Care (Signed)

## 2019-03-01 NOTE — Progress Notes (Signed)
13:40 - Patient discharged.  Discharge instructions were reviewed and all answers were answered.  Prescription for pain medication was put into envelope for patient along with his discharge instructions.  Patient was transported to vehicle in a wheelchair by nurse
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
## 2019-03-02 ENCOUNTER — Other Ambulatory Visit: Payer: Self-pay

## 2019-03-02 ENCOUNTER — Emergency Department (HOSPITAL_COMMUNITY): Payer: Medicare HMO

## 2019-03-02 ENCOUNTER — Encounter (HOSPITAL_COMMUNITY): Payer: Self-pay | Admitting: Emergency Medicine

## 2019-03-02 ENCOUNTER — Inpatient Hospital Stay (HOSPITAL_COMMUNITY)
Admission: EM | Admit: 2019-03-02 | Discharge: 2019-04-07 | DRG: 441 | Disposition: E | Payer: Medicare HMO | Attending: Family Medicine | Admitting: Family Medicine

## 2019-03-02 DIAGNOSIS — I469 Cardiac arrest, cause unspecified: Secondary | ICD-10-CM

## 2019-03-02 DIAGNOSIS — D62 Acute posthemorrhagic anemia: Secondary | ICD-10-CM | POA: Diagnosis not present

## 2019-03-02 DIAGNOSIS — F1011 Alcohol abuse, in remission: Secondary | ICD-10-CM | POA: Diagnosis present

## 2019-03-02 DIAGNOSIS — I129 Hypertensive chronic kidney disease with stage 1 through stage 4 chronic kidney disease, or unspecified chronic kidney disease: Secondary | ICD-10-CM | POA: Diagnosis present

## 2019-03-02 DIAGNOSIS — E872 Acidosis, unspecified: Secondary | ICD-10-CM | POA: Diagnosis present

## 2019-03-02 DIAGNOSIS — N179 Acute kidney failure, unspecified: Secondary | ICD-10-CM | POA: Diagnosis not present

## 2019-03-02 DIAGNOSIS — K7581 Nonalcoholic steatohepatitis (NASH): Secondary | ICD-10-CM | POA: Diagnosis present

## 2019-03-02 DIAGNOSIS — K7200 Acute and subacute hepatic failure without coma: Secondary | ICD-10-CM | POA: Diagnosis not present

## 2019-03-02 DIAGNOSIS — K8000 Calculus of gallbladder with acute cholecystitis without obstruction: Secondary | ICD-10-CM | POA: Diagnosis present

## 2019-03-02 DIAGNOSIS — Z20822 Contact with and (suspected) exposure to covid-19: Secondary | ICD-10-CM | POA: Diagnosis present

## 2019-03-02 DIAGNOSIS — Z7951 Long term (current) use of inhaled steroids: Secondary | ICD-10-CM

## 2019-03-02 DIAGNOSIS — K7290 Hepatic failure, unspecified without coma: Secondary | ICD-10-CM

## 2019-03-02 DIAGNOSIS — K7682 Hepatic encephalopathy: Secondary | ICD-10-CM

## 2019-03-02 DIAGNOSIS — B181 Chronic viral hepatitis B without delta-agent: Secondary | ICD-10-CM | POA: Diagnosis present

## 2019-03-02 DIAGNOSIS — B179 Acute viral hepatitis, unspecified: Secondary | ICD-10-CM | POA: Diagnosis not present

## 2019-03-02 DIAGNOSIS — Z811 Family history of alcohol abuse and dependence: Secondary | ICD-10-CM

## 2019-03-02 DIAGNOSIS — G9341 Metabolic encephalopathy: Secondary | ICD-10-CM | POA: Diagnosis present

## 2019-03-02 DIAGNOSIS — D696 Thrombocytopenia, unspecified: Secondary | ICD-10-CM | POA: Diagnosis not present

## 2019-03-02 DIAGNOSIS — I252 Old myocardial infarction: Secondary | ICD-10-CM

## 2019-03-02 DIAGNOSIS — I10 Essential (primary) hypertension: Secondary | ICD-10-CM | POA: Diagnosis present

## 2019-03-02 DIAGNOSIS — I2510 Atherosclerotic heart disease of native coronary artery without angina pectoris: Secondary | ICD-10-CM | POA: Diagnosis present

## 2019-03-02 DIAGNOSIS — K8020 Calculus of gallbladder without cholecystitis without obstruction: Secondary | ICD-10-CM | POA: Diagnosis present

## 2019-03-02 DIAGNOSIS — Z4659 Encounter for fitting and adjustment of other gastrointestinal appliance and device: Secondary | ICD-10-CM

## 2019-03-02 DIAGNOSIS — R578 Other shock: Secondary | ICD-10-CM | POA: Diagnosis not present

## 2019-03-02 DIAGNOSIS — E875 Hyperkalemia: Secondary | ICD-10-CM | POA: Diagnosis not present

## 2019-03-02 DIAGNOSIS — J9601 Acute respiratory failure with hypoxia: Secondary | ICD-10-CM | POA: Diagnosis not present

## 2019-03-02 DIAGNOSIS — G9340 Encephalopathy, unspecified: Secondary | ICD-10-CM | POA: Diagnosis present

## 2019-03-02 DIAGNOSIS — K766 Portal hypertension: Secondary | ICD-10-CM | POA: Diagnosis present

## 2019-03-02 DIAGNOSIS — R188 Other ascites: Secondary | ICD-10-CM

## 2019-03-02 DIAGNOSIS — Z8349 Family history of other endocrine, nutritional and metabolic diseases: Secondary | ICD-10-CM

## 2019-03-02 DIAGNOSIS — Z978 Presence of other specified devices: Secondary | ICD-10-CM

## 2019-03-02 DIAGNOSIS — Z452 Encounter for adjustment and management of vascular access device: Secondary | ICD-10-CM

## 2019-03-02 DIAGNOSIS — E162 Hypoglycemia, unspecified: Secondary | ICD-10-CM | POA: Diagnosis not present

## 2019-03-02 DIAGNOSIS — K819 Cholecystitis, unspecified: Secondary | ICD-10-CM | POA: Diagnosis present

## 2019-03-02 DIAGNOSIS — J449 Chronic obstructive pulmonary disease, unspecified: Secondary | ICD-10-CM | POA: Diagnosis present

## 2019-03-02 DIAGNOSIS — K7460 Unspecified cirrhosis of liver: Secondary | ICD-10-CM | POA: Diagnosis present

## 2019-03-02 DIAGNOSIS — T82528A Displacement of other cardiac and vascular devices and implants, initial encounter: Secondary | ICD-10-CM

## 2019-03-02 DIAGNOSIS — B159 Hepatitis A without hepatic coma: Secondary | ICD-10-CM | POA: Diagnosis present

## 2019-03-02 DIAGNOSIS — R4182 Altered mental status, unspecified: Secondary | ICD-10-CM

## 2019-03-02 DIAGNOSIS — D684 Acquired coagulation factor deficiency: Secondary | ICD-10-CM | POA: Diagnosis present

## 2019-03-02 DIAGNOSIS — N183 Chronic kidney disease, stage 3 unspecified: Secondary | ICD-10-CM | POA: Diagnosis present

## 2019-03-02 DIAGNOSIS — E876 Hypokalemia: Secondary | ICD-10-CM | POA: Diagnosis not present

## 2019-03-02 DIAGNOSIS — I8511 Secondary esophageal varices with bleeding: Secondary | ICD-10-CM | POA: Diagnosis present

## 2019-03-02 DIAGNOSIS — K7030 Alcoholic cirrhosis of liver without ascites: Secondary | ICD-10-CM | POA: Diagnosis present

## 2019-03-02 DIAGNOSIS — Z781 Physical restraint status: Secondary | ICD-10-CM

## 2019-03-02 DIAGNOSIS — I472 Ventricular tachycardia: Secondary | ICD-10-CM | POA: Diagnosis not present

## 2019-03-02 DIAGNOSIS — F17210 Nicotine dependence, cigarettes, uncomplicated: Secondary | ICD-10-CM | POA: Diagnosis present

## 2019-03-02 DIAGNOSIS — E870 Hyperosmolality and hypernatremia: Secondary | ICD-10-CM | POA: Diagnosis present

## 2019-03-02 LAB — COMPREHENSIVE METABOLIC PANEL
ALT: 482 U/L — ABNORMAL HIGH (ref 0–44)
AST: 409 U/L — ABNORMAL HIGH (ref 15–41)
Albumin: 2.8 g/dL — ABNORMAL LOW (ref 3.5–5.0)
Alkaline Phosphatase: 115 U/L (ref 38–126)
Anion gap: 8 (ref 5–15)
BUN: 20 mg/dL (ref 6–20)
CO2: 20 mmol/L — ABNORMAL LOW (ref 22–32)
Calcium: 8.6 mg/dL — ABNORMAL LOW (ref 8.9–10.3)
Chloride: 103 mmol/L (ref 98–111)
Creatinine, Ser: 1.41 mg/dL — ABNORMAL HIGH (ref 0.61–1.24)
GFR calc Af Amer: >60 mL/min (ref >60)
GFR calc non Af Amer: 57 mL/min — ABNORMAL LOW (ref >60)
Glucose, Bld: 97 mg/dL (ref 70–99)
Potassium: 4.5 mmol/L (ref 3.5–5.1)
Sodium: 131 mmol/L — ABNORMAL LOW (ref 135–145)
Total Bilirubin: 25.8 mg/dL (ref 0.3–1.2)
Total Protein: 8.3 g/dL — ABNORMAL HIGH (ref 6.5–8.1)

## 2019-03-02 LAB — CBC
HCT: 42.2 % (ref 39.0–52.0)
Hemoglobin: 14.5 g/dL (ref 13.0–17.0)
MCH: 29.8 pg (ref 26.0–34.0)
MCHC: 34.4 g/dL (ref 30.0–36.0)
MCV: 86.8 fL (ref 80.0–100.0)
Platelets: 171 10*3/uL (ref 150–400)
RBC: 4.86 MIL/uL (ref 4.22–5.81)
RDW: 19.8 % — ABNORMAL HIGH (ref 11.5–15.5)
WBC: 9.5 10*3/uL (ref 4.0–10.5)
nRBC: 0 % (ref 0.0–0.2)

## 2019-03-02 LAB — AMMONIA: Ammonia: 43 umol/L — ABNORMAL HIGH (ref 9–35)

## 2019-03-02 LAB — PROTIME-INR
INR: 1.4 — ABNORMAL HIGH (ref 0.8–1.2)
Prothrombin Time: 17.3 seconds — ABNORMAL HIGH (ref 11.4–15.2)

## 2019-03-02 LAB — ETHANOL: Alcohol, Ethyl (B): <10 mg/dL (ref <10)

## 2019-03-02 LAB — APTT: aPTT: 46 seconds — ABNORMAL HIGH (ref 24–36)

## 2019-03-02 LAB — LIPASE, BLOOD: Lipase: 26 U/L (ref 11–51)

## 2019-03-02 MED ORDER — AMLODIPINE BESYLATE 10 MG PO TABS
10.0000 mg | ORAL_TABLET | Freq: Every day | ORAL | Status: DC
  Administered 2019-03-03: 10 mg via ORAL
  Filled 2019-03-02: qty 1

## 2019-03-02 MED ORDER — MOMETASONE FURO-FORMOTEROL FUM 200-5 MCG/ACT IN AERO
2.0000 | INHALATION_SPRAY | Freq: Two times a day (BID) | RESPIRATORY_TRACT | Status: DC
  Administered 2019-03-03: 2 via RESPIRATORY_TRACT
  Filled 2019-03-02: qty 8.8

## 2019-03-02 MED ORDER — MOMETASONE FURO-FORMOTEROL FUM 200-5 MCG/ACT IN AERO: 2.0000 | INHALATION_SPRAY | Freq: Two times a day (BID) | RESPIRATORY_TRACT | Status: DC

## 2019-03-02 MED ORDER — SODIUM CHLORIDE 0.9% FLUSH: 3.0000 mL | Freq: Once | INTRAVENOUS | Status: DC

## 2019-03-02 MED ORDER — LACTULOSE 10 GM/15ML PO SOLN
10.0000 g | Freq: Once | ORAL | Status: AC
  Administered 2019-03-02: 10 g via ORAL
  Filled 2019-03-02: qty 15

## 2019-03-02 MED ORDER — ONDANSETRON HCL 4 MG PO TABS: 4.0000 mg | ORAL_TABLET | Freq: Four times a day (QID) | ORAL | Status: DC | PRN

## 2019-03-02 MED ORDER — LACTULOSE 10 GM/15ML PO SOLN
30.0000 g | Freq: Three times a day (TID) | ORAL | Status: DC
  Administered 2019-03-03 (×2): 30 g via ORAL
  Filled 2019-03-02 (×2): qty 45

## 2019-03-02 MED ORDER — ENTECAVIR 0.5 MG PO TABS: 0.5000 mg | ORAL_TABLET | Freq: Every day | ORAL | Status: DC

## 2019-03-02 MED ORDER — ENSURE ENLIVE PO LIQD
237.0000 mL | Freq: Three times a day (TID) | ORAL | Status: DC
  Administered 2019-03-03: 237 mL via ORAL
  Filled 2019-03-02: qty 237

## 2019-03-02 MED ORDER — HEPARIN SODIUM (PORCINE) 5000 UNIT/ML IJ SOLN
5000.0000 [IU] | Freq: Three times a day (TID) | INTRAMUSCULAR | Status: DC
  Administered 2019-03-03: 5000 [IU] via SUBCUTANEOUS
  Filled 2019-03-02 (×4): qty 1

## 2019-03-02 MED ORDER — ONDANSETRON HCL 4 MG/2ML IJ SOLN
4.0000 mg | Freq: Four times a day (QID) | INTRAMUSCULAR | Status: DC | PRN
  Administered 2019-03-03 (×3): 4 mg via INTRAVENOUS
  Filled 2019-03-02 (×3): qty 2

## 2019-03-02 MED ORDER — RIFAXIMIN 550 MG PO TABS
550.0000 mg | ORAL_TABLET | Freq: Two times a day (BID) | ORAL | Status: DC
  Administered 2019-03-02 – 2019-03-03 (×2): 550 mg via ORAL
  Filled 2019-03-02 (×6): qty 1

## 2019-03-02 MED ORDER — PROPRANOLOL HCL 40 MG PO TABS
40.0000 mg | ORAL_TABLET | Freq: Two times a day (BID) | ORAL | Status: DC
  Administered 2019-03-03: 40 mg via ORAL
  Filled 2019-03-02 (×3): qty 1
  Filled 2019-03-02: qty 2
  Filled 2019-03-02: qty 1

## 2019-03-02 NOTE — ED Notes (Signed)
Patient transported to CT 
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
## 2019-03-02 NOTE — ED Triage Notes (Signed)
Family states pt was discharged yesterday for cirrhosis and has gradual AMS since discharge.  Reports pt jaundice x 3 days.  Pt denies pain.
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
## 2019-03-03 ENCOUNTER — Inpatient Hospital Stay (HOSPITAL_COMMUNITY): Payer: Medicare HMO

## 2019-03-03 ENCOUNTER — Encounter (HOSPITAL_COMMUNITY): Admission: EM | Disposition: E | Payer: Self-pay | Source: Home / Self Care | Attending: Critical Care Medicine

## 2019-03-03 ENCOUNTER — Encounter (HOSPITAL_COMMUNITY): Payer: Self-pay | Admitting: Internal Medicine

## 2019-03-03 ENCOUNTER — Other Ambulatory Visit (HOSPITAL_COMMUNITY): Payer: Medicare HMO

## 2019-03-03 DIAGNOSIS — K922 Gastrointestinal hemorrhage, unspecified: Secondary | ICD-10-CM | POA: Diagnosis not present

## 2019-03-03 DIAGNOSIS — K810 Acute cholecystitis: Secondary | ICD-10-CM | POA: Diagnosis not present

## 2019-03-03 DIAGNOSIS — K766 Portal hypertension: Secondary | ICD-10-CM | POA: Diagnosis present

## 2019-03-03 DIAGNOSIS — B181 Chronic viral hepatitis B without delta-agent: Secondary | ICD-10-CM | POA: Diagnosis present

## 2019-03-03 DIAGNOSIS — Z20822 Contact with and (suspected) exposure to covid-19: Secondary | ICD-10-CM | POA: Diagnosis present

## 2019-03-03 DIAGNOSIS — G9340 Encephalopathy, unspecified: Secondary | ICD-10-CM | POA: Diagnosis not present

## 2019-03-03 DIAGNOSIS — G9341 Metabolic encephalopathy: Secondary | ICD-10-CM | POA: Diagnosis present

## 2019-03-03 DIAGNOSIS — J9601 Acute respiratory failure with hypoxia: Secondary | ICD-10-CM

## 2019-03-03 DIAGNOSIS — E870 Hyperosmolality and hypernatremia: Secondary | ICD-10-CM | POA: Diagnosis present

## 2019-03-03 DIAGNOSIS — K7460 Unspecified cirrhosis of liver: Secondary | ICD-10-CM | POA: Diagnosis not present

## 2019-03-03 DIAGNOSIS — I469 Cardiac arrest, cause unspecified: Secondary | ICD-10-CM | POA: Diagnosis not present

## 2019-03-03 DIAGNOSIS — K7200 Acute and subacute hepatic failure without coma: Secondary | ICD-10-CM | POA: Diagnosis present

## 2019-03-03 DIAGNOSIS — E872 Acidosis, unspecified: Secondary | ICD-10-CM | POA: Diagnosis present

## 2019-03-03 DIAGNOSIS — D689 Coagulation defect, unspecified: Secondary | ICD-10-CM | POA: Diagnosis not present

## 2019-03-03 DIAGNOSIS — J438 Other emphysema: Secondary | ICD-10-CM | POA: Diagnosis not present

## 2019-03-03 DIAGNOSIS — Z9911 Dependence on respirator [ventilator] status: Secondary | ICD-10-CM | POA: Diagnosis not present

## 2019-03-03 DIAGNOSIS — B159 Hepatitis A without hepatic coma: Secondary | ICD-10-CM | POA: Diagnosis present

## 2019-03-03 DIAGNOSIS — E876 Hypokalemia: Secondary | ICD-10-CM | POA: Diagnosis not present

## 2019-03-03 DIAGNOSIS — R579 Shock, unspecified: Secondary | ICD-10-CM | POA: Diagnosis not present

## 2019-03-03 DIAGNOSIS — K819 Cholecystitis, unspecified: Secondary | ICD-10-CM | POA: Diagnosis not present

## 2019-03-03 DIAGNOSIS — E875 Hyperkalemia: Secondary | ICD-10-CM | POA: Diagnosis not present

## 2019-03-03 DIAGNOSIS — D684 Acquired coagulation factor deficiency: Secondary | ICD-10-CM | POA: Diagnosis present

## 2019-03-03 DIAGNOSIS — E162 Hypoglycemia, unspecified: Secondary | ICD-10-CM | POA: Diagnosis not present

## 2019-03-03 DIAGNOSIS — K8000 Calculus of gallbladder with acute cholecystitis without obstruction: Secondary | ICD-10-CM | POA: Diagnosis present

## 2019-03-03 DIAGNOSIS — N179 Acute kidney failure, unspecified: Secondary | ICD-10-CM | POA: Diagnosis not present

## 2019-03-03 DIAGNOSIS — F17210 Nicotine dependence, cigarettes, uncomplicated: Secondary | ICD-10-CM | POA: Diagnosis present

## 2019-03-03 DIAGNOSIS — N183 Chronic kidney disease, stage 3 unspecified: Secondary | ICD-10-CM | POA: Diagnosis present

## 2019-03-03 DIAGNOSIS — B179 Acute viral hepatitis, unspecified: Secondary | ICD-10-CM | POA: Diagnosis not present

## 2019-03-03 DIAGNOSIS — Q8789 Other specified congenital malformation syndromes, not elsewhere classified: Secondary | ICD-10-CM | POA: Diagnosis not present

## 2019-03-03 DIAGNOSIS — K8020 Calculus of gallbladder without cholecystitis without obstruction: Secondary | ICD-10-CM | POA: Diagnosis present

## 2019-03-03 DIAGNOSIS — Z7189 Other specified counseling: Secondary | ICD-10-CM | POA: Diagnosis not present

## 2019-03-03 DIAGNOSIS — K7030 Alcoholic cirrhosis of liver without ascites: Secondary | ICD-10-CM | POA: Diagnosis present

## 2019-03-03 DIAGNOSIS — I472 Ventricular tachycardia: Secondary | ICD-10-CM | POA: Diagnosis not present

## 2019-03-03 DIAGNOSIS — K7581 Nonalcoholic steatohepatitis (NASH): Secondary | ICD-10-CM | POA: Diagnosis present

## 2019-03-03 DIAGNOSIS — R188 Other ascites: Secondary | ICD-10-CM | POA: Diagnosis not present

## 2019-03-03 DIAGNOSIS — I2510 Atherosclerotic heart disease of native coronary artery without angina pectoris: Secondary | ICD-10-CM | POA: Diagnosis not present

## 2019-03-03 DIAGNOSIS — I8511 Secondary esophageal varices with bleeding: Secondary | ICD-10-CM | POA: Diagnosis present

## 2019-03-03 DIAGNOSIS — D62 Acute posthemorrhagic anemia: Secondary | ICD-10-CM | POA: Diagnosis not present

## 2019-03-03 DIAGNOSIS — K7201 Acute and subacute hepatic failure with coma: Secondary | ICD-10-CM | POA: Diagnosis not present

## 2019-03-03 DIAGNOSIS — J449 Chronic obstructive pulmonary disease, unspecified: Secondary | ICD-10-CM | POA: Diagnosis present

## 2019-03-03 DIAGNOSIS — R578 Other shock: Secondary | ICD-10-CM | POA: Diagnosis not present

## 2019-03-03 DIAGNOSIS — K7290 Hepatic failure, unspecified without coma: Secondary | ICD-10-CM | POA: Diagnosis present

## 2019-03-03 HISTORY — PX: ESOPHAGOGASTRODUODENOSCOPY (EGD) WITH PROPOFOL: SHX5813

## 2019-03-03 LAB — LACTIC ACID, PLASMA: Lactic Acid, Venous: 5.2 mmol/L (ref 0.5–1.9)

## 2019-03-03 LAB — HEPATITIS B DNA, ULTRAQUANTITATIVE, PCR
HBV DNA SERPL PCR-ACNC: NOT DETECTED IU/mL
HBV DNA SERPL PCR-LOG IU: UNDETERMINED log10 IU/mL

## 2019-03-03 LAB — POCT I-STAT 7, (LYTES, BLD GAS, ICA,H+H)
Acid-base deficit: 8 mmol/L — ABNORMAL HIGH (ref 0.0–2.0)
Bicarbonate: 17.5 mmol/L — ABNORMAL LOW (ref 20.0–28.0)
Calcium, Ion: 1 mmol/L — ABNORMAL LOW (ref 1.15–1.40)
HCT: 30 % — ABNORMAL LOW (ref 39.0–52.0)
Hemoglobin: 10.2 g/dL — ABNORMAL LOW (ref 13.0–17.0)
O2 Saturation: 100 %
Patient temperature: 36
Potassium: 6.8 mmol/L (ref 3.5–5.1)
Sodium: 134 mmol/L — ABNORMAL LOW (ref 135–145)
TCO2: 19 mmol/L — ABNORMAL LOW (ref 22–32)
pCO2 arterial: 33.4 mmHg (ref 32.0–48.0)
pH, Arterial: 7.323 — ABNORMAL LOW (ref 7.350–7.450)
pO2, Arterial: 345 mmHg — ABNORMAL HIGH (ref 83.0–108.0)

## 2019-03-03 LAB — BASIC METABOLIC PANEL
Anion gap: 13 (ref 5–15)
Anion gap: 11 (ref 5–15)
BUN: 25 mg/dL — ABNORMAL HIGH (ref 6–20)
BUN: 27 mg/dL — ABNORMAL HIGH (ref 6–20)
CO2: 18 mmol/L — ABNORMAL LOW (ref 22–32)
CO2: 13 mmol/L — ABNORMAL LOW (ref 22–32)
Calcium: 8.5 mg/dL — ABNORMAL LOW (ref 8.9–10.3)
Calcium: 7.7 mg/dL — ABNORMAL LOW (ref 8.9–10.3)
Chloride: 102 mmol/L (ref 98–111)
Chloride: 110 mmol/L (ref 98–111)
Creatinine, Ser: 1.62 mg/dL — ABNORMAL HIGH (ref 0.61–1.24)
Creatinine, Ser: 1.71 mg/dL — ABNORMAL HIGH (ref 0.61–1.24)
GFR calc Af Amer: 56 mL/min — ABNORMAL LOW (ref >60)
GFR calc Af Amer: 52 mL/min — ABNORMAL LOW (ref >60)
GFR calc non Af Amer: 48 mL/min — ABNORMAL LOW (ref >60)
GFR calc non Af Amer: 45 mL/min — ABNORMAL LOW (ref >60)
Glucose, Bld: 91 mg/dL (ref 70–99)
Glucose, Bld: 108 mg/dL — ABNORMAL HIGH (ref 70–99)
Potassium: 4.6 mmol/L (ref 3.5–5.1)
Potassium: 5.3 mmol/L — ABNORMAL HIGH (ref 3.5–5.1)
Sodium: 133 mmol/L — ABNORMAL LOW (ref 135–145)
Sodium: 134 mmol/L — ABNORMAL LOW (ref 135–145)

## 2019-03-03 LAB — URINALYSIS, ROUTINE W REFLEX MICROSCOPIC
Bacteria, UA: NONE SEEN
Glucose, UA: NEGATIVE mg/dL
Ketones, ur: NEGATIVE mg/dL
Leukocytes,Ua: NEGATIVE
Nitrite: NEGATIVE
Protein, ur: NEGATIVE mg/dL
Specific Gravity, Urine: 1.012 (ref 1.005–1.030)
pH: 6 (ref 5.0–8.0)

## 2019-03-03 LAB — CBC
HCT: 39.1 % (ref 39.0–52.0)
HCT: 31.6 % — ABNORMAL LOW (ref 39.0–52.0)
Hemoglobin: 13.7 g/dL (ref 13.0–17.0)
Hemoglobin: 11.1 g/dL — ABNORMAL LOW (ref 13.0–17.0)
MCH: 30.7 pg (ref 26.0–34.0)
MCH: 30.8 pg (ref 26.0–34.0)
MCHC: 35 g/dL (ref 30.0–36.0)
MCHC: 35.1 g/dL (ref 30.0–36.0)
MCV: 87.7 fL (ref 80.0–100.0)
MCV: 87.8 fL (ref 80.0–100.0)
Platelets: 103 10*3/uL — ABNORMAL LOW (ref 150–400)
Platelets: 138 10*3/uL — ABNORMAL LOW (ref 150–400)
RBC: 4.46 MIL/uL (ref 4.22–5.81)
RBC: 3.6 MIL/uL — ABNORMAL LOW (ref 4.22–5.81)
RDW: 20.4 % — ABNORMAL HIGH (ref 11.5–15.5)
RDW: 20.2 % — ABNORMAL HIGH (ref 11.5–15.5)
WBC: 6.5 10*3/uL (ref 4.0–10.5)
WBC: 9.5 10*3/uL (ref 4.0–10.5)
nRBC: 0 % (ref 0.0–0.2)
nRBC: 0 % (ref 0.0–0.2)

## 2019-03-03 LAB — BLOOD GAS, ARTERIAL
Acid-base deficit: 11.1 mmol/L — ABNORMAL HIGH (ref 0.0–2.0)
Bicarbonate: 12.8 mmol/L — ABNORMAL LOW (ref 20.0–28.0)
Drawn by: 511911
FIO2: 21
O2 Saturation: 98.7 %
Patient temperature: 36.9
pCO2 arterial: 20.6 mmHg — ABNORMAL LOW (ref 32.0–48.0)
pH, Arterial: 7.408 (ref 7.350–7.450)
pO2, Arterial: 118 mmHg — ABNORMAL HIGH (ref 83.0–108.0)

## 2019-03-03 LAB — CBC WITH DIFFERENTIAL/PLATELET
Abs Immature Granulocytes: 0.08 10*3/uL — ABNORMAL HIGH (ref 0.00–0.07)
Basophils Absolute: 0.1 10*3/uL (ref 0.0–0.1)
Basophils Relative: 1 %
Eosinophils Absolute: 0.1 10*3/uL (ref 0.0–0.5)
Eosinophils Relative: 1 %
HCT: 38.2 % — ABNORMAL LOW (ref 39.0–52.0)
Hemoglobin: 13.5 g/dL (ref 13.0–17.0)
Immature Granulocytes: 1 %
Lymphocytes Relative: 19 %
Lymphs Abs: 1.6 10*3/uL (ref 0.7–4.0)
MCH: 30.3 pg (ref 26.0–34.0)
MCHC: 35.3 g/dL (ref 30.0–36.0)
MCV: 85.8 fL (ref 80.0–100.0)
Monocytes Absolute: 1.3 10*3/uL — ABNORMAL HIGH (ref 0.1–1.0)
Monocytes Relative: 16 %
Neutro Abs: 5 10*3/uL (ref 1.7–7.7)
Neutrophils Relative %: 62 %
Platelets: 164 10*3/uL (ref 150–400)
RBC: 4.45 MIL/uL (ref 4.22–5.81)
RDW: 19.9 % — ABNORMAL HIGH (ref 11.5–15.5)
WBC: 8.1 10*3/uL (ref 4.0–10.5)
nRBC: 0 % (ref 0.0–0.2)

## 2019-03-03 LAB — HEPATIC FUNCTION PANEL
ALT: 369 U/L — ABNORMAL HIGH (ref 0–44)
AST: 371 U/L — ABNORMAL HIGH (ref 15–41)
Albumin: 2.5 g/dL — ABNORMAL LOW (ref 3.5–5.0)
Alkaline Phosphatase: 92 U/L (ref 38–126)
Bilirubin, Direct: 15.5 mg/dL — ABNORMAL HIGH (ref 0.0–0.2)
Indirect Bilirubin: 9.8 mg/dL — ABNORMAL HIGH (ref 0.3–0.9)
Total Bilirubin: 25.3 mg/dL (ref 0.3–1.2)
Total Protein: 7.8 g/dL (ref 6.5–8.1)

## 2019-03-03 LAB — PROTIME-INR
INR: 1.7 — ABNORMAL HIGH (ref 0.8–1.2)
Prothrombin Time: 20.3 seconds — ABNORMAL HIGH (ref 11.4–15.2)

## 2019-03-03 LAB — RAPID URINE DRUG SCREEN, HOSP PERFORMED
Amphetamines: NOT DETECTED
Barbiturates: NOT DETECTED
Benzodiazepines: NOT DETECTED
Cocaine: NOT DETECTED
Opiates: NOT DETECTED
Tetrahydrocannabinol: NOT DETECTED

## 2019-03-03 LAB — APTT: aPTT: 49 seconds — ABNORMAL HIGH (ref 24–36)

## 2019-03-03 LAB — AMMONIA: Ammonia: 61 umol/L — ABNORMAL HIGH (ref 9–35)

## 2019-03-03 LAB — PREPARE RBC (CROSSMATCH)

## 2019-03-03 LAB — SARS CORONAVIRUS 2 (TAT 6-24 HRS): SARS Coronavirus 2: NEGATIVE

## 2019-03-03 LAB — CREATININE, SERUM
Creatinine, Ser: 0.99 mg/dL (ref 0.61–1.24)
GFR calc Af Amer: >60 mL/min (ref >60)
GFR calc non Af Amer: >60 mL/min (ref >60)

## 2019-03-03 LAB — GLUCOSE, CAPILLARY: Glucose-Capillary: 74 mg/dL (ref 70–99)

## 2019-03-03 LAB — HEPATITIS B E ANTIBODY: Hep B E Ab: POSITIVE — AB

## 2019-03-03 LAB — LIPASE, BLOOD: Lipase: 32 U/L (ref 11–51)

## 2019-03-03 SURGERY — ESOPHAGOGASTRODUODENOSCOPY (EGD) WITH PROPOFOL
Anesthesia: Moderate Sedation

## 2019-03-03 MED ORDER — PHENYLEPHRINE HCL-NACL 10-0.9 MG/250ML-% IV SOLN
25.0000 ug/min | INTRAVENOUS | Status: DC
  Administered 2019-03-04: 150 ug/min via INTRAVENOUS
  Administered 2019-03-04: 60 ug/min via INTRAVENOUS
  Administered 2019-03-04: 50 ug/min via INTRAVENOUS
  Administered 2019-03-04: 100 ug/min via INTRAVENOUS
  Administered 2019-03-04: 60 ug/min via INTRAVENOUS
  Filled 2019-03-03 (×2): qty 250
  Filled 2019-03-03: qty 500
  Filled 2019-03-03 (×2): qty 250

## 2019-03-03 MED ORDER — SODIUM CHLORIDE 0.9 % IV SOLN
8.0000 mg | Freq: Once | INTRAVENOUS | Status: AC
  Administered 2019-03-04: 8 mg via INTRAVENOUS
  Filled 2019-03-03: qty 4

## 2019-03-03 MED ORDER — SODIUM CHLORIDE 0.9 % IV BOLUS
1000.0000 mL | Freq: Once | INTRAVENOUS | Status: AC | PRN
  Administered 2019-03-03: 1000 mL via INTRAVENOUS

## 2019-03-03 MED ORDER — FENTANYL CITRATE (PF) 100 MCG/2ML IJ SOLN
INTRAMUSCULAR | Status: AC
  Administered 2019-03-03: 50 ug via INTRAVENOUS
  Filled 2019-03-03: qty 2

## 2019-03-03 MED ORDER — SODIUM CHLORIDE 0.9 % IV SOLN
50.0000 ug/h | INTRAVENOUS | Status: AC
  Administered 2019-03-03 – 2019-03-06 (×7): 50 ug/h via INTRAVENOUS
  Filled 2019-03-03 (×8): qty 1

## 2019-03-03 MED ORDER — SODIUM CHLORIDE 0.9 % IV SOLN: INTRAVENOUS | Status: DC

## 2019-03-03 MED ORDER — BUDESONIDE 0.5 MG/2ML IN SUSP: 0.5000 mg | Freq: Two times a day (BID) | RESPIRATORY_TRACT | Status: DC

## 2019-03-03 MED ORDER — FENTANYL 2500MCG IN NS 250ML (10MCG/ML) PREMIX INFUSION
50.0000 ug/h | INTRAVENOUS | Status: DC
  Administered 2019-03-03: 50 ug/h via INTRAVENOUS
  Administered 2019-03-04 – 2019-03-05 (×4): 200 ug/h via INTRAVENOUS
  Filled 2019-03-03 (×5): qty 250

## 2019-03-03 MED ORDER — FENTANYL CITRATE (PF) 100 MCG/2ML IJ SOLN: 50.0000 ug | Freq: Once | INTRAMUSCULAR | Status: AC

## 2019-03-03 MED ORDER — SODIUM CHLORIDE 0.9 % IV SOLN
2.0000 g | Freq: Two times a day (BID) | INTRAVENOUS | Status: DC
  Filled 2019-03-03 (×2): qty 2

## 2019-03-03 MED ORDER — CHLORHEXIDINE GLUCONATE CLOTH 2 % EX PADS
6.0000 | MEDICATED_PAD | Freq: Every day | CUTANEOUS | Status: DC
  Administered 2019-03-04 – 2019-03-13 (×8): 6 via TOPICAL

## 2019-03-03 MED ORDER — SODIUM CHLORIDE 0.9 % IV SOLN
8.0000 mg/h | INTRAVENOUS | Status: AC
  Administered 2019-03-04 – 2019-03-06 (×8): 8 mg/h via INTRAVENOUS
  Filled 2019-03-03 (×9): qty 80

## 2019-03-03 MED ORDER — SODIUM CHLORIDE 0.9 % IV SOLN
2.0000 g | Freq: Every day | INTRAVENOUS | Status: DC
  Administered 2019-03-03: 2 g via INTRAVENOUS
  Filled 2019-03-03: qty 2

## 2019-03-03 MED ORDER — IPRATROPIUM-ALBUTEROL 0.5-2.5 (3) MG/3ML IN SOLN
3.0000 mL | Freq: Four times a day (QID) | RESPIRATORY_TRACT | Status: DC
  Administered 2019-03-04 – 2019-03-09 (×23): 3 mL via RESPIRATORY_TRACT
  Filled 2019-03-03 (×24): qty 3

## 2019-03-03 MED ORDER — EPINEPHRINE 1 MG/10ML IJ SOSY
PREFILLED_SYRINGE | INTRAMUSCULAR | Status: AC
  Filled 2019-03-03: qty 20

## 2019-03-03 MED ORDER — MIDAZOLAM HCL (PF) 10 MG/2ML IJ SOLN
INTRAMUSCULAR | Status: DC | PRN
  Administered 2019-03-03: 2 mg via INTRAVENOUS

## 2019-03-03 MED ORDER — FENTANYL CITRATE (PF) 100 MCG/2ML IJ SOLN
INTRAMUSCULAR | Status: AC
  Filled 2019-03-03: qty 4

## 2019-03-03 MED ORDER — ATROPINE SULFATE 1 MG/10ML IJ SOSY
PREFILLED_SYRINGE | INTRAMUSCULAR | Status: AC
  Filled 2019-03-03: qty 10

## 2019-03-03 MED ORDER — SODIUM CHLORIDE 0.9 % IV SOLN: 2.0000 g | INTRAVENOUS | Status: DC

## 2019-03-03 MED ORDER — LORAZEPAM 2 MG/ML IJ SOLN
0.5000 mg | Freq: Once | INTRAMUSCULAR | Status: AC
  Administered 2019-03-03: 0.5 mg via INTRAVENOUS
  Filled 2019-03-03: qty 1

## 2019-03-03 MED ORDER — MIDAZOLAM HCL (PF) 5 MG/ML IJ SOLN
INTRAMUSCULAR | Status: AC
  Filled 2019-03-03: qty 2

## 2019-03-03 MED ORDER — SODIUM CHLORIDE 0.9 % IV SOLN
250.0000 mL | INTRAVENOUS | Status: DC
  Administered 2019-03-04 – 2019-03-05 (×2): 250 mL via INTRAVENOUS

## 2019-03-03 MED ORDER — PHENYLEPHRINE HCL-NACL 10-0.9 MG/250ML-% IV SOLN
INTRAVENOUS | Status: AC
  Administered 2019-03-03: 50 ug/min via INTRAVENOUS
  Filled 2019-03-03: qty 250

## 2019-03-03 MED ORDER — METOCLOPRAMIDE HCL 5 MG/ML IJ SOLN
10.0000 mg | Freq: Three times a day (TID) | INTRAMUSCULAR | Status: DC
  Administered 2019-03-04 (×2): 10 mg via INTRAVENOUS
  Filled 2019-03-03 (×2): qty 2

## 2019-03-03 MED ORDER — ALBUMIN HUMAN 5 % IV SOLN
25.0000 g | Freq: Once | INTRAVENOUS | Status: AC
  Administered 2019-03-03: 25 g via INTRAVENOUS
  Filled 2019-03-03: qty 500

## 2019-03-03 MED ORDER — OXYCODONE HCL 5 MG PO TABS
5.0000 mg | ORAL_TABLET | ORAL | Status: DC | PRN
  Administered 2019-03-03 (×2): 5 mg via ORAL
  Filled 2019-03-03 (×2): qty 1

## 2019-03-03 MED ORDER — PANTOPRAZOLE SODIUM 40 MG IV SOLR: 40.0000 mg | Freq: Two times a day (BID) | INTRAVENOUS | Status: DC

## 2019-03-03 MED ORDER — HYDROMORPHONE HCL 1 MG/ML IJ SOLN
0.5000 mg | Freq: Once | INTRAMUSCULAR | Status: AC
  Administered 2019-03-03: 0.5 mg via INTRAVENOUS
  Filled 2019-03-03: qty 1

## 2019-03-03 MED ORDER — FENTANYL BOLUS VIA INFUSION
50.0000 ug | INTRAVENOUS | Status: DC | PRN
  Administered 2019-03-04: 25 ug via INTRAVENOUS
  Administered 2019-03-05 – 2019-03-06 (×9): 50 ug via INTRAVENOUS
  Filled 2019-03-03: qty 50

## 2019-03-03 MED ORDER — ALBUTEROL SULFATE (2.5 MG/3ML) 0.083% IN NEBU
2.5000 mg | INHALATION_SOLUTION | RESPIRATORY_TRACT | Status: DC | PRN
  Administered 2019-03-12: 2.5 mg via RESPIRATORY_TRACT
  Filled 2019-03-03: qty 3

## 2019-03-03 MED ORDER — SODIUM CHLORIDE 0.9 % IV BOLUS
1000.0000 mL | Freq: Once | INTRAVENOUS | Status: AC
  Administered 2019-03-03: 1000 mL via INTRAVENOUS

## 2019-03-03 MED ORDER — SODIUM CHLORIDE 0.9% IV SOLUTION: Freq: Once | INTRAVENOUS | Status: AC

## 2019-03-03 MED ORDER — DIPHENHYDRAMINE HCL 50 MG/ML IJ SOLN
INTRAMUSCULAR | Status: AC
  Filled 2019-03-03: qty 1

## 2019-03-03 MED ORDER — DIPHENHYDRAMINE HCL 25 MG PO CAPS: 25.0000 mg | ORAL_CAPSULE | Freq: Four times a day (QID) | ORAL | Status: DC | PRN

## 2019-03-03 SURGICAL SUPPLY — 14 items
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
## 2019-03-03 NOTE — Progress Notes (Signed)
[facility] PROGRESS NOTE  Glenn Alvarado DVV:[id number] DOB: [birth year] DOA: 02/28/2019 PCP: Patient, No Pcp Per  Assessment/Plan:  1. Acute hepatic encephalopathy with a recent diagnosis of hepatitis A with previous history of hepatitis B and cirrhosis of the liver. Recently started on Xifaxan and lactulose dose has been increased. Improved this am. LFT's with worsening total bili, AST/ALT stable but ammonia level trending up. Patient has been refusing meds/care but cooperative this am. SBP coverage with ceftriaxone started. Will obtain limited US to evaluated for ascites. Afebrile.  2. Cholecystitis. CT with severe gall bladder distension noted with large gallstone in neck of gallbladder.patient with abdominal pain. Await results of [place]. Change diet to clears 3. Chronic kidney disease stage III creatinine appears to be a little above baseline this am. Hold nephrotoxins, gentle IV fluids, monitor urine output 4. Hypertension on propranolol.  Mildly bradycardic.  Have placed patient on holding parameters. 5. COPD not actively wheezing. 6. Cholelithiasis follow CT abdomen 7. Metabolic acidosis. Likely from above and decreased oral intake. CO2 18. Gentle IV fluids. Monitor intake and output. Recheck in am   Code Status: full Family Communication: patient Disposition Plan: home when ready   Consultants:    Procedures:    Antibiotics:  Rocephin 12/27>>  HPI/Subjective: Lying in bed crying. Complaining of abdominal pain. Cooperating with meds  Objective: Vitals:   02/25/2019 0912 03/05/2019 1043  BP: 136/75 136/75  Pulse: 74 74  Resp: 18 18  Temp: 97.8 F (36.6 C) 97.8 F (36.6 C)  SpO2: 100%     Intake/Output Summary (Last 24 hours) at 03/05/2019 1415 Last data filed at 02/21/2019 1345 Gross per 24 hour  Intake 1520 ml  Output 400 ml  Net 1120 ml   Filed Weights   03/01/2019 0226 02/24/2019 1043  Weight: 80.3 kg 80.3 kg    Exam:   General:  Quite jaundiced  anxious, in pain  Cardiovascular: rrr no mgr  Respiratory: normal effort BS distant but clear  Abdomen: mildly distended but soft. Umbilical hernia. +BS mild diffuse tenderness to palpation worse on right upper quadrant  Musculoskeletal: joints without swelling/erythema   Data Reviewed: Basic Metabolic Panel: Recent Labs  Lab 02/27/19 0303 02/28/19 0417 03/01/19 0439 03/06/2019 1624 02/13/2019 0128 02/09/2019 0916  NA 132* 136 133* 131*  --  133*  K 4.6 3.8 4.4 4.5  --  4.6  CL 102 107 104 103  --  102  CO2 24 20* 19* 20*  --  18*  GLUCOSE 96 104* 92 97  --  91  BUN 28* 26* 24* 20  --  25*  CREATININE 2.12* 1.49* 1.48* 1.41* 0.99 1.62*  CALCIUM 8.1* 8.5* 8.4* 8.6*  --  8.5*   Liver Function Tests: Recent Labs  Lab 02/27/19 0303 02/28/19 0417 03/01/19 0439 02/21/2019 1624 02/25/2019 0916  AST 612* 446* 484* 409* 371*  ALT 717* 534* 537* 482* 369*  ALKPHOS 92 91 97 115 92  BILITOT 15.3* 15.4* 18.9* 25.8* 25.3*  PROT 6.7 6.4* 7.5 8.3* 7.8  ALBUMIN 2.6* 2.3* 2.6* 2.8* 2.5*   Recent Labs  Lab 02/25/19 1411 02/27/19 0303 02/12/2019 1624 02/19/2019 0916  LIPASE 30 33 26 32   Recent Labs  Lab 02/27/19 0304 02/19/2019 2124 03/01/2019 0916  AMMONIA 42* 43* 61*   CBC: Recent Labs  Lab 02/25/19 1411 02/25/19 1435 02/27/19 0303 02/28/19 0417 03/01/19 0439 02/04/2019 1624 02/24/2019 0128 02/14/2019 0916  WBC 5.7   < > 4.8 3.9* 6.0 9.5 6.5 8.1  NEUTROABS  3.6  --  2.5 2.6 3.9  --   --  5.0  HGB 15.5  --  12.0* 12.3* 14.2 14.5 13.7 13.5  HCT 45.5  --  34.5* 34.1* 39.1 42.2 39.1 38.2*  MCV 89.6   < > 85.6 84.4 84.3 86.8 87.7 85.8  PLT 148*   < > 118* 100* 119* 171 103* 164   < > = values in this interval not displayed.   Cardiac Enzymes: No results for input(s): CKTOTAL, CKMB, CKMBINDEX, TROPONINI in the last 168 hours. BNP (last 3 results) No results for input(s): BNP in the last 8760 hours.  ProBNP (last 3 results) No results for input(s): PROBNP in the last 8760  hours.  CBG: No results for input(s): GLUCAP in the last 168 hours.  Recent Results (from the past 240 hour(s))  SARS CORONAVIRUS 2 (TAT 6-24 HRS) Nasopharyngeal Nasopharyngeal Swab     Status: None   Collection Time: 02/25/19  7:34 PM   Specimen: Nasopharyngeal Swab  Result Value Ref Range Status   SARS Coronavirus 2 NEGATIVE NEGATIVE Final    Comment: (NOTE) SARS-CoV-2 target nucleic acids are NOT DETECTED. The SARS-CoV-2 RNA is generally detectable in upper and lower respiratory specimens during the acute phase of infection. Negative results do not preclude SARS-CoV-2 infection, do not rule out co-infections with other pathogens, and should not be used as the sole basis for treatment or other patient management decisions. Negative results must be combined with clinical observations, patient history, and epidemiological information. The expected result is Negative. Fact Sheet for Patients: SugarRoll.be Fact Sheet for Healthcare Providers: https://www.woods-mathews.com/ This test is not yet approved or cleared by the [place] FDA and  has been authorized for detection and/or diagnosis of SARS-CoV-2 by FDA under an Emergency Use Authorization (EUA). This EUA will remain  in effect (meaning this test can be used) for the duration of the COVID-19 declaration under Section 56 4(b)(1) of the Act, 21 U.S.C. section 360bbb-3(b)(1), unless the authorization is terminated or revoked sooner. Performed at [facility], [place] [address] [place], [place] [address]   Culture, Urine     Status: None   Collection Time: 02/26/19 10:47 AM   Specimen: Urine, Clean Catch  Result Value Ref Range Status   Specimen Description URINE, CLEAN CATCH  Final   Special Requests NONE  Final   Culture   Final    NO GROWTH Performed at [facility], [place] [address] [place], [place] [address]    Report Status 02/27/2019 FINAL  Final  Culture, blood  (routine x 2)     Status: None (Preliminary result)   Collection Time: 02/27/19  9:27 AM   Specimen: BLOOD  Result Value Ref Range Status   Specimen Description BLOOD LEFT ANTECUBITAL  Final   Special Requests   Final    BOTTLES DRAWN AEROBIC AND ANAEROBIC Blood Culture adequate volume   Culture   Final    NO GROWTH 4 DAYS Performed at [facility], [place] [address], [place], [place] [address]    Report Status PENDING  Incomplete  Culture, blood (routine x 2)     Status: None (Preliminary result)   Collection Time: 02/27/19  9:39 AM   Specimen: BLOOD LEFT HAND  Result Value Ref Range Status   Specimen Description BLOOD LEFT HAND  Final   Special Requests   Final    BOTTLES DRAWN AEROBIC AND ANAEROBIC Blood Culture adequate volume   Culture   Final  NO GROWTH 4 DAYS Performed at [facility], [place] [address], [place], [place] [address]    Report Status PENDING  Incomplete  MRSA PCR Screening     Status: Abnormal   Collection Time: 02/27/19 12:13 PM   Specimen: Nasopharyngeal  Result Value Ref Range Status   MRSA by PCR POSITIVE (A) NEGATIVE Final    Comment:        The GeneXpert MRSA Assay (FDA approved for NASAL specimens only), is one component of a comprehensive MRSA colonization surveillance program. It is not intended to diagnose MRSA infection nor to guide or monitor treatment for MRSA infections. RESULT CALLED TO, READ BACK BY AND VERIFIED WITH: Sharlot Gowda RN 14:45 02/27/19 ([email or web address]) Performed at [facility], [place] [address], [place], [place] [address]   SARS CORONAVIRUS 2 (TAT 6-24 HRS) Nasopharyngeal Nasopharyngeal Swab     Status: None   Collection Time: 02/25/2019  8:50 PM   Specimen: Nasopharyngeal Swab  Result Value Ref Range Status   SARS Coronavirus 2 NEGATIVE NEGATIVE Final    Comment: (NOTE) SARS-CoV-2 target nucleic acids are NOT DETECTED. The SARS-CoV-2 RNA is generally detectable in upper and lower respiratory specimens during the  acute phase of infection. Negative results do not preclude SARS-CoV-2 infection, do not rule out co-infections with other pathogens, and should not be used as the sole basis for treatment or other patient management decisions. Negative results must be combined with clinical observations, patient history, and epidemiological information. The expected result is Negative. Fact Sheet for Patients: SugarRoll.be Fact Sheet for Healthcare Providers: https://www.woods-mathews.com/ This test is not yet approved or cleared by the [place] FDA and  has been authorized for detection and/or diagnosis of SARS-CoV-2 by FDA under an Emergency Use Authorization (EUA). This EUA will remain  in effect (meaning this test can be used) for the duration of the COVID-19 declaration under Section 56 4(b)(1) of the Act, 21 U.S.C. section 360bbb-3(b)(1), unless the authorization is terminated or revoked sooner. Performed at [facility], [place] [address] [place], [place] [address]      Studies: CT ABDOMEN PELVIS WO CONTRAST  Result Date: 03/06/2019 CLINICAL DATA:  Acute generalized abdominal pain and distention. Jaundice. EXAM: CT ABDOMEN AND PELVIS WITHOUT CONTRAST TECHNIQUE: Multidetector CT imaging of the abdomen and pelvis was performed following the standard protocol without IV contrast. COMPARISON:  February 25, 2019. FINDINGS: Lower chest: No acute abnormality. Hepatobiliary: There is again noted severe gallbladder distension with large gallstone in the neck of the gallbladder. Nodular hepatic contours are noted consistent with hepatic cirrhosis. No definite focal abnormality is seen in the liver on these unenhanced images. No definite biliary dilatation is noted. Pancreas: Unremarkable. No pancreatic ductal dilatation or surrounding inflammatory changes. Spleen: Normal in size without focal abnormality. Adrenals/Urinary Tract: Adrenal glands appear normal.  Bilateral nonobstructive nephrolithiasis is noted. No hydronephrosis or renal obstruction is noted. Urinary bladder is unremarkable. Stomach/Bowel: Stomach is within normal limits. Appendix appears normal. No evidence of bowel wall thickening, distention, or inflammatory changes. Vascular/Lymphatic: Aortic atherosclerosis. No enlarged abdominal or pelvic lymph nodes. Enlarged collateral veins are seen in the left upper quadrant consistent with portal hypertension. Reproductive: Prostate is unremarkable. Other: No definite ascites is noted. Moderate size fat containing periumbilical hernia is noted. Musculoskeletal: No acute or significant osseous findings. IMPRESSION: 1. Bilateral nonobstructive nephrolithiasis. No hydronephrosis or renal obstruction is noted. 2. Aortic atherosclerosis. 3. Findings consistent with hepatic cirrhosis. 4. Severe gallbladder distension is noted with large gallstone  in the neck of the gallbladder. 5. Enlarged collateral veins are seen in the left upper quadrant consistent with portal hypertension. 6. Moderate size fat containing periumbilical hernia. Aortic Atherosclerosis (ICD10-I70.0). Electronically Signed   By: Marijo Conception M.D.   On: 02/06/2019 07:38   CT Head Wo Contrast  Result Date: 02/08/2019 CLINICAL DATA:  Altered mental status EXAM: CT HEAD WITHOUT CONTRAST TECHNIQUE: Contiguous axial images were obtained from the base of the skull through the vertex without intravenous contrast. COMPARISON:  Jul 24, 2018 FINDINGS: Brain: No evidence of acute territorial infarction, hemorrhage, hydrocephalus,extra-axial collection or mass lesion/mass effect. There is mild low-attenuation changes in the deep white matter consistent with small vessel ischemia. Ventricles are normal in size and contour. Vascular: No hyperdense vessel or unexpected calcification. Skull: The skull is intact. No fracture or focal lesion identified. Healed left mandibular ramus fracture seen. Sinuses/Orbits:  The visualized paranasal sinuses and mastoid air cells are clear. The orbits and globes intact. Other: None IMPRESSION: No acute intracranial abnormality. Findings consistent with chronic small vessel ischemia. Electronically Signed   By: Prudencio Pair M.D.   On: 02/06/2019 20:26   DG Chest Portable 1 View  Result Date: 02/18/2019 CLINICAL DATA:  53 year old male with altered mental status. EXAM: PORTABLE CHEST 1 VIEW COMPARISON:  Chest radiograph dated 02/27/2019. FINDINGS: Mild diffuse interstitial prominence. No focal consolidation, pleural effusion, or pneumothorax. Top-normal cardiac size. No acute osseous pathology. IMPRESSION: No acute cardiopulmonary process. Electronically Signed   By: Anner Crete M.D.   On: 02/06/2019 19:49    Scheduled Meds: . amLODipine  10 mg Oral Daily  . feeding supplement (ENSURE ENLIVE)  237 mL Oral TID BM  . heparin  5,000 Units Subcutaneous Q8H  . lactulose  30 g Oral TID  . mometasone-formoterol  2 puff Inhalation BID  . propranolol  40 mg Oral BID  . rifaximin  550 mg Oral BID  . sodium chloride flush  3 mL Intravenous Once   Continuous Infusions: . [START ON 02/10/2019] cefTRIAXone (ROCEPHIN)  IV      Principal Problem:   Hepatic encephalopathy ([name]) Active Problems:   Acute encephalopathy   Cholelithiasis   Cholecystitis   Essential hypertension   COPD (chronic obstructive pulmonary disease) ([name])   Acute hepatitis   Decompensated hepatic cirrhosis ([name])   Metabolic acidosis   CAD (coronary artery disease)    Time spent: 73    [place] NP  [facility]  If 7PM-7AM, please contact night-coverage at [email or web address], password [facility] 02/18/2019, 2:15 PM  LOS: 0 days

## 2019-03-03 NOTE — Procedures (Signed)
Endotracheal Intubation Procedure Note  Indication for endotracheal intubation: impending airway compromise. Airway Assessment: Mallampati Class: II (hard and soft palate, upper portion of tonsils anduvula visible). Sedation: etomidate. Paralytic: none. Lidocaine: no. Atropine: no. Equipment: Macintosh 4 laryngoscope blade glidescope. Cricoid Pressure: no. Number of attempts: 1. ETT location confirmed by by auscultation and ETCO2 monitor, CXR pending.  Shellia Cleverly 03/01/2019
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
## 2019-03-03 NOTE — Progress Notes (Signed)
New Admission Note:  Arrival Method: Via stretcher from ED to 25m1. Mental Orientation: Alert & Oriented x2 (Person & Place) Telemetry: CCMD verifed. Box #15 Assessment: Completed Skin: Refer to flowsheet IV: Right AC x2 Pain: 5/10 Safety Measures: Safety Fall Prevention Plan discussed with patient. Admission: Completed 5 [place] Orientation: Patient has been orientated to the room, unit and the staff.  Orders have been reviewed and are being implemented. Will continue to monitor the patient. Call light has been placed within reach and bed alarm has been activated.   JVassie Moselle RN  Phone Number: [phone]

## 2019-03-03 NOTE — Progress Notes (Signed)
Pt refusing to drink oral contrast for CT. On call provider made aware.

## 2019-03-03 NOTE — Brief Op Note (Signed)
02/21/2019 - 02/25/2019  11:12 PM  PATIENT:  Glenn Alvarado  53 y.o. male  PRE-OPERATIVE DIAGNOSIS:  GI bleed  POST-OPERATIVE DIAGNOSIS: No esophageal varices, large blood clot in gastric fundus  PROCEDURE:  Procedure(s): ESOPHAGOGASTRODUODENOSCOPY (EGD) WITH PROPOFOL (N/A)  SURGEON:  Surgeon(s) and Role:    * Alannis Hsia, MD - Primary  Findings ------------ -EGD showed no evidence of esophageal varices.  Very large blood clot as well as evidence of recent bleeding seen in the gastric fundus.  Despite of multiple attempts with the suction, I was not able to visualize a bleeding source.  Recommendations ------------------------ -Start Reglan 10 mg IV 3 times daily -Plan for repeat EGD tomorrow if ongoing drop in hemoglobin -Continue octreotide, Protonix and antibiotics - D/W [name] over the phone.   Otis Brace MD, [place] 02/18/2019, 11:15 PM  Contact #  [phone]

## 2019-03-03 NOTE — Progress Notes (Signed)
Pt refusing medications. RN educated. RN will try again at a later time.

## 2019-03-03 NOTE — Op Note (Signed)
[facility] Patient Name: Glenn Alvarado Procedure Date : 02/11/2019 MRN: [id number] Attending MD: Otis Brace , MD Date of Birth: 1965/08/28 CSN: [id number] Age: 53 Admit Type: Inpatient Procedure:                Upper GI endoscopy Indications:              Hematemesis Providers:                Otis Brace, MD,Carrie RN , Lina Sar,                            Technician, Baird Cancer, RN Referring MD:              Medicines:                Midazolam 2 mg IV Complications:            No immediate complications. Estimated Blood Loss:     Estimated blood loss was minimal. Procedure:                Pre-Anesthesia Assessment:                           - Prior to the procedure, a History and Physical                            was performed, and patient medications and                            allergies were reviewed. The patient's tolerance of                            previous anesthesia was also reviewed. The risks                            and benefits of the procedure and the sedation                            options and risks were discussed with the patient.                            All questions were answered, and informed consent                            was obtained. Prior Anticoagulants: The patient has                            taken no previous anticoagulant or antiplatelet                            agents. ASA Grade Assessment: III - A patient with                            severe systemic disease. After reviewing the risks  and benefits, the patient was deemed in                            satisfactory condition to undergo the procedure.                           After obtaining informed consent, the endoscope was                            passed under direct vision. Throughout the                            procedure, the patient's blood pressure, pulse, and                            oxygen saturations  were monitored continuously. The                            GIF-H190 ([id number]) Olympus gastroscope was                            introduced through the mouth, and advanced to the                            second part of duodenum. The upper GI endoscopy was                            technically difficult and complex due to excessive                            bleeding. The patient tolerated the procedure well. Scope In: Scope Out: Findings:      There is no endoscopic evidence of ulcerations or varices in the entire       esophagus.      Red blood was found in the gastric fundus.      Clotted blood was found in the gastric fundus. Very large blood clot as       well as evidence of recent bleeding seen in the gastric fundus. Despite       of multiple attempts with the suction, I was not able to visualize a       bleeding source      The duodenal bulb, first portion of the duodenum and second portion of       the duodenum were normal. Impression:               - Red blood in the gastric fundus.                           - Clotted blood in the gastric fundus.                           - Normal duodenal bulb, first portion of the                            duodenum and second portion of the duodenum.                           -  No specimens collected. Moderate Sedation:      Moderate (conscious) sedation was administered by the endoscopy nurse       and supervised by the endoscopist. The following parameters were       monitored: oxygen saturation, heart rate, blood pressure, and response       to care. Recommendation:           - NPO.                           - Continue present medications.                           - Repeat upper endoscopy tomorrow. Procedure Code(s):        --- Professional ---                           [phone], Esophagogastroduodenoscopy, flexible,                            transoral; diagnostic, including collection of                            specimen(s) by  brushing or washing, when performed                            (separate procedure) Diagnosis Code(s):        --- Professional ---                           K92.2, Gastrointestinal hemorrhage, unspecified                           K92.0, Hematemesis CPT copyright 2019 American Medical Association. All rights reserved. The codes documented in this report are preliminary and upon coder review may  be revised to meet current compliance requirements. Otis Brace, MD Otis Brace, MD 02/25/2019 11:26:17 PM Number of Addenda: 0

## 2019-03-03 NOTE — Significant Event (Signed)
Rapid Response Event Note  Overview:Called d/t pt unresponsiveness. While on phone, Code blue was initiated and phone call was lost. On my way to the room, code blue was cancelled. Per RN, pt was found lying sideways in the bed with agonal respirations, when they repositioned him in the bed, he began yelling out in pain and thrashing, SBP-70s.  Time Called: 1932 Event Type: Neurologic  Initial Focused Assessment: Pt thrashing around bed, c/o abd pain. Pt would not point to area of abd that was hurting, he just kept saying "my belly hurts, help me I'm dying."  Pt would move all extremities and follow simple commands. Pupils 2 and slugglish, SBP-70s-1L NS bolus started, HR-69 NSR, RR-30s, SpO2-98% on RA, CBG-74. Skin jaundice, warm and diaphoretic. During assessment, pt began vomiting moderate amount bright red blood. NP Sharlet Salina to bedside. Lab called and to bedside. NP Sharlet Salina consulted GI and PCCM.  PCCM to bedside, 2 units emergency release O neg blood ordered and pt moved to 45M12.   Interventions: 1L NS bolus X 2 CBC, BMP, PT, PTT, T and S, ABG, LA EKG 0.10m ativan and 8 mg zofran given IV 500cc 5% albumin ordered(not given until pt on 45M) 2 units emergency release O neg blood ordered(given on 45M) Pt moved to 45M12  Event Summary: Name of Physician Notified: DSharlet Salina NP at [place]   at    Outcome: Transferred (Comment)(45M12)  Event End Time: 2100  HDillard [place]

## 2019-03-03 NOTE — Progress Notes (Signed)
Pahwani, MD paged because patient is complaining of severe abdominal pain with N/V. Patient stated that he wanted to leave AMA. Dyanne Carrel, NP at bedside to see patient and stated that she would put pain medication orders in for the patient.

## 2019-03-03 NOTE — Progress Notes (Signed)
Dyanne Carrel, NP re-paged for pain medication orders because patient is still in severe pain and is demanding to go AMA. NP stated that she would put orders in. Orders placed.
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
## 2019-03-03 NOTE — Progress Notes (Signed)
[place] Progress Note Patient Name: Glenn Alvarado DOB: 1965/03/12 MRN: [id number]   Date of Service  02/25/2019  HPI/Events of Note  Pt with history of Cirrhosis of the liver complaining of abdominal pain earlier today, then started vomiting bright red blood, became hypotensive and encephalopathic, breathing became agonal at some point and RRT was called. PCCM called for urgent intubation and assumption of care in the ICU. Problems include possible acute abdomen, upper GI bleeding, hemorrhagic shock, and hepatic encephalopathy.  eICU Interventions  New patient evaluation completed, PCCM on the ground to formally consult on him and assume care.        Kerry Kass Rilya Longo 02/04/2019, 9:36 PM

## 2019-03-03 NOTE — Progress Notes (Signed)
Upon entering room patient found lying across the bed unresponsive with agonal breathing. Code blue was initiated, then cancelled. Rapid Response RN Mindy, and NP Sharlet Salina came to bedside. Patients BP 78/50, HR 70, O2 99%, temp 98.4. Patient received 0.75m IV ativan, 472mIV zofran, and 1 L NS bolus x2. At this time patient also started vomiting bright red blood. Patient received orders to be transferred to 2M12.

## 2019-03-04 ENCOUNTER — Inpatient Hospital Stay (HOSPITAL_COMMUNITY): Payer: Medicare HMO

## 2019-03-04 ENCOUNTER — Encounter (HOSPITAL_COMMUNITY): Admission: EM | Disposition: E | Payer: Self-pay | Source: Home / Self Care | Attending: Critical Care Medicine

## 2019-03-04 ENCOUNTER — Encounter (HOSPITAL_COMMUNITY): Payer: Self-pay | Admitting: Anesthesiology

## 2019-03-04 ENCOUNTER — Inpatient Hospital Stay: Payer: Self-pay

## 2019-03-04 DIAGNOSIS — E875 Hyperkalemia: Secondary | ICD-10-CM

## 2019-03-04 DIAGNOSIS — R578 Other shock: Secondary | ICD-10-CM

## 2019-03-04 DIAGNOSIS — Q8789 Other specified congenital malformation syndromes, not elsewhere classified: Secondary | ICD-10-CM

## 2019-03-04 HISTORY — PX: ESOPHAGOGASTRODUODENOSCOPY (EGD) WITH PROPOFOL: SHX5813

## 2019-03-04 LAB — CBC
HCT: 29.7 % — ABNORMAL LOW (ref 39.0–52.0)
HCT: 31.2 % — ABNORMAL LOW (ref 39.0–52.0)
HCT: 28.4 % — ABNORMAL LOW (ref 39.0–52.0)
HCT: 27.5 % — ABNORMAL LOW (ref 39.0–52.0)
Hemoglobin: 10.1 g/dL — ABNORMAL LOW (ref 13.0–17.0)
Hemoglobin: 10.3 g/dL — ABNORMAL LOW (ref 13.0–17.0)
Hemoglobin: 9.5 g/dL — ABNORMAL LOW (ref 13.0–17.0)
Hemoglobin: 9.2 g/dL — ABNORMAL LOW (ref 13.0–17.0)
MCH: 31 pg (ref 26.0–34.0)
MCH: 30.5 pg (ref 26.0–34.0)
MCH: 31 pg (ref 26.0–34.0)
MCH: 30.9 pg (ref 26.0–34.0)
MCHC: 34 g/dL (ref 30.0–36.0)
MCHC: 33 g/dL (ref 30.0–36.0)
MCHC: 33.5 g/dL (ref 30.0–36.0)
MCHC: 33.5 g/dL (ref 30.0–36.0)
MCV: 91.1 fL (ref 80.0–100.0)
MCV: 92.3 fL (ref 80.0–100.0)
MCV: 92.8 fL (ref 80.0–100.0)
MCV: 92.3 fL (ref 80.0–100.0)
Platelets: 98 10*3/uL — ABNORMAL LOW (ref 150–400)
Platelets: 142 10*3/uL — ABNORMAL LOW (ref 150–400)
Platelets: 141 10*3/uL — ABNORMAL LOW (ref 150–400)
Platelets: 121 10*3/uL — ABNORMAL LOW (ref 150–400)
RBC: 3.26 MIL/uL — ABNORMAL LOW (ref 4.22–5.81)
RBC: 3.38 MIL/uL — ABNORMAL LOW (ref 4.22–5.81)
RBC: 3.06 MIL/uL — ABNORMAL LOW (ref 4.22–5.81)
RBC: 2.98 MIL/uL — ABNORMAL LOW (ref 4.22–5.81)
RDW: 18.5 % — ABNORMAL HIGH (ref 11.5–15.5)
RDW: 18.9 % — ABNORMAL HIGH (ref 11.5–15.5)
RDW: 20 % — ABNORMAL HIGH (ref 11.5–15.5)
RDW: 20.1 % — ABNORMAL HIGH (ref 11.5–15.5)
WBC: 11.9 10*3/uL — ABNORMAL HIGH (ref 4.0–10.5)
WBC: 15.9 10*3/uL — ABNORMAL HIGH (ref 4.0–10.5)
WBC: 20.3 10*3/uL — ABNORMAL HIGH (ref 4.0–10.5)
WBC: 17.3 10*3/uL — ABNORMAL HIGH (ref 4.0–10.5)
nRBC: 0.2 % (ref 0.0–0.2)
nRBC: 0 % (ref 0.0–0.2)
nRBC: 0 % (ref 0.0–0.2)
nRBC: 0 % (ref 0.0–0.2)

## 2019-03-04 LAB — CBC WITH DIFFERENTIAL/PLATELET
Abs Immature Granulocytes: 0.23 10*3/uL — ABNORMAL HIGH (ref 0.00–0.07)
Basophils Absolute: 0 10*3/uL (ref 0.0–0.1)
Basophils Relative: 0 %
Eosinophils Absolute: 0 10*3/uL (ref 0.0–0.5)
Eosinophils Relative: 0 %
HCT: 26.7 % — ABNORMAL LOW (ref 39.0–52.0)
Hemoglobin: 8.9 g/dL — ABNORMAL LOW (ref 13.0–17.0)
Immature Granulocytes: 2 %
Lymphocytes Relative: 11 %
Lymphs Abs: 1.6 10*3/uL (ref 0.7–4.0)
MCH: 30.8 pg (ref 26.0–34.0)
MCHC: 33.3 g/dL (ref 30.0–36.0)
MCV: 92.4 fL (ref 80.0–100.0)
Monocytes Absolute: 1.1 10*3/uL — ABNORMAL HIGH (ref 0.1–1.0)
Monocytes Relative: 7 %
Neutro Abs: 11.9 10*3/uL — ABNORMAL HIGH (ref 1.7–7.7)
Neutrophils Relative %: 80 %
Platelets: DECREASED 10*3/uL (ref 150–400)
RBC: 2.89 MIL/uL — ABNORMAL LOW (ref 4.22–5.81)
RDW: 19.4 % — ABNORMAL HIGH (ref 11.5–15.5)
WBC: 14.9 10*3/uL — ABNORMAL HIGH (ref 4.0–10.5)
nRBC: 0 % (ref 0.0–0.2)

## 2019-03-04 LAB — COMPREHENSIVE METABOLIC PANEL
ALT: 464 U/L — ABNORMAL HIGH (ref 0–44)
ALT: 575 U/L — ABNORMAL HIGH (ref 0–44)
AST: 1075 U/L — ABNORMAL HIGH (ref 15–41)
AST: 1437 U/L — ABNORMAL HIGH (ref 15–41)
Albumin: 2.9 g/dL — ABNORMAL LOW (ref 3.5–5.0)
Albumin: 3 g/dL — ABNORMAL LOW (ref 3.5–5.0)
Alkaline Phosphatase: 63 U/L (ref 38–126)
Alkaline Phosphatase: 54 U/L (ref 38–126)
Anion gap: 12 (ref 5–15)
Anion gap: 11 (ref 5–15)
BUN: 34 mg/dL — ABNORMAL HIGH (ref 6–20)
BUN: 39 mg/dL — ABNORMAL HIGH (ref 6–20)
CO2: 15 mmol/L — ABNORMAL LOW (ref 22–32)
CO2: 15 mmol/L — ABNORMAL LOW (ref 22–32)
Calcium: 7.4 mg/dL — ABNORMAL LOW (ref 8.9–10.3)
Calcium: 7.1 mg/dL — ABNORMAL LOW (ref 8.9–10.3)
Chloride: 109 mmol/L (ref 98–111)
Chloride: 112 mmol/L — ABNORMAL HIGH (ref 98–111)
Creatinine, Ser: 1.99 mg/dL — ABNORMAL HIGH (ref 0.61–1.24)
Creatinine, Ser: 2.24 mg/dL — ABNORMAL HIGH (ref 0.61–1.24)
GFR calc Af Amer: 43 mL/min — ABNORMAL LOW (ref >60)
GFR calc Af Amer: 38 mL/min — ABNORMAL LOW (ref >60)
GFR calc non Af Amer: 37 mL/min — ABNORMAL LOW (ref >60)
GFR calc non Af Amer: 32 mL/min — ABNORMAL LOW (ref >60)
Glucose, Bld: 23 mg/dL — CL (ref 70–99)
Glucose, Bld: 58 mg/dL — ABNORMAL LOW (ref 70–99)
Potassium: 7.3 mmol/L (ref 3.5–5.1)
Potassium: 6.1 mmol/L — ABNORMAL HIGH (ref 3.5–5.1)
Sodium: 136 mmol/L (ref 135–145)
Sodium: 138 mmol/L (ref 135–145)
Total Bilirubin: 22.7 mg/dL (ref 0.3–1.2)
Total Bilirubin: 21.2 mg/dL (ref 0.3–1.2)
Total Protein: 6.2 g/dL — ABNORMAL LOW (ref 6.5–8.1)
Total Protein: 5.6 g/dL — ABNORMAL LOW (ref 6.5–8.1)

## 2019-03-04 LAB — RENAL FUNCTION PANEL
Albumin: 3 g/dL — ABNORMAL LOW (ref 3.5–5.0)
Anion gap: 13 (ref 5–15)
BUN: 29 mg/dL — ABNORMAL HIGH (ref 6–20)
CO2: 15 mmol/L — ABNORMAL LOW (ref 22–32)
Calcium: 7.6 mg/dL — ABNORMAL LOW (ref 8.9–10.3)
Chloride: 107 mmol/L (ref 98–111)
Creatinine, Ser: 1.61 mg/dL — ABNORMAL HIGH (ref 0.61–1.24)
GFR calc Af Amer: 56 mL/min — ABNORMAL LOW (ref >60)
GFR calc non Af Amer: 48 mL/min — ABNORMAL LOW (ref >60)
Glucose, Bld: 139 mg/dL — ABNORMAL HIGH (ref 70–99)
Phosphorus: 5.5 mg/dL — ABNORMAL HIGH (ref 2.5–4.6)
Potassium: 4.9 mmol/L (ref 3.5–5.1)
Sodium: 135 mmol/L (ref 135–145)

## 2019-03-04 LAB — GLUCOSE, CAPILLARY
Glucose-Capillary: <10 mg/dL — CL (ref 70–99)
Glucose-Capillary: 64 mg/dL — ABNORMAL LOW (ref 70–99)
Glucose-Capillary: 82 mg/dL (ref 70–99)
Glucose-Capillary: 66 mg/dL — ABNORMAL LOW (ref 70–99)
Glucose-Capillary: 59 mg/dL — ABNORMAL LOW (ref 70–99)
Glucose-Capillary: 103 mg/dL — ABNORMAL HIGH (ref 70–99)
Glucose-Capillary: 165 mg/dL — ABNORMAL HIGH (ref 70–99)
Glucose-Capillary: 117 mg/dL — ABNORMAL HIGH (ref 70–99)
Glucose-Capillary: 103 mg/dL — ABNORMAL HIGH (ref 70–99)
Glucose-Capillary: 193 mg/dL — ABNORMAL HIGH (ref 70–99)

## 2019-03-04 LAB — BASIC METABOLIC PANEL
Anion gap: 8 (ref 5–15)
BUN: 26 mg/dL — ABNORMAL HIGH (ref 6–20)
CO2: 20 mmol/L — ABNORMAL LOW (ref 22–32)
Calcium: 7.4 mg/dL — ABNORMAL LOW (ref 8.9–10.3)
Chloride: 106 mmol/L (ref 98–111)
Creatinine, Ser: 1.76 mg/dL — ABNORMAL HIGH (ref 0.61–1.24)
GFR calc Af Amer: 50 mL/min — ABNORMAL LOW (ref >60)
GFR calc non Af Amer: 43 mL/min — ABNORMAL LOW (ref >60)
Glucose, Bld: 187 mg/dL — ABNORMAL HIGH (ref 70–99)
Potassium: 4.7 mmol/L (ref 3.5–5.1)
Sodium: 134 mmol/L — ABNORMAL LOW (ref 135–145)

## 2019-03-04 LAB — POCT I-STAT EG7
Acid-base deficit: 11 mmol/L — ABNORMAL HIGH (ref 0.0–2.0)
Bicarbonate: 15.8 mmol/L — ABNORMAL LOW (ref 20.0–28.0)
Calcium, Ion: 1.06 mmol/L — ABNORMAL LOW (ref 1.15–1.40)
HCT: 30 % — ABNORMAL LOW (ref 39.0–52.0)
Hemoglobin: 10.2 g/dL — ABNORMAL LOW (ref 13.0–17.0)
O2 Saturation: 71 %
Patient temperature: 35.9
Potassium: 5.2 mmol/L — ABNORMAL HIGH (ref 3.5–5.1)
Sodium: 138 mmol/L (ref 135–145)
TCO2: 17 mmol/L — ABNORMAL LOW (ref 22–32)
pCO2, Ven: 37.1 mmHg — ABNORMAL LOW (ref 44.0–60.0)
pH, Ven: 7.232 — ABNORMAL LOW (ref 7.250–7.430)
pO2, Ven: 41 mmHg (ref 32.0–45.0)

## 2019-03-04 LAB — CULTURE, BLOOD (ROUTINE X 2)
Culture: NO GROWTH
Culture: NO GROWTH
Special Requests: ADEQUATE
Special Requests: ADEQUATE

## 2019-03-04 LAB — BLOOD PRODUCT ORDER (VERBAL) VERIFICATION

## 2019-03-04 LAB — AMMONIA
Ammonia: 97 umol/L — ABNORMAL HIGH (ref 9–35)
Ammonia: 101 umol/L — ABNORMAL HIGH (ref 9–35)

## 2019-03-04 LAB — LACTIC ACID, PLASMA: Lactic Acid, Venous: 4.3 mmol/L (ref 0.5–1.9)

## 2019-03-04 SURGERY — ESOPHAGOGASTRODUODENOSCOPY (EGD) WITH PROPOFOL
Anesthesia: Moderate Sedation

## 2019-03-04 MED ORDER — SODIUM POLYSTYRENE SULFONATE 15 GM/60ML PO SUSP
60.0000 g | Freq: Four times a day (QID) | ORAL | Status: DC
  Filled 2019-03-04 (×2): qty 240

## 2019-03-04 MED ORDER — SODIUM CHLORIDE 0.9% FLUSH
10.0000 mL | Freq: Two times a day (BID) | INTRAVENOUS | Status: DC
  Administered 2019-03-04: 30 mL
  Administered 2019-03-05: 20 mL
  Administered 2019-03-05 – 2019-03-13 (×15): 10 mL

## 2019-03-04 MED ORDER — FENTANYL CITRATE (PF) 100 MCG/2ML IJ SOLN
INTRAMUSCULAR | Status: AC
  Filled 2019-03-04: qty 4

## 2019-03-04 MED ORDER — DIPHENHYDRAMINE HCL 50 MG/ML IJ SOLN
INTRAMUSCULAR | Status: AC
  Filled 2019-03-04: qty 1

## 2019-03-04 MED ORDER — CALCIUM GLUCONATE-NACL 1-0.675 GM/50ML-% IV SOLN
1.0000 g | Freq: Once | INTRAVENOUS | Status: AC
  Administered 2019-03-04: 1000 mg via INTRAVENOUS
  Filled 2019-03-04: qty 50

## 2019-03-04 MED ORDER — HEPARIN SODIUM (PORCINE) 1000 UNIT/ML DIALYSIS
1000.0000 [IU] | INTRAMUSCULAR | Status: DC | PRN
  Administered 2019-03-04 – 2019-03-07 (×2): 3000 [IU] via INTRAVENOUS_CENTRAL
  Filled 2019-03-04: qty 3
  Filled 2019-03-04 (×2): qty 6
  Filled 2019-03-04: qty 3
  Filled 2019-03-04: qty 6

## 2019-03-04 MED ORDER — DEXTROSE 50 % IV SOLN: 25.0000 mL | Freq: Once | INTRAVENOUS | Status: AC

## 2019-03-04 MED ORDER — ALBUMIN HUMAN 5 % IV SOLN
25.0000 g | Freq: Once | INTRAVENOUS | Status: AC
  Administered 2019-03-04: 25 g via INTRAVENOUS
  Filled 2019-03-04: qty 250

## 2019-03-04 MED ORDER — NOREPINEPHRINE 4 MG/250ML-% IV SOLN
INTRAVENOUS | Status: AC
  Administered 2019-03-04: 2 ug/min via INTRAVENOUS
  Filled 2019-03-04: qty 250

## 2019-03-04 MED ORDER — PRISMASOL BGK 4/2.5 32-4-2.5 MEQ/L IV SOLN
INTRAVENOUS | Status: DC
  Filled 2019-03-04 (×40): qty 5000

## 2019-03-04 MED ORDER — SODIUM CHLORIDE 0.9 % FOR CRRT
INTRAVENOUS_CENTRAL | Status: DC | PRN
  Filled 2019-03-04: qty 1000

## 2019-03-04 MED ORDER — ETOMIDATE 2 MG/ML IV SOLN
20.0000 mg | Freq: Once | INTRAVENOUS | Status: AC
  Administered 2019-03-04: 20 mg via INTRAVENOUS

## 2019-03-04 MED ORDER — NOREPINEPHRINE 16 MG/250ML-% IV SOLN
0.0000 ug/min | INTRAVENOUS | Status: DC
  Administered 2019-03-04: 20 ug/min via INTRAVENOUS

## 2019-03-04 MED ORDER — LACTATED RINGERS IV BOLUS
1000.0000 mL | Freq: Once | INTRAVENOUS | Status: AC
  Administered 2019-03-04: 1000 mL via INTRAVENOUS

## 2019-03-04 MED ORDER — CHLORHEXIDINE GLUCONATE 0.12% ORAL RINSE (MEDLINE KIT)
15.0000 mL | Freq: Two times a day (BID) | OROMUCOSAL | Status: DC
  Administered 2019-03-04 – 2019-03-06 (×5): 15 mL via OROMUCOSAL

## 2019-03-04 MED ORDER — DEXTROSE 10 % IV SOLN: INTRAVENOUS | Status: AC

## 2019-03-04 MED ORDER — DEXTROSE 50 % IV SOLN: 12.5000 g | Freq: Once | INTRAVENOUS | Status: AC

## 2019-03-04 MED ORDER — PRISMASOL BGK 0/2.5 32-2.5 MEQ/L REPLACEMENT SOLN
Status: DC
  Filled 2019-03-04 (×3): qty 5000

## 2019-03-04 MED ORDER — SODIUM CHLORIDE 0.9 % IV SOLN
2.0000 g | INTRAVENOUS | Status: DC
  Administered 2019-03-04 – 2019-03-05 (×2): 2 g via INTRAVENOUS
  Filled 2019-03-04 (×2): qty 20

## 2019-03-04 MED ORDER — ORAL CARE MOUTH RINSE
15.0000 mL | OROMUCOSAL | Status: DC
  Administered 2019-03-04 – 2019-03-06 (×27): 15 mL via OROMUCOSAL

## 2019-03-04 MED ORDER — EPINEPHRINE 1 MG/10ML IJ SOSY
PREFILLED_SYRINGE | INTRAMUSCULAR | Status: AC
  Filled 2019-03-04: qty 10

## 2019-03-04 MED ORDER — ALBUMIN HUMAN 25 % IV SOLN
25.0000 g | Freq: Once | INTRAVENOUS | Status: AC
  Administered 2019-03-04: 25 g via INTRAVENOUS
  Filled 2019-03-04: qty 100

## 2019-03-04 MED ORDER — PRISMASOL BGK 4/2.5 32-4-2.5 MEQ/L REPLACEMENT SOLN
Status: DC
  Filled 2019-03-04 (×26): qty 5000

## 2019-03-04 MED ORDER — SODIUM CHLORIDE 0.9% FLUSH
10.0000 mL | INTRAVENOUS | Status: DC | PRN
  Administered 2019-03-06: 10 mL

## 2019-03-04 MED ORDER — DEXTROSE 50 % IV SOLN: 25.0000 g | Freq: Once | INTRAVENOUS | Status: AC

## 2019-03-04 MED ORDER — DEXTROSE 50 % IV SOLN
INTRAVENOUS | Status: AC
  Administered 2019-03-04: 12.5 g via INTRAVENOUS
  Filled 2019-03-04: qty 50

## 2019-03-04 MED ORDER — CALCIUM GLUCONATE-NACL 2-0.675 GM/100ML-% IV SOLN
2.0000 g | Freq: Once | INTRAVENOUS | Status: AC
  Administered 2019-03-04: 2000 mg via INTRAVENOUS
  Filled 2019-03-04: qty 100

## 2019-03-04 MED ORDER — HEPARIN SODIUM (PORCINE) 1000 UNIT/ML DIALYSIS
1000.0000 [IU] | INTRAMUSCULAR | Status: DC | PRN
  Filled 2019-03-04: qty 6

## 2019-03-04 MED ORDER — MIDAZOLAM HCL (PF) 10 MG/2ML IJ SOLN
INTRAMUSCULAR | Status: DC | PRN
  Administered 2019-03-04 (×2): 2 mg via INTRAVENOUS

## 2019-03-04 MED ORDER — MIDAZOLAM HCL (PF) 5 MG/ML IJ SOLN
INTRAMUSCULAR | Status: AC
  Filled 2019-03-04: qty 3

## 2019-03-04 MED ORDER — MIDAZOLAM HCL 2 MG/2ML IJ SOLN
INTRAMUSCULAR | Status: AC
  Administered 2019-03-04: 2 mg via INTRAVENOUS
  Filled 2019-03-04: qty 2

## 2019-03-04 MED ORDER — DEXTROSE 50 % IV SOLN
INTRAVENOUS | Status: AC
  Filled 2019-03-04: qty 50

## 2019-03-04 MED ORDER — SODIUM CHLORIDE 0.9% IV SOLUTION: Freq: Once | INTRAVENOUS | Status: DC

## 2019-03-04 MED ORDER — DEXTROSE 50 % IV SOLN
INTRAVENOUS | Status: AC
  Administered 2019-03-04: 25 g via INTRAVENOUS
  Filled 2019-03-04: qty 50

## 2019-03-04 MED ORDER — MIDAZOLAM HCL 2 MG/2ML IJ SOLN: 2.0000 mg | Freq: Once | INTRAMUSCULAR | Status: AC

## 2019-03-04 MED ORDER — VASOPRESSIN 20 UNIT/ML IV SOLN
0.0300 [IU]/min | INTRAVENOUS | Status: DC
  Administered 2019-03-04 (×3): 0.04 [IU]/min via INTRAVENOUS
  Administered 2019-03-05: 0.03 [IU]/min via INTRAVENOUS
  Filled 2019-03-04 (×5): qty 2

## 2019-03-04 MED ORDER — NOREPINEPHRINE 4 MG/250ML-% IV SOLN
0.0000 ug/min | INTRAVENOUS | Status: DC
  Administered 2019-03-04: 20 ug/min via INTRAVENOUS
  Filled 2019-03-04: qty 250

## 2019-03-04 MED ORDER — VITAMIN K1 10 MG/ML IJ SOLN
5.0000 mg | Freq: Once | INTRAVENOUS | Status: DC
  Filled 2019-03-04: qty 0.5

## 2019-03-04 SURGICAL SUPPLY — 15 items

## 2019-03-04 NOTE — Procedures (Signed)
Hemodialysis Catheter Insertion Procedure Note Glenn Alvarado [id number] 1965/09/12  Procedure: Insertion of Hemodialysis Catheter Indications: Dialysis Access   Procedure Details Consent: Unable to obtain consent because of emergent medical necessity. Time Out: Verified patient identification, verified procedure, site/side was marked, verified correct patient position, special equipment/implants available, medications/allergies/relevent history reviewed, required imaging and test results available.  Performed  Maximum sterile technique was used including antiseptics, cap, gloves, gown, hand hygiene, mask and sheet. Skin prep: Chlorhexidine; local anesthetic administered Triple lumen hemodialysis catheter was inserted into right internal jugular vein using the Seldinger technique.  Evaluation Blood flow good Complications: No apparent complications Patient did tolerate procedure well. Chest X-ray ordered to verify placement.  CXR: normal.   Minda Ditto 02/26/2019

## 2019-03-04 NOTE — Progress Notes (Signed)
Initial Nutrition Assessment   INTERVENTION:  When pt is hemodynamically stable-recommend:  -Vital 1.2 @ 40 ml/hr via OGT and increase by 10 ml every 8 hours to goal rate of 65 ml/hr (1560 ml).   -Tube feeding regimen provides 1872 kcal (99% of needs), 117  grams of protein, and 1265 ml of H2O.     NUTRITION DIAGNOSIS:  Inadequate oral intake related to inability to eat as evidenced by NPO status.  GOAL: Provide needs based on ASPEN/SCCM guidelines  MONITOR:  Vent status, I & O's, Skin, Labs, Weight trends, TF tolerance (if appropriate)    REASON FOR ASSESSMENT:   Ventilator    ASSESSMENT:  RD working remotely.  Patient history of hepatic cirrhosis, Hepatitis A and C, COPD, CAD, CKD-3 and substance abuse. Discharged on 12/26-acute hepatitis and decompensated hepatic cirrhosis. Presented on 12/27 complaining of nausea, vomiting and abdominal pain.   Late last night  vomiting bright red blood, increased jaundice and pulled out IV.Transfered to ICU. Hyperkalemia, hypoglycemia, UGIB. -EGD-repeated -clotted blood in greater stomach and gastric fundus and gastric body  Patient  Intubated-VDRF, shock 12/28 @ 2125.  Requiring pressors, CRRT.  MV: 11.5  L/min Temp (24hrs), Avg:96.9 F (36.1 C), Min:95.7 F (35.4 C), Max:98.4 F (36.9 C)  Sedative: Fentanyl -20 ml/hr     Medications reviewed and include: lactulose.   Labs reviewed: BMP Latest Ref Rng & Units 02/11/2019 02/08/2019 02/26/2019  Glucose 70 - 99 mg/dL 58(L) 23(LL) -  BUN 6 - 20 mg/dL 39(H) 34(H) -  Creatinine 0.61 - 1.24 mg/dL 2.24(H) 1.99(H) -  Sodium 135 - 145 mmol/L 138 136 134(L)  Potassium 3.5 - 5.1 mmol/L 6.1(H) 7.3(HH) 6.8(HH)  Chloride 98 - 111 mmol/L 112(H) 109 -  CO2 22 - 32 mmol/L 15(L) 15(L) -  Calcium 8.9 - 10.3 mg/dL 7.1(L) 7.4(L) -     Intake/Output Summary (Last 24 hours) at 02/11/2019 1300 Last data filed at 02/16/2019 1200 Gross per 24 hour  Intake 3968.54 ml  Output 1472 ml  Net 2496.54  ml     NUTRITION - FOCUSED PHYSICAL EXAM: Unable to complete Nutrition-Focused physical exam at this time.  RD working remotely.  Diet Order:   Diet Order            Diet NPO time specified  Diet effective now              EDUCATION NEEDS:  Not appropriate for education at this time   Skin:  Skin Assessment: Reviewed RN Assessment(jaundiced and MASD to buttocks)  Last BM:  12/29 type 7 -rectal tube -output 675 ml  Height:   Ht Readings from Last 1 Encounters:  02/28/2019 5' 8"  (1.727 m)    Weight:   Wt Readings from Last 1 Encounters:  02/10/2019 80.5 kg    Ideal Body Weight:  70 kg  BMI:  Body mass index is 26.98 kg/m.  Estimated Nutritional Needs:   Kcal:  1893  Protein:  122-129  Fluid:  > 1900 ml daily   Colman Cater MS,RD,CSG,LDN Office: [phone] Pager: [phone]

## 2019-03-04 NOTE — Op Note (Signed)
[facility] Patient Name: Glenn Alvarado Procedure Date : 02/04/2019 MRN: [id number] Attending MD: Wonda Horner , MD Date of Birth: 10-01-1965 CSN: [id number] Age: 53 Admit Type: Inpatient Procedure:                Upper GI endoscopy Indications:              Gastrointestinal bleeding of unknown origin Providers:                Wonda Horner, MD, Benay Pillow, RN, Elspeth Cho Tech., Technician Referring MD:              Medicines:                Midazolam 4 mg IV Complications:            No immediate complications. Estimated Blood Loss:     Estimated blood loss: none. Procedure:                Pre-Anesthesia Assessment:                           - Prior to the procedure, a History and Physical                            was performed, and patient medications and                            allergies were reviewed. The patient's tolerance of                            previous anesthesia was also reviewed. The risks                            and benefits of the procedure and the sedation                            options and risks were discussed with the patient.                            All questions were answered, and informed consent                            was obtained. Prior Anticoagulants: The patient has                            taken no previous anticoagulant or antiplatelet                            agents. ASA Grade Assessment: IV - A patient with                            severe systemic disease that is a constant threat  to life. After reviewing the risks and benefits,                            the patient was deemed in satisfactory condition to                            undergo the procedure.                           After obtaining informed consent, the endoscope was                            passed under direct vision. Throughout the                            procedure, the  patient's blood pressure, pulse, and                            oxygen saturations were monitored continuously. The                            GIF-H190 ([id number]) Olympus gastroscope was                            introduced through the mouth, and advanced to the                            second part of duodenum. The upper GI endoscopy was                            accomplished without difficulty. The patient                            tolerated the procedure well. Scope In: Scope Out: Findings:      The examined esophagus was normal.      Diffuse moderately erythematous mucosa was found in the stomach that       could be seen. Estimated blood loss: none.      Clotted blood was found in the gastric fundus, in the gastric body and       on the greater curvature of the stomach. Large amount of old blood and       clot prevented a good view and it was so thick it could not be suctioned.      The examined duodenum was normal. Impression:               - Normal esophagus.                           - Erythematous mucosa in the stomach.                           - Clotted blood in the greater curvature of the                            stomach, in the gastric  fundus and in the gastric                            body.                           - Normal examined duodenum.                           - No specimens collected. Recommendation:           - NPO.                           - Continue present medications.                           - Continue supportive care. PPI, Octreotide.                            Transfuse as needed. Follow clinically. Procedure Code(s):        --- Professional ---                           [phone], Esophagogastroduodenoscopy, flexible,                            transoral; diagnostic, including collection of                            specimen(s) by brushing or washing, when performed                            (separate procedure) Diagnosis Code(s):        ---  Professional ---                           K31.89, Other diseases of stomach and duodenum                           K92.2, Gastrointestinal hemorrhage, unspecified CPT copyright 2019 American Medical Association. All rights reserved. The codes documented in this report are preliminary and upon coder review may  be revised to meet current compliance requirements. Wonda Horner, MD 02/11/2019 10:32:58 AM This report has been signed electronically. Number of Addenda: 0

## 2019-03-04 NOTE — Progress Notes (Signed)
[place] Progress Note Patient Name: Glenn Alvarado DOB: 1965/10/30 MRN: [id number]   Date of Service  02/15/2019  HPI/Events of Note  Hypotension, Hyperkalemia, Hypoglycemia  eICU Interventions  Albumin 5 % 500 ml iv x 1, Vasopressin infusion, D50 W 1.5 amps iv, D 10 W at 75 ml/hr, Kayexalate 60 gm rectally Q 6 hours x 3 doses, BMET at 7 a.m.        Kerry Kass [place] 03/05/2019, 6:19 AM

## 2019-03-04 NOTE — Procedures (Signed)
I was present at this dialysis session. I have reviewed the session itself and made appropriate changes.   Stable on CRRT. K improving now 6.1, using low K post bath.  TDC working well. Cont to trend K.   Filed Weights   02/22/2019 0226 03/02/2019 1043 03/03/2019 0417  Weight: 80.3 kg 80.3 kg 80.5 kg    Recent Labs  Lab 02/13/2019 0910  NA 138  K 6.1*  CL 112*  CO2 15*  GLUCOSE 58*  BUN 39*  CREATININE 2.24*  CALCIUM 7.1*    Recent Labs  Lab 03/01/19 0439 02/08/2019 0916 03/05/2019 0124 02/14/2019 0354 02/24/2019 0910  WBC 6.0 8.1 11.9* 15.9* 14.9*  NEUTROABS 3.9 5.0  --   --  11.9*  HGB 14.2 13.5 10.1* 10.3* 8.9*  HCT 39.1 38.2* 29.7* 31.2* 26.7*  MCV 84.3 85.8 91.1 92.3 92.4  PLT 119* 164 98* 142* PLATELET CLUMPS NOTED ON SMEAR, COUNT APPEARS DECREASED    Scheduled Meds: . chlorhexidine gluconate (MEDLINE KIT)  15 mL Mouth Rinse BID  . Chlorhexidine Gluconate Cloth  6 each Topical Daily  . dextrose  25 mL Intravenous Once  . dextrose      . feeding supplement (ENSURE ENLIVE)  237 mL Oral TID BM  . ipratropium-albuterol  3 mL Nebulization Q6H  . lactulose  30 g Oral TID  . mouth rinse  15 mL Mouth Rinse 10 times per day  . rifaximin  550 mg Oral BID  . sodium chloride flush  3 mL Intravenous Once   Continuous Infusions: .  prismasol BGK 4/2.5 1,000 mL/hr at 02/14/2019 1033  . sodium chloride Stopped (02/25/2019 1358)  . cefTRIAXone (ROCEPHIN)  IV 200 mL/hr at 02/08/2019 1400  . dextrose 125 mL/hr at 02/27/2019 1400  . fentaNYL infusion INTRAVENOUS 200 mcg/hr (03/05/2019 1400)  . norepinephrine (LEVOPHED) Adult infusion 20 mcg/min (02/13/2019 1400)  . octreotide  (SANDOSTATIN)    IV infusion 50 mcg/hr (02/14/2019 1400)  . pantoprozole (PROTONIX) infusion 8 mg/hr (02/04/2019 1400)  . phenylephrine (NEO-SYNEPHRINE) Adult infusion Stopped (02/14/2019 1359)  . prismasol BGK 0/2.5 300 mL/hr at 03/01/2019 1033  . prismasol BGK 4/2.5 1,500 mL/hr at 02/22/2019 1034  . vasopressin (PITRESSIN) infusion  - *FOR SHOCK* 0.04 Units/min (02/27/2019 1400)   PRN Meds:.albuterol, diphenhydrAMINE, fentaNYL, heparin, heparin, ondansetron **OR** ondansetron (ZOFRAN) IV, sodium chloride      MD 02/15/2019, 2:02 PM   

## 2019-03-04 NOTE — Procedures (Signed)
Central Venous Catheter Insertion Procedure Note Glenn Alvarado [id number] [birth year]  Procedure: Insertion of Central Venous Catheter Indications: shock  Procedure Details Consent: Unable to obtain consent because of emergent medical necessity. Time Out: Verified patient identification, verified procedure, site/side was marked, verified correct patient position, special equipment/implants available, medications/allergies/relevent history reviewed, required imaging and test results available.  Performed  Maximum sterile technique was used including antiseptics, cap, gloves, gown, hand hygiene, mask and sheet. Skin prep: Chlorhexidine; local anesthetic administered A antimicrobial bonded/coated triple lumen catheter was placed in the left internal jugular vein using the Seldinger technique.  Evaluation Blood flow good Complications: No apparent complications Patient did tolerate procedure well. Chest X-ray ordered to verify placement.  CXR: normal.  Minda Ditto 03/06/2019, 9:54 AM

## 2019-03-04 NOTE — Progress Notes (Signed)
eLink Physician-Brief Progress Note Patient Name: Skyy Mcknight DOB: Oct 04, 1965 MRN: [id number]   Date of Service  02/23/2019  HPI/Events of Note  RN reports that Pt has a soft blood pressure  eICU Interventions  Albumin 25 % 25 gm iv bolus x 1        Doss Cybulski U Shyla Gayheart 02/04/2019, 12:48 AM

## 2019-03-04 NOTE — Consult Note (Signed)
NAME:  Glenn Alvarado, MRN:  [id number], DOB:  1966-01-28, LOS: 1 ADMISSION DATE:  02/21/2019, CONSULTATION DATE:  12/28 REFERRING MD:  Dr. Doristine Bosworth, CHIEF COMPLAINT:  Shock, hematemesis    Brief History   53 year old male with history of hepatitis related hepatic cirrhosis.  Admitted 12/27 with nausea vomiting and abdominal pain.  In the p.m. hours of 12/28 he developed a large volume hematemesis and circulatory shock.  History of present illness   Patient is encephalopathic and/or intubated. Therefore history has been obtained from chart review.  53 year old male with past medical history as below, which is significant for hepatitis B, C, hepatic cirrhosis, COPD, CAD, and substance abuse.  He was recently admitted to [facility] for acute hepatitis A.  Course was complicated by hepatic encephalopathy and he was discharged home on lactulose on 12/26.  He was then readmitted on 12/27 after family members noted him to be confused with multiple episodes of vomiting.  In the emergency department he was complaining of right lower quadrant abdominal pain.  He was admitted to the hospitalist service for treatment of hepatic encephalopathy.  He was started on Xifaxan and lactulose.  Then in the p.m. hours of 12/28 he continued to complain of abdominal pain and developed large-volume hematemesis.  As result his blood pressure fell to systolics in the 28U.  Following resuscitation was initiated he was transferred to ICU for further evaluation.   Past Medical History   has a past medical history of Arthritis, Bleeds easily ([place]), CAD (coronary artery disease), Chronic back pain, COPD (chronic obstructive pulmonary disease) ([place]), Daily headache, Depression, Depression with anxiety, Essential hypertension, GERD (gastroesophageal reflux disease), Hepatitis B, Hepatitis C, HLD (hyperlipidemia), Myocardial infarction ([place]), Pneumonia, Substance abuse ([place]), and Tobacco abuse.   Significant Hospital Events     12/22 > 12/16 admit for acute hepatitis A 12/27 > admit for hepatic encephalopathy and abdominal pain 12/28 > upper GI bleeding, shock, ICU transfer. EGD unable to visualize source of bleed 12/29 > Central line and vas cath placed for hyperkalemia  Consults:  GI 12/28   Procedures:  EGD 12/28 unable to visualize source of bleed due to blood clots. No esophageal varices or ulcerations.   Significant Diagnostic Tests:  12/28 CT abdomen > Bilateral nonobstructive nephrolithiasis. No hydronephrosis or renal obstruction is noted. Findings consistent with hepatic cirrhosis. Severe gallbladder distension is noted with large gallstone in the neck of the gallbladder. Enlarged collateral veins are seen in the left upper quadrant consistent with portal hypertension.  Micro Data:  SARS COV 2 12/17 > negative BCX 12/29 > Antimicrobials:  CTX 12/28 >  Interim history/subjective:  Remains critically ill. On mechanical ventilation. Increased pressor requirement.   S/p EGD with no identified source of bleeding.  Objective   Blood pressure (!) 85/53, pulse 66, temperature (!) 96.4 F (35.8 C), resp. rate 18, height 5' 8"  (1.727 m), weight 80.5 kg, SpO2 100 %.    Vent Mode: PRVC FiO2 (%):  [40 %-100 %] 40 % Set Rate:  [18 bmp] 18 bmp Vt Set:  [540 mL] 540 mL PEEP:  [5 cmH20] 5 cmH20 Plateau Pressure:  [15 cmH20] 15 cmH20   Intake/Output Summary (Last 24 hours) at 02/04/2019 0809 Last data filed at 03/01/2019 0600 Gross per 24 hour  Intake 2840.13 ml  Output 1960 ml  Net 880.13 ml   Filed Weights   02/10/2019 0226 03/05/2019 1043 02/18/2019 0417  Weight: 80.3 kg 80.3 kg 80.5 kg    Physical Exam:  General: Chronically ill-appearing, sedated HENT: [place], AT, ETT in place Eyes: EOMI, no scleral icterus Respiratory: Vented breath sounds.  No crackles, wheezing or rales Cardiovascular: RRR, -M/R/G, no JVD GI: BS+, soft, nontender Extremities:-Edema,-tenderness Neuro: AAO x4, CNII-XII grossly  intact Skin: Jaundiced GU: Foley in place  Resolved Hospital Problem list     Assessment & Plan:   53 year old male with cirrhosis secondary to hepatitis A/B/C, COPD and polysubstance abuse who was admitted for hepatic encephalopathy. Overnight he reported abdominal pain and had hematemesis requiring transfer to the ICU.  Hemorrhagic shock secondary to upper GI bleed S/p PRBC x 2. EGD 12/28 unable to visualize source of bleed due to blood clots. No esophageal varices or ulcerations. -Maintain MAP >65: Wean levophed, neo and vasopressin -Transfuse for Hg <7 or active bleeding -PPI gtt -Octreotide gtt -CBC q6h -Daily INR/PT -Continue Ceftriaxone -GI following. Plan for repeat EGD today  Acute hypoxemic respiratory failure secondary to critical illness, airway protection -Full vent support. No plan to wean in setting of acute illness  Acute hepatic encephalopathy -Resume lactulose and rifaximinwhen enteral access is obtained -PAD protocol for mechanical ventilation. RASS goal -4: fentanyl  AKI with metabolic acidosis Hyperkalemia Unable to administer rectally or orally. May need to dialysis to electrolyte abnormalities -Temporize with calcium gluconate and glucose -Consult Nephrology -Place Vas Cath  Hypoglycemia  -D10 gtt  Cirrhosis secondary hepatitis B/C Recently treated for acute hepatitis A -Supportive management  Transaminitis Likely ischemic -Trend CMP  COPD -Nebulizers  Best practice:  Diet: NPO Pain/Anxiety/Delirium protocol (if indicated): PAD prtococl VAP protocol (if indicated): yes DVT prophylaxis: NA GI prophylaxis: PPI gtt Glucose control: SSI Mobility: BR Code Status: FULL Family Communication: Updated sister and step-father via phone.  Disposition: ICU  Labs   CBC: Recent Labs  Lab 02/25/19 1411 02/25/19 1435 02/27/19 0303 02/28/19 0417 03/01/19 0439 02/11/2019 0128 02/04/2019 0916 03/05/2019 2020 02/19/2019 2318 02/05/2019 0124  02/24/2019 0354  WBC 5.7   < > 4.8 3.9* 6.0 6.5 8.1 9.5  --  11.9* 15.9*  NEUTROABS 3.6  --  2.5 2.6 3.9  --  5.0  --   --   --   --   HGB 15.5  --  12.0* 12.3* 14.2 13.7 13.5 11.1* 10.2* 10.1* 10.3*  HCT 45.5  --  34.5* 34.1* 39.1 39.1 38.2* 31.6* 30.0* 29.7* 31.2*  MCV 89.6   < > 85.6 84.4 84.3 87.7 85.8 87.8  --  91.1 92.3  PLT 148*   < > 118* 100* 119* 103* 164 138*  --  98* 142*   < > = values in this interval not displayed.    Basic Metabolic Panel: Recent Labs  Lab 03/01/19 0439 02/28/2019 1624 03/01/2019 0128 02/22/2019 0916 02/13/2019 2022 03/05/2019 2318 02/27/2019 0354  NA 133* 131*  --  133* 134* 134* 136  K 4.4 4.5  --  4.6 5.3* 6.8* 7.3*  CL 104 103  --  102 110  --  109  CO2 19* 20*  --  18* 13*  --  15*  GLUCOSE 92 97  --  91 108*  --  23*  BUN 24* 20  --  25* 27*  --  34*  CREATININE 1.48* 1.41* 0.99 1.62* 1.71*  --  1.99*  CALCIUM 8.4* 8.6*  --  8.5* 7.7*  --  7.4*   GFR: Estimated Creatinine Clearance: 42 mL/min (A) (by C-G formula based on SCr of 1.99 mg/dL (H)). Recent Labs  Lab 02/19/2019 [phone] 03/06/2019  2020 02/15/2019 2025 02/23/2019 0124 02/16/2019 0354  WBC 8.1 9.5  --  11.9* 15.9*  LATICACIDVEN  --   --  5.2* 4.3*  --     Liver Function Tests: Recent Labs  Lab 02/28/19 0417 03/01/19 0439 02/05/2019 1624 02/15/2019 0916 02/24/2019 0354  AST 446* 484* 409* 371* 1,075*  ALT 534* 537* 482* 369* 464*  ALKPHOS 91 97 115 92 63  BILITOT 15.4* 18.9* 25.8* 25.3* 22.7*  PROT 6.4* 7.5 8.3* 7.8 6.2*  ALBUMIN 2.3* 2.6* 2.8* 2.5* 2.9*   Recent Labs  Lab 02/25/19 1411 02/27/19 0303 03/01/2019 1624 02/25/2019 0916  LIPASE 30 33 26 32   Recent Labs  Lab 02/27/19 0304 02/27/2019 2124 02/25/2019 0916 02/19/2019 0354  AMMONIA 42* 43* 61* 97*    ABG    Component Value Date/Time   PHART 7.323 (L) 02/06/2019 2318   PCO2ART 33.4 02/04/2019 2318   PO2ART 345.0 (H) 02/09/2019 2318   HCO3 17.5 (L) 02/06/2019 2318   TCO2 19 (L) 02/20/2019 2318   ACIDBASEDEF 8.0 (H) 02/17/2019  2318   O2SAT 100.0 02/23/2019 2318     Coagulation Profile: Recent Labs  Lab 02/25/19 1411 02/27/19 0303 02/26/2019 1755 02/09/2019 2022  INR 1.4* 1.5* 1.4* 1.7*    Cardiac Enzymes: No results for input(s): CKTOTAL, CKMB, CKMBINDEX, TROPONINI in the last 168 hours.  HbA1C: Hgb A1c MFr Bld  Date/Time Value Ref Range Status  07/23/2017 06:21 AM 4.4 (L) 4.8 - 5.6 % Final    Comment:    (NOTE) Pre diabetes:          5.7%-6.4% Diabetes:              >6.4% Glycemic control for   <7.0% adults with diabetes     CBG: Recent Labs  Lab 02/04/2019 1934 03/01/2019 0526 02/25/2019 0547 03/06/2019 0618 02/05/2019 0737  GLUCAP 74 <10* 64* 82 66*   Critical care time: 60 min   The patient is critically ill with multiple organ systems failure and requires high complexity decision making for assessment and support, frequent evaluation and titration of therapies, application of advanced monitoring technologies and extensive interpretation of multiple databases.   Critical Care Time devoted to patient care services described in this note is 60 Minutes. This time reflects time of care of this signee Dr. Rodman Pickle.   Rodman Pickle, M.D. [facility] Pulmonary/Critical Care Medicine 02/14/2019 8:11 AM   Please see Amion for pager number to reach on-call Pulmonary and Critical Care Team.

## 2019-03-04 NOTE — H&P (Signed)
Patient for repeat EGD for ugi bleed. Intubated, jaundiced. Heart RRR.  For EGD

## 2019-03-04 NOTE — Progress Notes (Signed)
CRITICAL VALUE ALERT  Critical Value:  Potassium 7.3. Blood glucose 23 with follow up CBG reading Low.   Date & Time Notied:  03/01/2019 @ 0532  Provider Notified: Warren Lacy RN  Orders Received/Actions taken: 1 amp dextrose given. MD to be notified.

## 2019-03-04 NOTE — Progress Notes (Signed)
19:45 RN paged NP due to approximately 2-3 minutes of patient being unresponsive.  When I arrived, Rapid Response RN was present and had ordered stat labs and NS bolus.  Pt was thrashing around on the bed and yelling about his belly hurting but would not point to an area of pain.  He was quite jaundiced, and per bedside RN, more so than on admission.  Diaphoretic, stating "don't let me die". SROM.  Moderate reducible umbilical hernia and moderate tenderness to palpation.  Pt pulled out his second IV.  He started vomiting small amounts of BRB, which increased in amount.  HR 60s, SBP 70s, CBG 74, O2 sats 99% on RA.  EKG showed NSR.  Paged GI and called E-link for transfer to ICU.  PCCM to bedside and pt moved to 2M12.
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
## 2019-03-04 NOTE — Progress Notes (Deleted)
CRITICAL VALUE ALERT  Critical Value:  INR 7.5  Date & Time Notied:  02/11/2019 @ 8950  Provider Notified: Warren Lacy RN  Orders Received/Actions taken: Will notify MD.

## 2019-03-05 ENCOUNTER — Inpatient Hospital Stay (HOSPITAL_COMMUNITY): Payer: Medicare HMO

## 2019-03-05 DIAGNOSIS — K7460 Unspecified cirrhosis of liver: Secondary | ICD-10-CM

## 2019-03-05 LAB — CBC
HCT: 26.9 % — ABNORMAL LOW (ref 39.0–52.0)
HCT: 26 % — ABNORMAL LOW (ref 39.0–52.0)
Hemoglobin: 9 g/dL — ABNORMAL LOW (ref 13.0–17.0)
Hemoglobin: 8.8 g/dL — ABNORMAL LOW (ref 13.0–17.0)
MCH: 30.8 pg (ref 26.0–34.0)
MCH: 31.3 pg (ref 26.0–34.0)
MCHC: 33.5 g/dL (ref 30.0–36.0)
MCHC: 33.8 g/dL (ref 30.0–36.0)
MCV: 92.1 fL (ref 80.0–100.0)
MCV: 92.5 fL (ref 80.0–100.0)
Platelets: 101 10*3/uL — ABNORMAL LOW (ref 150–400)
Platelets: 85 10*3/uL — ABNORMAL LOW (ref 150–400)
RBC: 2.92 MIL/uL — ABNORMAL LOW (ref 4.22–5.81)
RBC: 2.81 MIL/uL — ABNORMAL LOW (ref 4.22–5.81)
RDW: 20.3 % — ABNORMAL HIGH (ref 11.5–15.5)
RDW: 20.5 % — ABNORMAL HIGH (ref 11.5–15.5)
WBC: 15.5 10*3/uL — ABNORMAL HIGH (ref 4.0–10.5)
WBC: 12.5 10*3/uL — ABNORMAL HIGH (ref 4.0–10.5)
nRBC: 0 % (ref 0.0–0.2)
nRBC: 0 % (ref 0.0–0.2)

## 2019-03-05 LAB — COMPREHENSIVE METABOLIC PANEL
ALT: 1784 U/L — ABNORMAL HIGH (ref 0–44)
AST: 6497 U/L — ABNORMAL HIGH (ref 15–41)
Albumin: 2.8 g/dL — ABNORMAL LOW (ref 3.5–5.0)
Alkaline Phosphatase: 69 U/L (ref 38–126)
Anion gap: 7 (ref 5–15)
BUN: 23 mg/dL — ABNORMAL HIGH (ref 6–20)
CO2: 23 mmol/L (ref 22–32)
Calcium: 7.6 mg/dL — ABNORMAL LOW (ref 8.9–10.3)
Chloride: 105 mmol/L (ref 98–111)
Creatinine, Ser: 1.71 mg/dL — ABNORMAL HIGH (ref 0.61–1.24)
GFR calc Af Amer: 52 mL/min — ABNORMAL LOW (ref >60)
GFR calc non Af Amer: 45 mL/min — ABNORMAL LOW (ref >60)
Glucose, Bld: 107 mg/dL — ABNORMAL HIGH (ref 70–99)
Potassium: 4.6 mmol/L (ref 3.5–5.1)
Sodium: 135 mmol/L (ref 135–145)
Total Bilirubin: 27.1 mg/dL (ref 0.3–1.2)
Total Protein: 5.9 g/dL — ABNORMAL LOW (ref 6.5–8.1)

## 2019-03-05 LAB — POCT I-STAT EG7
Acid-base deficit: 9 mmol/L — ABNORMAL HIGH (ref 0.0–2.0)
Bicarbonate: 18.1 mmol/L — ABNORMAL LOW (ref 20.0–28.0)
Calcium, Ion: 1.08 mmol/L — ABNORMAL LOW (ref 1.15–1.40)
HCT: 29 % — ABNORMAL LOW (ref 39.0–52.0)
Hemoglobin: 9.9 g/dL — ABNORMAL LOW (ref 13.0–17.0)
O2 Saturation: 67 %
Patient temperature: 35.7
Potassium: 4.9 mmol/L (ref 3.5–5.1)
Sodium: 138 mmol/L (ref 135–145)
TCO2: 19 mmol/L — ABNORMAL LOW (ref 22–32)
pCO2, Ven: 40.9 mmHg — ABNORMAL LOW (ref 44.0–60.0)
pH, Ven: 7.249 — ABNORMAL LOW (ref 7.250–7.430)
pO2, Ven: 38 mmHg (ref 32.0–45.0)

## 2019-03-05 LAB — GLUCOSE, CAPILLARY
Glucose-Capillary: 108 mg/dL — ABNORMAL HIGH (ref 70–99)
Glucose-Capillary: 111 mg/dL — ABNORMAL HIGH (ref 70–99)
Glucose-Capillary: 112 mg/dL — ABNORMAL HIGH (ref 70–99)
Glucose-Capillary: 87 mg/dL (ref 70–99)
Glucose-Capillary: 89 mg/dL (ref 70–99)
Glucose-Capillary: 97 mg/dL (ref 70–99)

## 2019-03-05 LAB — PROTIME-INR
INR: 3.2 — ABNORMAL HIGH (ref 0.8–1.2)
Prothrombin Time: 32.8 seconds — ABNORMAL HIGH (ref 11.4–15.2)

## 2019-03-05 LAB — RENAL FUNCTION PANEL
Albumin: 2.7 g/dL — ABNORMAL LOW (ref 3.5–5.0)
Anion gap: 6 (ref 5–15)
BUN: 22 mg/dL — ABNORMAL HIGH (ref 6–20)
CO2: 25 mmol/L (ref 22–32)
Calcium: 7.6 mg/dL — ABNORMAL LOW (ref 8.9–10.3)
Chloride: 103 mmol/L (ref 98–111)
Creatinine, Ser: 1.72 mg/dL — ABNORMAL HIGH (ref 0.61–1.24)
GFR calc Af Amer: 52 mL/min — ABNORMAL LOW
GFR calc non Af Amer: 45 mL/min — ABNORMAL LOW
Glucose, Bld: 104 mg/dL — ABNORMAL HIGH (ref 70–99)
Phosphorus: 3.6 mg/dL (ref 2.5–4.6)
Potassium: 4.4 mmol/L (ref 3.5–5.1)
Sodium: 134 mmol/L — ABNORMAL LOW (ref 135–145)

## 2019-03-05 LAB — MAGNESIUM
Magnesium: 2.3 mg/dL (ref 1.7–2.4)
Magnesium: 2.2 mg/dL (ref 1.7–2.4)
Magnesium: 2.2 mg/dL (ref 1.7–2.4)

## 2019-03-05 LAB — PHOSPHORUS
Phosphorus: 4.7 mg/dL — ABNORMAL HIGH (ref 2.5–4.6)
Phosphorus: 3.8 mg/dL (ref 2.5–4.6)

## 2019-03-05 LAB — POTASSIUM: Potassium: 4.3 mmol/L (ref 3.5–5.1)

## 2019-03-05 MED ORDER — PRO-STAT SUGAR FREE PO LIQD
30.0000 mL | Freq: Two times a day (BID) | ORAL | Status: DC
  Administered 2019-03-05 – 2019-03-06 (×3): 30 mL
  Filled 2019-03-05 (×3): qty 30

## 2019-03-05 MED ORDER — VITAMIN K1 10 MG/ML IJ SOLN
5.0000 mg | Freq: Once | INTRAVENOUS | Status: AC
  Administered 2019-03-05: 5 mg via INTRAVENOUS
  Filled 2019-03-05: qty 0.5

## 2019-03-05 MED ORDER — VITAL HIGH PROTEIN PO LIQD
1000.0000 mL | ORAL | Status: DC
  Administered 2019-03-05: 1000 mL

## 2019-03-05 MED ORDER — VITAL AF 1.2 CAL PO LIQD
1000.0000 mL | ORAL | Status: DC
  Administered 2019-03-05: 1000 mL

## 2019-03-05 MED ORDER — PRISMASOL BGK 4/2.5 32-4-2.5 MEQ/L REPLACEMENT SOLN
Status: DC
  Filled 2019-03-05 (×7): qty 5000

## 2019-03-05 MED ORDER — LACTULOSE 10 GM/15ML PO SOLN
30.0000 g | Freq: Three times a day (TID) | ORAL | Status: DC
  Administered 2019-03-05 – 2019-03-06 (×5): 30 g
  Filled 2019-03-05 (×5): qty 45

## 2019-03-05 MED ORDER — RIFAXIMIN 550 MG PO TABS
550.0000 mg | ORAL_TABLET | Freq: Two times a day (BID) | ORAL | Status: DC
  Administered 2019-03-05 – 2019-03-06 (×3): 550 mg
  Filled 2019-03-05 (×4): qty 1

## 2019-03-05 MED ORDER — DEXMEDETOMIDINE HCL IN NACL 400 MCG/100ML IV SOLN
0.2000 ug/kg/h | INTRAVENOUS | Status: DC
  Administered 2019-03-05: 0.2 ug/kg/h via INTRAVENOUS
  Filled 2019-03-05: qty 100

## 2019-03-05 NOTE — Progress Notes (Signed)
NAME:  Glenn Alvarado, MRN:  [id number], DOB:  [birth year], LOS: 2 ADMISSION DATE:  02/19/2019, CONSULTATION DATE:  12/28 REFERRING MD:  Dr. Doristine Bosworth, CHIEF COMPLAINT:  Shock, hematemesis    Brief History   53 year old male with history of hepatitis related hepatic cirrhosis.  Admitted 12/27 with nausea vomiting and abdominal pain.  In the p.m. hours of 12/28 he developed a large volume hematemesis and circulatory shock.  History of present illness   Patient is encephalopathic and/or intubated. Therefore history has been obtained from chart review.  53 year old male with past medical history as below, which is significant for hepatitis B, C, hepatic cirrhosis, COPD, CAD, and substance abuse.  He was recently admitted to [facility] for acute hepatitis A.  Course was complicated by hepatic encephalopathy and he was discharged home on lactulose on 12/26.  He was then readmitted on 12/27 after family members noted him to be confused with multiple episodes of vomiting.  In the emergency department he was complaining of right lower quadrant abdominal pain.  He was admitted to the hospitalist service for treatment of hepatic encephalopathy.  He was started on Xifaxan and lactulose.  Then in the p.m. hours of 12/28 he continued to complain of abdominal pain and developed large-volume hematemesis.  As result his blood pressure fell to systolics in the 12X.  Following resuscitation was initiated he was transferred to ICU for further evaluation.   Past Medical History   has a past medical history of Arthritis, Bleeds easily ([place]), CAD (coronary artery disease), Chronic back pain, COPD (chronic obstructive pulmonary disease) ([place]), Daily headache, Depression, Depression with anxiety, Essential hypertension, GERD (gastroesophageal reflux disease), Hepatitis B, Hepatitis C, HLD (hyperlipidemia), Myocardial infarction ([place]), Pneumonia, Substance abuse ([place]), and Tobacco abuse.   Significant Hospital Events     12/22 > 12/16 admit for acute hepatitis A 12/27 > admit for hepatic encephalopathy and abdominal pain 12/28 > upper GI bleeding, shock, ICU transfer. EGD unable to visualize source of bleed 12/29 > Central line and vas cath placed for hyperkalemia  Consults:  GI 12/28   Procedures:  EGD 12/28 unable to visualize source of bleed due to blood clots. No esophageal varices or ulcerations.   Significant Diagnostic Tests:  12/28 CT abdomen > Bilateral nonobstructive nephrolithiasis. No hydronephrosis or renal obstruction is noted. Findings consistent with hepatic cirrhosis. Severe gallbladder distension is noted with large gallstone in the neck of the gallbladder. Enlarged collateral veins are seen in the left upper quadrant consistent with portal hypertension.  Micro Data:  SARS COV 2 12/17 > negative BCX 12/29 > Antimicrobials:  CTX 12/28 >  Interim history/subjective:  Weaned off levophed and neo. On wake up assessment, moves extremities x 4, does not follow commands.  Objective   Blood pressure 106/70, pulse 80, temperature 98.4 F (36.9 C), resp. rate 14, height 5' 8"  (1.727 m), weight 85.6 kg, SpO2 100 %. CVP:  [8 mmHg] 8 mmHg  Vent Mode: PRVC FiO2 (%):  [40 %-80 %] 40 % Set Rate:  [18 bmp] 18 bmp Vt Set:  [540 mL] 540 mL PEEP:  [5 cmH20] 5 cmH20 Plateau Pressure:  [10 NTZ00-17 cmH20] 24 cmH20   Intake/Output Summary (Last 24 hours) at 03/05/2019 0726 Last data filed at 03/05/2019 0700 Gross per 24 hour  Intake 6500.94 ml  Output 5294 ml  Net 1206.94 ml   Filed Weights   02/23/2019 1043 02/18/2019 0417 03/05/19 0415  Weight: 80.3 kg 80.5 kg 85.6 kg    Physical  Exam: General: Chronically ill-appearing, sedated HENT: [place], AT, ETT in place Eyes: EOMI, no scleral icterus Respiratory: Vented breath sounds.  No crackles, wheezing or rales Cardiovascular: RRR, -M/R/G, no JVD GI: BS+, soft, nontender Extremities:-Edema,-tenderness Neuro: AAO x4, CNII-XII grossly  intact Skin: Jaundiced GU: Foley in place  Resolved Hospital Problem list     Assessment & Plan:   53 year old male with cirrhosis secondary ASH/NASH/hepatitis, history of hepatitis A/B/C with recent admission for decompensated cirrhosis related to acute hepatitis A, history of alcohol abuse (reportedly quit 15 years ago) COPD and polysubstance abuse who was admitted for hepatic encephalopathy. Overnight he reported abdominal pain and had hematemesis requiring transfer to the ICU. MELD score 26 and MDF score 35 on admission. Doubt this is alcoholic hepatitis based on extremely elevated LFTs and absence of recent alcohol history.  Hemorrhagic shock secondary to upper GI bleed - unclear source. Improved hemodynamics. S/p PRBC x 2. EGD 12/28 unable to visualize source of bleed due to blood clots. No esophageal varices or ulcerations. -Maintain MAP >65: Wean levophed, neo and vasopressin -Transfuse for Hg <7 or active bleeding -Continue PPI gtt x 72 hours followed by BID -Octreotide gtt x 72 hours -Decreased CBC to q12h -Daily INR/PT -Continue Ceftriaxone -GI following  Acute liver failure Acute hepatic encephalopathy Cirrhosis secondary alcoholic steatohepatitis/NASH/hepatitis Recent hepatitis A infection Coagulopathy Recently treated for acute hepatitis A. Per [facility] outpatient notes, his candidacy for liver transplant barriers would include active smoking (2 cigarettes/day) -After discussion with GI regarding if patient appropriate for transfer for evaluation of transplant, we will contact [facility] today -Daily CMP, PT/INR -RUQ Abdominal US -Resume lactulose when enteral access is obtained -Vitamin K today for elevated INR  Acute hypoxemic respiratory failure secondary to critical illness, airway protection -Remain on full vent support -SBT/WUA daily. However no plan to extubate in setting of acute illness  AKI with metabolic acidosis Hyperkalemia - resolved Unable to  administer rectally or orally. May need to dialysis to electrolyte abnormalities -Appreciate Nephrology input. On CRRT.  Hypoglycemia  -D10 gtt  COPD -Nebulizers  Best practice:  Diet: NPO, attempt to obtain enteral access Pain/Anxiety/Delirium protocol (if indicated): PAD protocol VAP protocol (if indicated): yes DVT prophylaxis: NA GI prophylaxis: PPI gtt Glucose control: SSI Mobility: BR Code Status: FULL Family Communication: Updated family 12/30.  Disposition: ICU  Labs   CBC: Recent Labs  Lab 02/27/19 0303 02/28/19 0417 03/01/19 0439 02/08/2019 0916 02/13/2019 0354 02/28/2019 0910 02/24/2019 1431 02/16/2019 1807 02/07/2019 1944 02/22/2019 2300 03/05/19 0454  WBC 4.8 3.9* 6.0 8.1 15.9* 14.9*  --   --  20.3* 17.3* 15.5*  NEUTROABS 2.5 2.6 3.9 5.0  --  11.9*  --   --   --   --   --   HGB 12.0* 12.3* 14.2 13.5 10.3* 8.9* 10.2* 9.9* 9.5* 9.2* 9.0*  HCT 34.5* 34.1* 39.1 38.2* 31.2* 26.7* 30.0* 29.0* 28.4* 27.5* 26.9*  MCV 85.6 84.4 84.3 85.8 92.3 92.4  --   --  92.8 92.3 92.1  PLT 118* 100* 119* 164 142* PLATELET CLUMPS NOTED ON SMEAR, COUNT APPEARS DECREASED  --   --  141* 121* 101*    Basic Metabolic Panel: Recent Labs  Lab 02/18/2019 0354 02/04/2019 0910 02/25/2019 1431 02/08/2019 1807 02/28/2019 1944 02/08/2019 2300 03/05/19 0454  NA 136 138 138 138 135 134* 135  K 7.3* 6.1* 5.2* 4.9 4.9 4.7 4.6  CL 109 112*  --   --  107 106 105  CO2 15* 15*  --   --  15* 20* 23  GLUCOSE 23* 58*  --   --  139* 187* 107*  BUN 34* 39*  --   --  29* 26* 23*  CREATININE 1.99* 2.24*  --   --  1.61* 1.76* 1.71*  CALCIUM 7.4* 7.1*  --   --  7.6* 7.4* 7.6*  MG  --   --   --   --   --   --  2.3  PHOS  --   --   --   --  5.5*  --  4.7*   GFR: Estimated Creatinine Clearance: 53.8 mL/min (A) (by C-G formula based on SCr of 1.71 mg/dL (H)). Recent Labs  Lab 02/20/2019 2025 02/21/2019 0124 02/04/2019 0910 02/14/2019 1944 02/22/2019 2300 03/05/19 0454  WBC  --  11.9* 14.9* 20.3* 17.3* 15.5*   LATICACIDVEN 5.2* 4.3*  --   --   --   --     Liver Function Tests: Recent Labs  Lab 02/07/2019 1624 02/04/2019 0916 02/20/2019 0354 02/21/2019 0910 02/24/2019 1944 03/05/19 0454  AST 409* 371* 1,075* 1,437*  --  6,497*  ALT 482* 369* 464* 575*  --  1,784*  ALKPHOS 115 92 63 54  --  69  BILITOT 25.8* 25.3* 22.7* 21.2*  --  27.1*  PROT 8.3* 7.8 6.2* 5.6*  --  5.9*  ALBUMIN 2.8* 2.5* 2.9* 3.0* 3.0* 2.8*   Recent Labs  Lab 02/27/19 0303 02/20/2019 1624 02/22/2019 0916  LIPASE 33 26 32   Recent Labs  Lab 02/27/19 0304 02/04/2019 2124 02/22/2019 0916 02/09/2019 0354 02/27/2019 2311  AMMONIA 42* 43* 61* 97* 101*    ABG    Component Value Date/Time   PHART 7.323 (L) 02/15/2019 2318   PCO2ART 33.4 02/09/2019 2318   PO2ART 345.0 (H) 02/21/2019 2318   HCO3 18.1 (L) 02/17/2019 1807   TCO2 19 (L) 02/14/2019 1807   ACIDBASEDEF 9.0 (H) 02/25/2019 1807   O2SAT 67.0 02/12/2019 1807     Coagulation Profile: Recent Labs  Lab 02/27/19 0303 02/25/2019 1755 02/16/2019 2022 03/05/19 0454  INR 1.5* 1.4* 1.7* 3.2*    Cardiac Enzymes: No results for input(s): CKTOTAL, CKMB, CKMBINDEX, TROPONINI in the last 168 hours.  HbA1C: Hgb A1c MFr Bld  Date/Time Value Ref Range Status  07/23/2017 06:21 AM 4.4 (L) 4.8 - 5.6 % Final    Comment:    (NOTE) Pre diabetes:          5.7%-6.4% Diabetes:              >6.4% Glycemic control for   <7.0% adults with diabetes     CBG: Recent Labs  Lab 03/01/2019 1805 03/01/2019 1958 03/05/2019 2142 02/12/2019 2325 03/05/19 0319  GLUCAP 165* 117* 103* 193* 108*   Critical care time: 70 min   The patient is critically ill with multiple organ systems failure and requires high complexity decision making for assessment and support, frequent evaluation and titration of therapies, application of advanced monitoring technologies and extensive interpretation of multiple databases.   Critical Care Time devoted to patient care services described in this note is 70  Minutes. This time reflects time of care of this signee Dr. Rodman Pickle.   Rodman Pickle, M.D. [facility] Pulmonary/Critical Care Medicine 03/05/2019 7:26 AM   Please see Amion for pager number to reach on-call Pulmonary and Critical Care Team.

## 2019-03-05 NOTE — Progress Notes (Signed)
PCCM Interval Progress Note  Discussed patient transfer for liver transplant evaluation with Dr. Edison Pace. Patient records have been sent and insurance approval pending. Will likely remain at [facility] overnight. Will continue to provide supportive care and plan for SBT in am. Family updated on current transfer status.   Rodman Pickle, M.D. [facility] Pulmonary/Critical Care Medicine 03/05/2019 5:31 PM

## 2019-03-05 NOTE — Progress Notes (Signed)
Orogastric tube placed per orders with coffee round drainage in clamped tube. Dr Loanne Drilling informed

## 2019-03-05 NOTE — Progress Notes (Signed)
Nutrition Follow-up  INTERVENTION:    Vital AF 1.2 at 60 ml/h (1440 ml per day)   Pro-stat 30 ml BID   Provides 1928 kcal, 138 gm protein, 1168 ml free water daily  NUTRITION DIAGNOSIS:  Inadequate oral intake related to inability to eat as evidenced by NPO status.  Ongoing  GOAL: Provide needs based on ASPEN/SCCM guidelines   Progressing  MONITOR:  Vent status, I & O's, Skin, Labs, Weight trends, TF tolerance   REASON FOR ASSESSMENT:   Consult Enteral/tube feeding initiation and management  ASSESSMENT:    53 yo male admitted with abdominal pain with N/V, shock, hematemesis. PMH includes hepatic cirrhosis, hepatitis B, hepatitis C, COPD, CAD, substance abuse. Recent admission for acute hepatitis and decompensated hepatic cirrhosis. Discharged on 12/26, readmitted 12/27.  Possible transfer to [facility] for liver transplant.  Skin remains jaundiced. Currently receiving CRRT.  Received MD Consult for TF initiation and management. OG tube in place. Vital High Protein has been started, currently infusing at 20 ml/h.   Patient remains intubated on ventilator support, currently on 2 pressors.  MV: 9.2 L/min Temp (24hrs), Avg:98.1 F (36.7 C), Min:96.3 F (35.7 C), Max:99.3 F (37.4 C)   Labs reviewed. AST 6497 (H), ALT 1784 (H), T bili 27.1 (H) CBG's: 112-111  Medications reviewed and include lactulose, levophed, octreotide, vasopressin.   Diet Order:   Diet Order            Diet NPO time specified  Diet effective now              EDUCATION NEEDS:  Not appropriate for education at this time   Skin:  Skin Assessment: Reviewed RN Assessment(jaundiced and MASD to buttocks)  Last BM:  12/29 type 6   Height:   Ht Readings from Last 1 Encounters:  02/09/2019 5' 8"  (1.727 m)    Weight:   Wt Readings from Last 1 Encounters:  03/05/19 85.6 kg    Ideal Body Weight:  70 kg  BMI:  Body mass index is 28.69 kg/m.  Estimated Nutritional Needs:   Kcal:   1935  Protein:  130-145 gm  Fluid:  >/= 1.9 L   Molli Barrows, RD, LDN, [place] Pager [phone] After Hours Pager [phone]

## 2019-03-05 NOTE — Progress Notes (Addendum)
[facility] Gastroenterology Progress Note  Glenn Alvarado 53 y.o. 20-Feb-1966  CC: Decompensated cirrhosis, GI bleed   Subjective: Patient seen and examined at bedside.  Remains intubated.  Discussed with RN.  Small amount of coffee-ground material noted in OG tube.  No black tarry stool today.  ROS : Not able to obtain.   Objective: Vital signs in last 24 hours: Vitals:   03/05/19 0845 03/05/19 0900  BP: (!) 105/58 114/70  Pulse: 82 81  Resp: 15 16  Temp: 98.4 F (36.9 C) 98.4 F (36.9 C)  SpO2: 100% 100%    Physical Exam:  General:   Remains intubated and sedated.  Head:  Normocephalic, without obvious abnormality, atraumatic   Deep jaundice noted.  Lungs:    Good air entry bilaterally.  Heart:  Regular rate and rhythm, S1, S2 normal  Abdomen:    Abdomen is distended, umbilical hernia noted, bowel sounds present, not able to appreciate tenderness.,   Extremities: Extremities normal, atraumatic, trace  edema       Lab Results: Recent Labs    02/28/2019 1944 02/08/2019 2300 03/05/19 0454  NA 135 134* 135  K 4.9 4.7 4.6  CL 107 106 105  CO2 15* 20* 23  GLUCOSE 139* 187* 107*  BUN 29* 26* 23*  CREATININE 1.61* 1.76* 1.71*  CALCIUM 7.6* 7.4* 7.6*  MG  --   --  2.3  PHOS 5.5*  --  4.7*   Recent Labs    02/07/2019 0910 02/22/2019 1944 03/05/19 0454  AST 1,437*  --  6,497*  ALT 575*  --  1,784*  ALKPHOS 54  --  69  BILITOT 21.2*  --  27.1*  PROT 5.6*  --  5.9*  ALBUMIN 3.0* 3.0* 2.8*   Recent Labs    02/25/2019 0916 02/26/2019 0910 02/07/2019 1431 02/19/2019 2300 03/05/19 0454  WBC 8.1 14.9*   < > 17.3* 15.5*  NEUTROABS 5.0 11.9*  --   --   --   HGB 13.5 8.9*  --  9.2* 9.0*  HCT 38.2* 26.7*  --  27.5* 26.9*  MCV 85.8 92.4   < > 92.3 92.1  PLT 164 PLATELET CLUMPS NOTED ON SMEAR, COUNT APPEARS DECREASED   < > 121* 101*   < > = values in this interval not displayed.   Recent Labs    03/05/2019 2022 03/05/19 0454  LABPROT 20.3* 32.8*  INR 1.7* 3.2*       Assessment/Plan: -Decompensated cirrhosis with recent acute hepatitis A infection.  History of hepatitis B and C infection with previous treatment.  Negative HBV DNA and HCV PCR earlier this month. MELD 36  -Acute hepatitis.  AST around 6000, ALT around 1700 and T bili 27.  Could be combination of acute hepatitis A, decompensated cirrhosis as well as shock liver given recent hemorrhagic shock. -Acute GI bleed.  EGD twice showed large blood clot in the fundus.  No evidence of esophageal varices. -Acute kidney injury.  On CRRT -Circulatory shock.  On pressor support  Recommendations -------------------------- -Start IV vitamin K. -Continue other supportive care. -Monitor CBC, CMP and INR. -Prognosis remains poor. -GI will follow  D/W Dr. Edison Pace at [facility]. They will review records for possible transfer to [facility] for liver transplant evaluation.   Approximately 35 minutes spent in the patient's care today with more than 50% of time spent in coordination of care.  Case discussed with Dr. Penelope Coop.  Discussed with [facility] transfer center.  Otis Brace MD, FACP 03/05/2019, 9:57 AM  Contact #  [phone]

## 2019-03-05 NOTE — Progress Notes (Signed)
[name] Progress Note Patient Name: Glenn Alvarado DOB: 1965/10/20 MRN: [id number]   Date of Service  03/05/2019  HPI/Events of Note  Pt now more wakeful and intermittently very agitated.  eICU Interventions  Will put Pt on a 0.2 mcg fixed Precedex infusion in order to avoid Benzodiazepines         Itamar Mcgowan U Shaunie Boehm 03/05/2019, 10:45 PM

## 2019-03-05 NOTE — Procedures (Signed)
Admit: 02/04/2019 LOS: 2  55M UGIB, decompensated cirrhosis, recent HAV infection, VDRF. CKD3 and severe hyperkalemia  Current CRRT Prescription: Start Date: 02/15/2019 Catheter: R IJ Temp HD Cath by CCM BFR: 250 Pre Blood Pump: 1000 4K DFR: 1500 4K Replacement Rate: 300 0K Goal UF: even to positive Anticoagulation: none Clotting: none  S: Worsening LFTs, INR 3.2  Remains on VP, off NE K 4.6, P 4.7 Hb stable  O: 12/29 0701 - 12/30 0700 In: 6500.9 [I.V.:5821.9; IV LFYBOFBPZ:025] Out: 5294 [Urine:360]  Filed Weights   02/16/2019 1043 02/23/2019 0417 03/05/19 0415  Weight: 80.3 kg 80.5 kg 85.6 kg    Recent Labs  Lab 02/08/2019 1944 02/19/2019 2300 03/05/19 0454 03/05/19 0933  NA 135 134* 135  --   K 4.9 4.7 4.6  --   CL 107 106 105  --   CO2 15* 20* 23  --   GLUCOSE 139* 187* 107*  --   BUN 29* 26* 23*  --   CREATININE 1.61* 1.76* 1.71*  --   CALCIUM 7.6* 7.4* 7.6*  --   PHOS 5.5*  --  4.7* 3.8   Recent Labs  Lab 03/01/19 0439 02/20/2019 0916 02/18/2019 0910 02/22/2019 1944 02/20/2019 2300 03/05/19 0454  WBC 6.0 8.1 14.9* 20.3* 17.3* 15.5*  NEUTROABS 3.9 5.0 11.9*  --   --   --   HGB 14.2 13.5 8.9* 9.5* 9.2* 9.0*  HCT 39.1 38.2* 26.7* 28.4* 27.5* 26.9*  MCV 84.3 85.8 92.4 92.8 92.3 92.1  PLT 119* 164 PLATELET CLUMPS NOTED ON SMEAR, COUNT APPEARS DECREASED 141* 121* 101*    Scheduled Meds: . chlorhexidine gluconate (MEDLINE KIT)  15 mL Mouth Rinse BID  . Chlorhexidine Gluconate Cloth  6 each Topical Daily  . feeding supplement (PRO-STAT SUGAR FREE 64)  30 mL Per Tube BID  . feeding supplement (VITAL HIGH PROTEIN)  1,000 mL Per Tube Q24H  . ipratropium-albuterol  3 mL Nebulization Q6H  . lactulose  30 g Per Tube TID  . mouth rinse  15 mL Mouth Rinse 10 times per day  . rifaximin  550 mg Per Tube BID  . sodium chloride flush  10-40 mL Intracatheter Q12H  . sodium chloride flush  3 mL Intravenous Once   Continuous Infusions: .  prismasol BGK 4/2.5 1,000 mL/hr at  03/05/19 1209  .  prismasol BGK 4/2.5 300 mL/hr at 03/05/19 0929  . sodium chloride Stopped (03/05/19 1159)  . cefTRIAXone (ROCEPHIN)  IV Stopped (03/05/19 0933)  . dextrose 125 mL/hr at 03/05/19 1118  . fentaNYL infusion INTRAVENOUS Stopped (03/05/19 0753)  . norepinephrine (LEVOPHED) Adult infusion Stopped (03/05/19 0647)  . octreotide  (SANDOSTATIN)    IV infusion 50 mcg/hr (03/05/19 1200)  . pantoprozole (PROTONIX) infusion 8 mg/hr (03/05/19 1200)  . phenylephrine (NEO-SYNEPHRINE) Adult infusion Stopped (02/28/2019 1359)  . prismasol BGK 4/2.5 1,500 mL/hr at 03/05/19 1104  . vasopressin (PITRESSIN) infusion - *FOR SHOCK* 0.03 Units/min (03/05/19 1200)   PRN Meds:.albuterol, diphenhydrAMINE, fentaNYL, heparin, heparin, ondansetron **OR** ondansetron (ZOFRAN) IV, sodium chloride, sodium chloride flush  ABG    Component Value Date/Time   PHART 7.323 (L) 02/19/2019 2318   PCO2ART 33.4 02/28/2019 2318   PO2ART 345.0 (H) 02/16/2019 2318   HCO3 18.1 (L) 03/05/2019 1807   TCO2 19 (L) 02/11/2019 1807   ACIDBASEDEF 9.0 (H) 03/06/2019 1807   O2SAT 67.0 02/15/2019 1807    A/P  1. Severe hyperkalemia, resolved with CRRT 2. UGIB, s/p EGDs, GI following 3. Cirrhosis, thoguht ETOH  and HCV/HBV related 4. Recent HAV infection 5. VDRF 6. Shock on VP, off NE 7. ABLA 8. Metabolic Acidosis, improved 9. CKD3 BL SCr 1.5-2.0   Cont CRRT, move to all 4K fluids.  Start UF 0 to 62m/h net negative.    RPearson Grippe MD [facility] pgr [phone]

## 2019-03-06 DIAGNOSIS — R579 Shock, unspecified: Secondary | ICD-10-CM

## 2019-03-06 DIAGNOSIS — K7201 Acute and subacute hepatic failure with coma: Secondary | ICD-10-CM

## 2019-03-06 DIAGNOSIS — Z9911 Dependence on respirator [ventilator] status: Secondary | ICD-10-CM

## 2019-03-06 DIAGNOSIS — K819 Cholecystitis, unspecified: Secondary | ICD-10-CM

## 2019-03-06 LAB — COMPREHENSIVE METABOLIC PANEL
ALT: 1998 U/L — ABNORMAL HIGH (ref 0–44)
AST: 5143 U/L — ABNORMAL HIGH (ref 15–41)
Albumin: 2.7 g/dL — ABNORMAL LOW (ref 3.5–5.0)
Alkaline Phosphatase: 78 U/L (ref 38–126)
Anion gap: 8 (ref 5–15)
BUN: 24 mg/dL — ABNORMAL HIGH (ref 6–20)
CO2: 25 mmol/L (ref 22–32)
Calcium: 7.9 mg/dL — ABNORMAL LOW (ref 8.9–10.3)
Chloride: 103 mmol/L (ref 98–111)
Creatinine, Ser: 1.54 mg/dL — ABNORMAL HIGH (ref 0.61–1.24)
GFR calc Af Amer: 59 mL/min — ABNORMAL LOW (ref >60)
GFR calc non Af Amer: 51 mL/min — ABNORMAL LOW (ref >60)
Glucose, Bld: 122 mg/dL — ABNORMAL HIGH (ref 70–99)
Potassium: 4.2 mmol/L (ref 3.5–5.1)
Sodium: 136 mmol/L (ref 135–145)
Total Bilirubin: 27 mg/dL (ref 0.3–1.2)
Total Protein: 6 g/dL — ABNORMAL LOW (ref 6.5–8.1)

## 2019-03-06 LAB — GLUCOSE, CAPILLARY
Glucose-Capillary: 113 mg/dL — ABNORMAL HIGH (ref 70–99)
Glucose-Capillary: 117 mg/dL — ABNORMAL HIGH (ref 70–99)
Glucose-Capillary: 78 mg/dL (ref 70–99)
Glucose-Capillary: 95 mg/dL (ref 70–99)
Glucose-Capillary: 95 mg/dL (ref 70–99)

## 2019-03-06 LAB — PROTIME-INR
INR: 2.2 — ABNORMAL HIGH (ref 0.8–1.2)
Prothrombin Time: 24 seconds — ABNORMAL HIGH (ref 11.4–15.2)

## 2019-03-06 LAB — CBC
HCT: 26.1 % — ABNORMAL LOW (ref 39.0–52.0)
Hemoglobin: 8.7 g/dL — ABNORMAL LOW (ref 13.0–17.0)
MCH: 31.1 pg (ref 26.0–34.0)
MCHC: 33.3 g/dL (ref 30.0–36.0)
MCV: 93.2 fL (ref 80.0–100.0)
Platelets: 70 10*3/uL — ABNORMAL LOW (ref 150–400)
RBC: 2.8 MIL/uL — ABNORMAL LOW (ref 4.22–5.81)
RDW: 20.6 % — ABNORMAL HIGH (ref 11.5–15.5)
WBC: 7.7 10*3/uL (ref 4.0–10.5)
nRBC: 0 % (ref 0.0–0.2)

## 2019-03-06 LAB — RENAL FUNCTION PANEL
Albumin: 2.6 g/dL — ABNORMAL LOW (ref 3.5–5.0)
Anion gap: 6 (ref 5–15)
BUN: 24 mg/dL — ABNORMAL HIGH (ref 6–20)
CO2: 26 mmol/L (ref 22–32)
Calcium: 7.9 mg/dL — ABNORMAL LOW (ref 8.9–10.3)
Chloride: 106 mmol/L (ref 98–111)
Creatinine, Ser: 1.4 mg/dL — ABNORMAL HIGH (ref 0.61–1.24)
GFR calc Af Amer: >60 mL/min (ref >60)
GFR calc non Af Amer: 57 mL/min — ABNORMAL LOW (ref >60)
Glucose, Bld: 107 mg/dL — ABNORMAL HIGH (ref 70–99)
Phosphorus: 3 mg/dL (ref 2.5–4.6)
Potassium: 4.1 mmol/L (ref 3.5–5.1)
Sodium: 138 mmol/L (ref 135–145)

## 2019-03-06 LAB — PHOSPHORUS: Phosphorus: 3.5 mg/dL (ref 2.5–4.6)

## 2019-03-06 LAB — MAGNESIUM
Magnesium: 2.4 mg/dL (ref 1.7–2.4)
Magnesium: 2.5 mg/dL — ABNORMAL HIGH (ref 1.7–2.4)

## 2019-03-06 MED ORDER — ORAL CARE MOUTH RINSE
15.0000 mL | Freq: Two times a day (BID) | OROMUCOSAL | Status: DC
  Administered 2019-03-06: 15 mL via OROMUCOSAL

## 2019-03-06 MED ORDER — PIPERACILLIN-TAZOBACTAM 3.375 G IVPB 30 MIN
3.3750 g | Freq: Four times a day (QID) | INTRAVENOUS | Status: DC
  Administered 2019-03-06 – 2019-03-08 (×8): 3.375 g via INTRAVENOUS
  Filled 2019-03-06 (×10): qty 50

## 2019-03-06 MED ORDER — HYDROMORPHONE HCL 1 MG/ML IJ SOLN
0.5000 mg | INTRAMUSCULAR | Status: DC | PRN
  Administered 2019-03-06 (×2): 0.5 mg via INTRAVENOUS
  Filled 2019-03-06 (×2): qty 0.5

## 2019-03-06 MED ORDER — PANTOPRAZOLE SODIUM 40 MG IV SOLR
40.0000 mg | Freq: Two times a day (BID) | INTRAVENOUS | Status: DC
  Administered 2019-03-07 – 2019-03-13 (×14): 40 mg via INTRAVENOUS
  Filled 2019-03-06 (×14): qty 40

## 2019-03-06 MED ORDER — SODIUM CHLORIDE 0.9 % IV SOLN: 1.0000 g | INTRAVENOUS | Status: DC

## 2019-03-06 MED ORDER — RIFAXIMIN 550 MG PO TABS
550.0000 mg | ORAL_TABLET | Freq: Two times a day (BID) | ORAL | Status: DC
  Administered 2019-03-06 – 2019-03-13 (×15): 550 mg via ORAL
  Filled 2019-03-06 (×17): qty 1

## 2019-03-06 MED ORDER — HYDROMORPHONE HCL 1 MG/ML IJ SOLN
0.5000 mg | INTRAMUSCULAR | Status: DC | PRN
  Administered 2019-03-06 – 2019-03-07 (×6): 0.5 mg via INTRAVENOUS
  Filled 2019-03-06 (×6): qty 0.5

## 2019-03-06 MED ORDER — VITAMIN K1 10 MG/ML IJ SOLN
10.0000 mg | Freq: Once | INTRAVENOUS | Status: AC
  Administered 2019-03-06: 10 mg via INTRAVENOUS
  Filled 2019-03-06: qty 1

## 2019-03-06 MED ORDER — LACTULOSE 10 GM/15ML PO SOLN
30.0000 g | Freq: Three times a day (TID) | ORAL | Status: DC
  Administered 2019-03-06 – 2019-03-11 (×12): 30 g via ORAL
  Administered 2019-03-12: 23:00:00 10 g via ORAL
  Administered 2019-03-13 (×2): 30 g via ORAL
  Filled 2019-03-06 (×21): qty 45

## 2019-03-06 NOTE — Progress Notes (Addendum)
[facility] Gastroenterology Progress Note  Glenn Alvarado 53 y.o. 29-Apr-1965  CC: Decompensated cirrhosis, GI bleed   Subjective: Patient seen and examined at bedside.  Remains intubated.  Somewhat agitated  discussed with RN at bedside.  No further GI bleed.  ROS : Not able to obtain.   Objective: Vital signs in last 24 hours: Vitals:   03/06/19 0800 03/06/19 0824  BP: (!) 113/54 (!) 102/52  Pulse: 86 83  Resp: 17 12  Temp: 97.9 F (36.6 C) 98.1 F (36.7 C)  SpO2: 98% 97%    Physical Exam:  General:   Remains intubated and sedated.  Head:  Normocephalic, without obvious abnormality, atraumatic   Deep jaundice noted.  Lungs:    Good air entry bilaterally.  Heart:  Regular rate and rhythm, S1, S2 normal  Abdomen:    Abdomen is distended, umbilical hernia noted, bowel sounds present, not able to appreciate tenderness.,   Extremities: Extremities normal, atraumatic, trace  edema       Lab Results: Recent Labs    03/05/19 1543 03/06/19 0400  NA 134* 136  K 4.4 4.2  CL 103 103  CO2 25 25  GLUCOSE 104* 122*  BUN 22* 24*  CREATININE 1.72* 1.54*  CALCIUM 7.6* 7.9*  MG 2.2 2.4  PHOS 3.6 3.5   Recent Labs    03/05/19 0454 03/05/19 1543 03/06/19 0400  AST 6,497*  --  5,143*  ALT 1,784*  --  1,998*  ALKPHOS 69  --  78  BILITOT 27.1*  --  27.0*  PROT 5.9*  --  6.0*  ALBUMIN 2.8* 2.7* 2.7*   Recent Labs    03/01/2019 0916 03/01/2019 0910 02/23/2019 1431 03/05/19 1543 03/06/19 0400  WBC 8.1 14.9*   < > 12.5* 7.7  NEUTROABS 5.0 11.9*  --   --   --   HGB 13.5 8.9*  --  8.8* 8.7*  HCT 38.2* 26.7*  --  26.0* 26.1*  MCV 85.8 92.4   < > 92.5 93.2  PLT 164 PLATELET CLUMPS NOTED ON SMEAR, COUNT APPEARS DECREASED   < > 85* 70*   < > = values in this interval not displayed.   Recent Labs    03/05/19 0454 03/06/19 0400  LABPROT 32.8* 24.0*  INR 3.2* 2.2*      Assessment/Plan: -Decompensated cirrhosis with recent acute hepatitis A infection.  History of hepatitis  B and C infection with previous treatment.  Negative HBV DNA and HCV PCR earlier this month. MELD 36  As of 03/05/2019  -Acute hepatitis.  AST around 6000, ALT around 1700 and T bili 27.  Could be combination of acute hepatitis A, decompensated cirrhosis as well as shock liver given recent hemorrhagic shock. -Gallbladder distention.  Initially seen on CT scan February 25, 2019. -Acute GI bleed.  EGD twice showed large blood clot in the fundus.  No evidence of esophageal varices. -Acute kidney injury.  On CRRT -Circulatory shock.  On pressor support  Recommendations -------------------------- -INR improved with vitamin K.  LFTs stable remained significantly elevated.  Hemoglobin stable compared to yesterday. -Recommend repeat ultrasound abdomen right upper quadrant tomorrow for follow-up on gallbladder distention and gallbladder wall thickening.  Hold off on IR guided drain for today.  He has normal white count.  Afebrile.  Okay to extubate from GI standpoint. D/W Dr. Carlis Abbott.  -Continue Zosyn -Continue supportive care. -Awaiting transfer to [facility].  Otis Brace MD, [place] 03/06/2019, 8:58 AM  Contact #  [phone]

## 2019-03-06 NOTE — Progress Notes (Signed)
[place] Progress Note Patient Name: Graysin Luczynski DOB: [birth year] MRN: [id number]   Date of Service  03/06/2019  HPI/Events of Note  Pain   eICU Interventions  Will order: 1. Increase Dilaudid 0.5 mg IV to Q 2 hours PRN pain.      Intervention Category Major Interventions: Other:  Aadil Sur [name] 03/06/2019, 8:09 PM

## 2019-03-06 NOTE — Progress Notes (Signed)
NAME:  Glenn Alvarado, MRN:  [id number], DOB:  Nov 13, 1965, LOS: 3 ADMISSION DATE:  02/13/2019, CONSULTATION DATE:  12/28 REFERRING MD:  Dr. Doristine Bosworth, CHIEF COMPLAINT:  Shock, hematemesis    Brief History   53 year old male with history of hepatitis related hepatic cirrhosis.  Admitted 12/27 with nausea vomiting and abdominal pain.  In the p.m. hours of 12/28 he developed a large volume hematemesis and circulatory shock.  History of present illness   Patient is encephalopathic and/or intubated. Therefore history has been obtained from chart review.  53 year old male with past medical history as below, which is significant for hepatitis B, C, hepatic cirrhosis, COPD, CAD, and substance abuse.  He was recently admitted to [facility] for acute hepatitis A.  Course was complicated by hepatic encephalopathy and he was discharged home on lactulose on 12/26.  He was then readmitted on 12/27 after family members noted him to be confused with multiple episodes of vomiting.  In the emergency department he was complaining of right lower quadrant abdominal pain.  He was admitted to the hospitalist service for treatment of hepatic encephalopathy.  He was started on Xifaxan and lactulose.  Then in the p.m. hours of 12/28 he continued to complain of abdominal pain and developed large-volume hematemesis.  As result his blood pressure fell to systolics in the 25K.  Following resuscitation was initiated he was transferred to ICU for further evaluation.   Past Medical History   has a past medical history of Arthritis, Bleeds easily ([place]), CAD (coronary artery disease), Chronic back pain, COPD (chronic obstructive pulmonary disease) ([place]), Daily headache, Depression, Depression with anxiety, Essential hypertension, GERD (gastroesophageal reflux disease), Hepatitis B, Hepatitis C, HLD (hyperlipidemia), Myocardial infarction ([place]), Pneumonia, Substance abuse ([place]), and Tobacco abuse.   Significant Hospital Events     12/22 > 12/16 admit for acute hepatitis A 12/27 > admit for hepatic encephalopathy and abdominal pain 12/28 > upper GI bleeding, shock, ICU transfer. EGD unable to visualize source of bleed 12/29 > Central line and vas cath placed for hyperkalemia  Consults:  GI 12/28   Procedures:  EGD 12/28 unable to visualize source of bleed due to blood clots. No esophageal varices or ulcerations.   Significant Diagnostic Tests:  12/28 CT abdomen > Bilateral nonobstructive nephrolithiasis. No hydronephrosis or renal obstruction is noted. Findings consistent with hepatic cirrhosis. Severe gallbladder distension is noted with large gallstone in the neck of the gallbladder. Enlarged collateral veins are seen in the left upper quadrant consistent with portal hypertension.  Micro Data:  SARS COV 2 12/17 > negative BCX 12/29 > Antimicrobials:  CTX 12/28 >  Interim history/subjective:  Remains on vasopressin. Required increased sedation overnight due to agitation. Precedex turned off this morning.  Objective   Blood pressure (!) 102/52, pulse 83, temperature 98.1 F (36.7 C), resp. rate 12, height 5' 8"  (1.727 m), weight 83.4 kg, SpO2 97 %.    Vent Mode: PSV;CPAP FiO2 (%):  [30 %] 30 % Set Rate:  [18 bmp] 18 bmp Vt Set:  [540 mL] 540 mL PEEP:  [5 cmH20] 5 cmH20 Pressure Support:  [5 cmH20] 5 cmH20 Plateau Pressure:  [16 cmH20-21 cmH20] 21 cmH20   Intake/Output Summary (Last 24 hours) at 03/06/2019 0834 Last data filed at 03/06/2019 0800 Gross per 24 hour  Intake 3972.39 ml  Output 5127 ml  Net -1154.61 ml   Filed Weights   03/05/2019 0417 03/05/19 0415 03/06/19 0600  Weight: 80.5 kg 85.6 kg 83.4 kg  Physical Exam: General: chronically-ill appearing, sleeping comfortably but easily arousable HENT: [place]/AT, oral mucosa moist Eyes: EOMI, icteric sclera Respiratory: breathing comfortably on 5/5, no tachypnea. Cardiovascular: RRR, no murmurs GI: mildly distended, tympanitic, active BS.  Umbilical hernia Extremities: no c/c/e Neuro: RASS -1, tracks, answers y/n questions. Very weak, but following commands. Skin: jaundiced GU: foley with minimal [facility] Problem list     Assessment & Plan:   53 year old male with cirrhosis secondary ASH/NASH/hepatitis, history of hepatitis A/B/C with recent admission for decompensated cirrhosis related to acute hepatitis A. History of alcohol abuse (reportedly quit 15 years ago), COPD, and polysubstance abuse who was admitted for hepatic encephalopathy. On 12/29 he reported abdominal pain and had hematemesis requiring transfer to the ICU. MELD score 26 and MDF score 35 on admission. Doubt this is alcoholic hepatitis based on extremely elevated LFTs and absence of recent alcohol history.  Hemorrhagic shock secondary to upper GI bleed - unclear source. Improved hemodynamics. S/p PRBC x 2. EGD 12/28 unable to visualize source of bleed due to blood clots. No esophageal varices or ulcerations. -vasopressors as required to maintain MAP >65 -transfuse for Hb <7 or bleeding with hemodynamic instability -Continue protonix infusion today for day 3/3, then PPI BID tomorrow -Octreotide gtt x 72 hours -Decreased CBC to daily -Daily INR/PT -Continue Ceftriaxone- decreased to 1g daily for prophylaxis -appreciate GI's assistance ([facility])  Acute liver failure Acute hepatic encephalopathy Cirrhosis secondary alcoholic steatohepatitis/NASH/hepatitis Recent hepatitis A infection Coagulopathy Recently treated for acute hepatitis A. Per [facility] outpatient notes, his candidacy for liver transplant barriers would include active smoking (2 cigarettes/day). -Called [facility] transfer center this morning. The financial office is still reviewing his candidacy and will let us know. There seems to have been confusion from the transplant office as to his referral as an ICU patient rather than as an outpatient, so the PCCM contact information was  updated with them. -Daily CMP, PT/INR -RUQ Abdominal US -Resume lactulose when enteral access is obtained -Vitamin K today for elevated INR  Acute cholecystitis on [place] -change antibiotics to pip-tazo; appreciate pharmacy's assistance -Will discuss with GI, but likely needs a perc bili drain per IR  Acute hypoxemic respiratory failure secondary to critical illness, airway protection -LTVV, 6-8cc/kg IBW; goala plateau <30 and driving pressure <31 -daily SAT& SBT- doing well on both currently, but weak -VAP prevention protocol -precedex, fentanyl PRN for sedation- goal RASS 0 to -1  AKI with metabolic acidosis Hyperkalemia - resolved Unable to administer rectally or orally. May need to dialysis to electrolyte abnormalities -appreciate nephrology's assistance  Hypoglycemia - resolved on TF -con't to monitor  COPD -schdeuled nebs  Best practice:  Diet: NPO, small  Pain/Anxiety/Delirium protocol (if indicated): PAD protocol VAP protocol (if indicated): yes DVT prophylaxis: SCDs GI prophylaxis: PPI gtt Glucose control: SSI PRN Mobility: BR Code Status: FULL Family Communication: will update family today Disposition: ICU  Labs   CBC: Recent Labs  Lab 02/28/19 0417 03/01/19 0439 02/24/2019 0916 03/05/2019 0910 02/15/2019 1944 02/27/2019 2300 03/05/19 0454 03/05/19 1543 03/06/19 0400  WBC 3.9* 6.0 8.1 14.9* 20.3* 17.3* 15.5* 12.5* 7.7  NEUTROABS 2.6 3.9 5.0 11.9*  --   --   --   --   --   HGB 12.3* 14.2 13.5 8.9* 9.5* 9.2* 9.0* 8.8* 8.7*  HCT 34.1* 39.1 38.2* 26.7* 28.4* 27.5* 26.9* 26.0* 26.1*  MCV 84.4 84.3 85.8 92.4 92.8 92.3 92.1 92.5 93.2  PLT 100* 119* 164 PLATELET CLUMPS NOTED  ON SMEAR, COUNT APPEARS DECREASED 141* 121* 101* 85* 70*    Basic Metabolic Panel: Recent Labs  Lab 03/01/2019 1944 02/14/2019 2300 03/05/19 0454 03/05/19 0933 03/05/19 1202 03/05/19 1543 03/06/19 0400  NA 135 134* 135  --   --  134* 136  K 4.9 4.7 4.6  --  4.3 4.4 4.2  CL 107 106 105   --   --  103 103  CO2 15* 20* 23  --   --  25 25  GLUCOSE 139* 187* 107*  --   --  104* 122*  BUN 29* 26* 23*  --   --  22* 24*  CREATININE 1.61* 1.76* 1.71*  --   --  1.72* 1.54*  CALCIUM 7.6* 7.4* 7.6*  --   --  7.6* 7.9*  MG  --   --  2.3 2.2  --  2.2 2.4  PHOS 5.5*  --  4.7* 3.8  --  3.6 3.5   GFR: Estimated Creatinine Clearance: 59 mL/min (A) (by C-G formula based on SCr of 1.54 mg/dL (H)). Recent Labs  Lab 03/01/2019 2025 02/16/2019 0124 02/28/2019 2300 03/05/19 0454 03/05/19 1543 03/06/19 0400  WBC  --  11.9* 17.3* 15.5* 12.5* 7.7  LATICACIDVEN 5.2* 4.3*  --   --   --   --     Liver Function Tests: Recent Labs  Lab 02/14/2019 0916 02/16/2019 0354 02/27/2019 0910 02/20/2019 1944 03/05/19 0454 03/05/19 1543 03/06/19 0400  AST 371* 1,075* 1,437*  --  6,497*  --  5,143*  ALT 369* 464* 575*  --  1,784*  --  1,998*  ALKPHOS 92 63 54  --  69  --  78  BILITOT 25.3* 22.7* 21.2*  --  27.1*  --  27.0*  PROT 7.8 6.2* 5.6*  --  5.9*  --  6.0*  ALBUMIN 2.5* 2.9* 3.0* 3.0* 2.8* 2.7* 2.7*   Recent Labs  Lab 02/07/2019 1624 03/05/2019 0916  LIPASE 26 32   Recent Labs  Lab 02/20/2019 2124 03/06/2019 0916 02/28/2019 0354 02/14/2019 2311  AMMONIA 43* 61* 97* 101*    ABG    Component Value Date/Time   PHART 7.323 (L) 02/17/2019 2318   PCO2ART 33.4 03/05/2019 2318   PO2ART 345.0 (H) 02/10/2019 2318   HCO3 18.1 (L) 02/19/2019 1807   TCO2 19 (L) 02/16/2019 1807   ACIDBASEDEF 9.0 (H) 02/11/2019 1807   O2SAT 67.0 02/19/2019 1807     Coagulation Profile: Recent Labs  Lab 02/07/2019 1755 02/08/2019 2022 03/05/19 0454 03/06/19 0400  INR 1.4* 1.7* 3.2* 2.2*    Cardiac Enzymes: No results for input(s): CKTOTAL, CKMB, CKMBINDEX, TROPONINI in the last 168 hours.  HbA1C: Hgb A1c MFr Bld  Date/Time Value Ref Range Status  07/23/2017 06:21 AM 4.4 (L) 4.8 - 5.6 % Final    Comment:    (NOTE) Pre diabetes:          5.7%-6.4% Diabetes:              >6.4% Glycemic control for   <7.0% adults  with diabetes     CBG: Recent Labs  Lab 03/05/19 1125 03/05/19 1517 03/05/19 1951 03/05/19 2331 03/06/19 0756  GLUCAP 111* 89 87 97 113*   Critical care time: 70 min   This patient is critically ill with multiple organ system failure which requires frequent high complexity decision making, assessment, support, evaluation, and titration of therapies. This was completed through the application of advanced monitoring technologies and extensive interpretation of multiple databases.  During this encounter critical care time was devoted to patient care services described in this note for 50 minutes.  Julian Hy, DO 03/06/19 9:09 AM [facility]

## 2019-03-06 NOTE — Progress Notes (Signed)
[facility] Progress Note    Assessment/ Plan:   1. Severe hyperkalemia, resolved with CRRT--> on all 4K fluids now, will try to increase net neg to 50-100 mL/ hr 2. UGIB, s/p EGDs, GI following 3. Cirrhosis, ETOH and HCV/HBV related--> ? Transfer to [facility]  4. Recent HAV infection 5. VDRF 6. Shock on VP, off NE 7. ABLA 8. Metabolic Acidosis, improved 9. CKD3 BL SCr 1.5-2.0  10. Possible cholecystitis- GI following as above  Subjective:    Agitated today, plan for extubation.  On CRRT   Objective:   BP 111/62   Pulse 82   Temp (!) 97.3 F (36.3 C)   Resp 14   Ht _0  (1.727 m)   Wt 83.4 kg   SpO2 99%   BMI 27.96 kg/m   Intake/Output Summary (Last 24 hours) at 03/06/2019 1033 Last data filed at 03/06/2019 1000 Gross per 24 hour  Intake 3876.88 ml  Output 5051 ml  Net -1174.12 ml   Weight change: -2.2 kg  Physical Exam: Gen: intubated, jaundiced CVS: tachycardic Resp: RRR Abd: distended, RUQ tenderness Ext: 1+ LE edema  Imaging: DG Abd Portable 1V  Result Date: 03/05/2019 CLINICAL DATA:  Check gastric catheter placement EXAM: PORTABLE ABDOMEN - 1 VIEW COMPARISON:  None. FINDINGS: Gastric catheter is noted within the distal aspect of the stomach. No significant obstructive changes are seen. IMPRESSION: Gastric catheter in place. Electronically Signed   By: Inez Catalina M.D.   On: 03/05/2019 08:43   US Abdomen Limited RUQ  Result Date: 03/05/2019 CLINICAL DATA:  Dilated gallbladder with cholelithiasis on recent CT examination. EXAM: ULTRASOUND ABDOMEN LIMITED RIGHT UPPER QUADRANT COMPARISON:  02/25/2019 FINDINGS: Gallbladder: Gallbladder distension is again noted similar to that seen on the prior exam. Cholelithiasis is identified. Mild wall thickening is noted to 3.8 mm. No pericholecystic fluid is seen. Common bile duct: Diameter: 8.5 mm. Liver: Lobular margins are noted consistent with underlying cirrhosis. These changes are similar to that seen  on prior CT examination. No focal mass lesion is noted. Mild intrahepatic biliary ductal dilatation is seen. Portal vein is patent on color Doppler imaging with normal direction of blood flow towards the liver. Other: None. IMPRESSION: Significant gallbladder distension with evidence of cholelithiasis and findings suggestive of acute cholecystitis. Hepatic cirrhosis. Electronically Signed   By: Inez Catalina M.D.   On: 03/05/2019 08:42    Labs: BMET Recent Labs  Lab 02/10/2019 0354 02/17/2019 0910 02/24/2019 1431 03/06/2019 1807 02/06/2019 1944 02/12/2019 2300 03/05/19 0454 03/05/19 0933 03/05/19 1202 03/05/19 1543 03/06/19 0400  NA 136 138 138 138 135 134* 135  --   --  134* 136  K 7.3* 6.1* 5.2* 4.9 4.9 4.7 4.6  --  4.3 4.4 4.2  CL 109 112*  --   --  107 106 105  --   --  103 103  CO2 15* 15*  --   --  15* 20* 23  --   --  25 25  GLUCOSE 23* 58*  --   --  139* 187* 107*  --   --  104* 122*  BUN 34* 39*  --   --  29* 26* 23*  --   --  22* 24*  CREATININE 1.99* 2.24*  --   --  1.61* 1.76* 1.71*  --   --  1.72* 1.54*  CALCIUM 7.4* 7.1*  --   --  7.6* 7.4* 7.6*  --   --  7.6* 7.9*  PHOS  --   --   --   --  5.5*  --  4.7* 3.8  --  3.6 3.5   CBC Recent Labs  Lab 02/28/19 0417 03/01/19 0439 02/26/2019 0916 03/01/2019 0910 02/24/2019 1431 02/14/2019 2300 03/05/19 0454 03/05/19 1543 03/06/19 0400  WBC 3.9* 6.0 8.1 14.9*   < > 17.3* 15.5* 12.5* 7.7  NEUTROABS 2.6 3.9 5.0 11.9*  --   --   --   --   --   HGB 12.3* 14.2 13.5 8.9*  --  9.2* 9.0* 8.8* 8.7*  HCT 34.1* 39.1 38.2* 26.7*  --  27.5* 26.9* 26.0* 26.1*  MCV 84.4 84.3 85.8 92.4   < > 92.3 92.1 92.5 93.2  PLT 100* 119* 164 PLATELET CLUMPS NOTED ON SMEAR, COUNT APPEARS DECREASED   < > 121* 101* 85* 70*   < > = values in this interval not displayed.    Medications:    . chlorhexidine gluconate (MEDLINE KIT)  15 mL Mouth Rinse BID  . Chlorhexidine Gluconate Cloth  6 each Topical Daily  . feeding supplement (PRO-STAT SUGAR FREE 64)  30 mL Per  Tube BID  . ipratropium-albuterol  3 mL Nebulization Q6H  . lactulose  30 g Per Tube TID  . mouth rinse  15 mL Mouth Rinse 10 times per day  . [START ON 03/07/2019] pantoprazole (PROTONIX) IV  40 mg Intravenous Q12H  . rifaximin  550 mg Per Tube BID  . sodium chloride flush  10-40 mL Intracatheter Q12H      Madelon Lips, MD [facility] pgr [phone] 03/06/2019, 10:33 AM

## 2019-03-06 NOTE — Plan of Care (Signed)
Spoke to Dr. Lauris Chroman at [place], [place]. He has been tentatively accepted by [facility]U and Hepatology for transfer, but we are awaiting a bed. PCCM pager number and my cell phone number have been provided to their transfer center to facilitate communication.  Julian Hy, DO 03/06/19 12:01 PM [facility]

## 2019-03-06 NOTE — Procedures (Signed)
Extubation Procedure Note  Patient Details:   Name: Elier Zellars DOB: 1965-09-17 MRN: [id number]   Airway Documentation:    Vent end date: (not recorded) Vent end time: (not recorded)   Evaluation  O2 sats: stable throughout Complications: No apparent complications Patient did tolerate procedure well. Bilateral Breath Sounds: Clear, Diminished   Yes   Pt extubated per MD order after weaning on 5PS and 5CPAP for 3hrs. Cuff leak heard prior to procedure. Pt is able to speak well and states that he is in pain. RN and RT at bedside. No apparent complications at this time. BBS are diminished throughout. Vitals are stable, pt is on 6L [place].   Esperanza Sheets T 03/06/2019, 10:24 AM

## 2019-03-07 ENCOUNTER — Inpatient Hospital Stay (HOSPITAL_COMMUNITY): Payer: Medicare HMO

## 2019-03-07 DIAGNOSIS — D689 Coagulation defect, unspecified: Secondary | ICD-10-CM

## 2019-03-07 DIAGNOSIS — K810 Acute cholecystitis: Secondary | ICD-10-CM

## 2019-03-07 DIAGNOSIS — R188 Other ascites: Secondary | ICD-10-CM

## 2019-03-07 LAB — PROTIME-INR
INR: 2 — ABNORMAL HIGH (ref 0.8–1.2)
INR: 1.8 — ABNORMAL HIGH (ref 0.8–1.2)
Prothrombin Time: 22.3 seconds — ABNORMAL HIGH (ref 11.4–15.2)
Prothrombin Time: 20.4 seconds — ABNORMAL HIGH (ref 11.4–15.2)

## 2019-03-07 LAB — RENAL FUNCTION PANEL
Albumin: 2.6 g/dL — ABNORMAL LOW (ref 3.5–5.0)
Anion gap: 13 (ref 5–15)
BUN: 26 mg/dL — ABNORMAL HIGH (ref 6–20)
CO2: 21 mmol/L — ABNORMAL LOW (ref 22–32)
Calcium: 8.3 mg/dL — ABNORMAL LOW (ref 8.9–10.3)
Chloride: 105 mmol/L (ref 98–111)
Creatinine, Ser: 1.62 mg/dL — ABNORMAL HIGH (ref 0.61–1.24)
GFR calc Af Amer: 56 mL/min — ABNORMAL LOW (ref >60)
GFR calc non Af Amer: 48 mL/min — ABNORMAL LOW (ref >60)
Glucose, Bld: 98 mg/dL (ref 70–99)
Phosphorus: 3.1 mg/dL (ref 2.5–4.6)
Potassium: 3.8 mmol/L (ref 3.5–5.1)
Sodium: 139 mmol/L (ref 135–145)

## 2019-03-07 LAB — BPAM RBC
Blood Product Expiration Date: 202101102359
Blood Product Expiration Date: 202101102359
Blood Product Expiration Date: 202101212359
Blood Product Expiration Date: 202101222359
ISSUE DATE / TIME: 202012282034
ISSUE DATE / TIME: 202012282034
Unit Type and Rh: 6200
Unit Type and Rh: 6200
Unit Type and Rh: 9500
Unit Type and Rh: 9500

## 2019-03-07 LAB — CBC
HCT: 26.9 % — ABNORMAL LOW (ref 39.0–52.0)
Hemoglobin: 8.9 g/dL — ABNORMAL LOW (ref 13.0–17.0)
MCH: 30.8 pg (ref 26.0–34.0)
MCHC: 33.1 g/dL (ref 30.0–36.0)
MCV: 93.1 fL (ref 80.0–100.0)
Platelets: 91 10*3/uL — ABNORMAL LOW (ref 150–400)
RBC: 2.89 MIL/uL — ABNORMAL LOW (ref 4.22–5.81)
RDW: 20.5 % — ABNORMAL HIGH (ref 11.5–15.5)
WBC: 9.6 10*3/uL (ref 4.0–10.5)
nRBC: 0 % (ref 0.0–0.2)

## 2019-03-07 LAB — TYPE AND SCREEN
ABO/RH(D): A POS
Antibody Screen: NEGATIVE
Unit division: 0
Unit division: 0
Unit division: 0
Unit division: 0

## 2019-03-07 LAB — COMPREHENSIVE METABOLIC PANEL
ALT: 1509 U/L — ABNORMAL HIGH (ref 0–44)
AST: 3174 U/L — ABNORMAL HIGH (ref 15–41)
Albumin: 2.5 g/dL — ABNORMAL LOW (ref 3.5–5.0)
Alkaline Phosphatase: 79 U/L (ref 38–126)
Anion gap: 8 (ref 5–15)
BUN: 20 mg/dL (ref 6–20)
CO2: 26 mmol/L (ref 22–32)
Calcium: 8 mg/dL — ABNORMAL LOW (ref 8.9–10.3)
Chloride: 104 mmol/L (ref 98–111)
Creatinine, Ser: 1.14 mg/dL (ref 0.61–1.24)
GFR calc Af Amer: >60 mL/min (ref >60)
GFR calc non Af Amer: >60 mL/min (ref >60)
Glucose, Bld: 90 mg/dL (ref 70–99)
Potassium: 4 mmol/L (ref 3.5–5.1)
Sodium: 138 mmol/L (ref 135–145)
Total Bilirubin: 28.2 mg/dL (ref 0.3–1.2)
Total Protein: 6.6 g/dL (ref 6.5–8.1)

## 2019-03-07 LAB — GLUCOSE, CAPILLARY
Glucose-Capillary: 82 mg/dL (ref 70–99)
Glucose-Capillary: 86 mg/dL (ref 70–99)
Glucose-Capillary: 75 mg/dL (ref 70–99)
Glucose-Capillary: 91 mg/dL (ref 70–99)
Glucose-Capillary: 87 mg/dL (ref 70–99)

## 2019-03-07 LAB — MAGNESIUM: Magnesium: 2.6 mg/dL — ABNORMAL HIGH (ref 1.7–2.4)

## 2019-03-07 LAB — PHOSPHORUS: Phosphorus: 3.1 mg/dL (ref 2.5–4.6)

## 2019-03-07 MED ORDER — HYDROMORPHONE HCL 1 MG/ML IJ SOLN
1.0000 mg | INTRAMUSCULAR | Status: DC | PRN
  Administered 2019-03-07 – 2019-03-14 (×13): 1 mg via INTRAVENOUS
  Filled 2019-03-07 (×14): qty 1

## 2019-03-07 MED ORDER — WHITE PETROLATUM EX OINT
TOPICAL_OINTMENT | CUTANEOUS | Status: AC
  Filled 2019-03-07: qty 28.35

## 2019-03-07 MED ORDER — SODIUM CHLORIDE 0.9% IV SOLUTION: Freq: Once | INTRAVENOUS | Status: AC

## 2019-03-07 MED ORDER — PHYTONADIONE 5 MG PO TABS
10.0000 mg | ORAL_TABLET | Freq: Once | ORAL | Status: AC
  Administered 2019-03-07: 10 mg via ORAL
  Filled 2019-03-07: qty 2

## 2019-03-07 MED ORDER — ORAL CARE MOUTH RINSE
15.0000 mL | Freq: Two times a day (BID) | OROMUCOSAL | Status: DC
  Administered 2019-03-07 – 2019-03-13 (×11): 15 mL via OROMUCOSAL

## 2019-03-07 MED ORDER — SODIUM CHLORIDE 0.9 % IV SOLN: INTRAVENOUS | Status: DC | PRN

## 2019-03-07 MED ORDER — WHITE PETROLATUM EX OINT
TOPICAL_OINTMENT | CUTANEOUS | Status: DC | PRN
  Administered 2019-03-07: 0.2 via TOPICAL
  Filled 2019-03-07: qty 30

## 2019-03-07 MED ORDER — DEXMEDETOMIDINE HCL IN NACL 400 MCG/100ML IV SOLN
0.4000 ug/kg/h | INTRAVENOUS | Status: DC
  Administered 2019-03-07: 20:00:00 0.4 ug/kg/h via INTRAVENOUS
  Administered 2019-03-08: 0.7 ug/kg/h via INTRAVENOUS
  Administered 2019-03-08: 15:00:00 0.4 ug/kg/h via INTRAVENOUS
  Administered 2019-03-08: 05:00:00 0.6 ug/kg/h via INTRAVENOUS
  Administered 2019-03-09: 18:00:00 0.5 ug/kg/h via INTRAVENOUS
  Administered 2019-03-09: 12:00:00 0.8 ug/kg/h via INTRAVENOUS
  Administered 2019-03-09: 0.4 ug/kg/h via INTRAVENOUS
  Administered 2019-03-09: 06:00:00 0.8 ug/kg/h via INTRAVENOUS
  Administered 2019-03-10: 0.4 ug/kg/h via INTRAVENOUS
  Filled 2019-03-07 (×8): qty 100

## 2019-03-07 NOTE — Consult Note (Signed)
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 threatening bleeding with catheter placement. This information has been relayed to patient's RN Sam who will discuss with CCM.   Currently no procedure planned however should patient's INR improve please call on call IR MD to discuss moving forward with percutaneous cholecystostomy.  Consent for procedure has NOT been obtained at this time.   Thank you for this interesting consult.  I greatly enjoyed meeting Keison Glendinning and look forward to participating in their care.  A copy of this report was sent to the requesting provider on this date.  Electronically Signed: Joaquim Nam, PA-C 03/07/2019, 11:37 AM   I spent a total of 40 Minutes  in face to face in clinical consultation, greater than 50% of which was counseling/coordinating care for possible percutaneous cholecystostomy placement.

## 2019-03-07 NOTE — Progress Notes (Signed)
Wasted 60m fentanyl gtt with MSharyn Lull RTherapist, sports

## 2019-03-07 NOTE — Progress Notes (Signed)
NAME:  Glenn Alvarado, MRN:  [id number], DOB:  05-13-1965, LOS: 4 ADMISSION DATE:  02/23/2019, CONSULTATION DATE:  12/28 REFERRING MD:  Dr. Doristine Bosworth, CHIEF COMPLAINT:  Shock, hematemesis    Brief History   54 year old male with history of hepatitis related hepatic cirrhosis.  Admitted 12/27 with nausea vomiting and abdominal pain.  In the p.m. hours of 12/28 he developed a large volume hematemesis and circulatory shock.  History of present illness   Patient is encephalopathic and/or intubated. Therefore history has been obtained from chart review.  54 year old male with past medical history as below, which is significant for hepatitis B, C, hepatic cirrhosis, COPD, CAD, and substance abuse.  He was recently admitted to [facility] for acute hepatitis A.  Course was complicated by hepatic encephalopathy and he was discharged home on lactulose on 12/26.  He was then readmitted on 12/27 after family members noted him to be confused with multiple episodes of vomiting.  In the emergency department he was complaining of right lower quadrant abdominal pain.  He was admitted to the hospitalist service for treatment of hepatic encephalopathy.  He was started on Xifaxan and lactulose.  Then in the p.m. hours of 12/28 he continued to complain of abdominal pain and developed large-volume hematemesis.  As result his blood pressure fell to systolics in the 88F.  Following resuscitation was initiated he was transferred to ICU for further evaluation.   Past Medical History   has a past medical history of Arthritis, Bleeds easily ([place]), CAD (coronary artery disease), Chronic back pain, COPD (chronic obstructive pulmonary disease) ([place]), Daily headache, Depression, Depression with anxiety, Essential hypertension, GERD (gastroesophageal reflux disease), Hepatitis B, Hepatitis C, HLD (hyperlipidemia), Myocardial infarction ([place]), Pneumonia, Substance abuse ([place]), and Tobacco abuse.   Significant Hospital Events     12/22 > 12/16 admit for acute hepatitis A 12/27 > admit for hepatic encephalopathy and abdominal pain 12/28 > upper GI bleeding, shock, ICU transfer. EGD unable to visualize source of bleed 12/29 > Central line and vas cath placed for hyperkalemia  Consults:  GI 12/28   Procedures:  EGD 12/28 unable to visualize source of bleed due to blood clots. No esophageal varices or ulcerations.   Significant Diagnostic Tests:  12/28 CT abdomen > Bilateral nonobstructive nephrolithiasis. No hydronephrosis or renal obstruction is noted. Findings consistent with hepatic cirrhosis. Severe gallbladder distension is noted with large gallstone in the neck of the gallbladder. Enlarged collateral veins are seen in the left upper quadrant consistent with portal hypertension.  Micro Data:  SARS COV 2 12/17 > negative BCX 12/29 > Antimicrobials:  CTX 12/28 >12/31 Pip-tazo 12/31>>  Interim history/subjective:  Worsening abdominal pain overnight.  Off vasopressors since midday yesterday.  Objective   Blood pressure (!) 102/54, pulse 90, temperature (!) 96.3 F (35.7 C), resp. rate 16, height 5' 8"  (1.727 m), weight 82.1 kg, SpO2 94 %.    FiO2 (%):  [30 %] 30 %   Intake/Output Summary (Last 24 hours) at 03/07/2019 1043 Last data filed at 03/07/2019 0900 Gross per 24 hour  Intake 1485.93 ml  Output 3679 ml  Net -2193.07 ml   Filed Weights   03/05/19 0415 03/06/19 0600 03/07/19 0500  Weight: 85.6 kg 83.4 kg 82.1 kg    Physical Exam: General: Chronically ill-appearing man lying in bed no acute distress HENT: [place]/AT, oral mucosa dry Eyes: Icteric sclera, extraocular motion intact Respiratory: Breathing comfortably on nasal cannula, CTA B Cardiovascular: Regular rate and rhythm, no murmurs GI: Mildly  distended abdomen, tender to palpation, especially on the right.  No pain to percussion. Extremities: Mild peripheral edema, no cyanosis Neuro: Fatigued appearing, but arouses to verbal stimulation  answers questions appropriately, moves all extremities but globally weak Skin: Jaundiced GU: Foley with yellow urine  Resolved Hospital Problem list     Assessment & Plan:   54 year old male with cirrhosis secondary ASH/NASH/hepatitis, history of hepatitis A/B/C with recent admission for decompensated cirrhosis related to acute hepatitis A. History of alcohol abuse (reportedly quit 15 years ago), COPD, and polysubstance abuse who was admitted for hepatic encephalopathy. On 12/29 he reported abdominal pain and had hematemesis requiring transfer to the ICU. MELD score 26 and MDF score 35 on admission. Doubt this is alcoholic hepatitis based on extremely elevated LFTs and absence of recent alcohol history.  Hemorrhagic shock secondary to upper GI bleed - unclear source. Improved hemodynamics. S/p PRBC x 2. EGD 12/28 unable to visualize source of bleed due to blood clots. No esophageal varices or ulcerations. -Daily CBC -Transfuse for hemoglobin less than 7 or bleeding with hemodynamic instability -Transition from Protonix infusion to oral twice daily -Completed octreotide today -Daily PT/INR -Appreciate GIs assistance ([facility])  Acute cholecystitis-worsened on ultrasound Acute liver failure Acute hepatic encephalopathy Cirrhosis secondary alcoholic steatohepatitis/NASH/hepatitis Recent hepatitis A infection Coagulopathy Recently treated for acute hepatitis A. Per [facility] outpatient notes, his candidacy for liver transplant barriers would include active smoking (2 cigarettes/day). -He has been accepted at [facility] hepatology and critical care medicine.  We are waiting a bed available for transfer. -Daily CMP, PT/INR -Discussed with GI-recommend heart biliary drain at this point.  Interventional radiology has requested that his INR be less than 1.5 for this procedure.  10 units of FFP stat. -N.p.o., particularly during we will resume lactulose -Resume lactulose when enteral access is  obtained -s/p vitamin K the past -Continue pip-tazo  Acute hypoxemic respiratory failure secondary to critical illness -Titrate down supplemental oxygen as able -Pulmonary hygiene  AKI with metabolic acidosis Hyperkalemia - resolved -Appreciate nephrology's assistance  Hypoglycemia - resolved on TF -Monitor while NPO  COPD -schdeuled nebs  Best practice:  Diet: NPO, small  Pain/Anxiety/Delirium protocol (if indicated): PAD protocol VAP protocol (if indicated): yes DVT prophylaxis: SCDs GI prophylaxis: PPI gtt Glucose control: SSI PRN Mobility: BR Code Status: FULL Family Communication: sister updated at bedside yesterday- her son is also hospitalized after a trauma 2 days ago Disposition: ICU  Labs   CBC: Recent Labs  Lab 03/01/19 0439 02/22/2019 0916 02/22/2019 0910 02/06/2019 1431 03/01/2019 2300 03/05/19 0454 03/05/19 1543 03/06/19 0400 03/07/19 0508  WBC 6.0 8.1 14.9*   < > 17.3* 15.5* 12.5* 7.7 9.6  NEUTROABS 3.9 5.0 11.9*  --   --   --   --   --   --   HGB 14.2 13.5 8.9*  --  9.2* 9.0* 8.8* 8.7* 8.9*  HCT 39.1 38.2* 26.7*  --  27.5* 26.9* 26.0* 26.1* 26.9*  MCV 84.3 85.8 92.4   < > 92.3 92.1 92.5 93.2 93.1  PLT 119* 164 PLATELET CLUMPS NOTED ON SMEAR, COUNT APPEARS DECREASED   < > 121* 101* 85* 70* 91*   < > = values in this interval not displayed.    Basic Metabolic Panel: Recent Labs  Lab 03/05/19 0454 03/05/19 0933 03/05/19 1202 03/05/19 1543 03/06/19 0400 03/06/19 1626 03/07/19 0508  NA 135  --   --  134* 136 138 138  K 4.6  --  4.3 4.4 4.2  4.1 4.0  CL 105  --   --  103 103 106 104  CO2 23  --   --  25 25 26 26   GLUCOSE 107*  --   --  104* 122* 107* 90  BUN 23*  --   --  22* 24* 24* 20  CREATININE 1.71*  --   --  1.72* 1.54* 1.40* 1.14  CALCIUM 7.6*  --   --  7.6* 7.9* 7.9* 8.0*  MG 2.3 2.2  --  2.2 2.4 2.5* 2.6*  PHOS 4.7* 3.8  --  3.6 3.5 3.0 3.1   GFR: Estimated Creatinine Clearance: 79.2 mL/min (by C-G formula based on SCr of 1.14  mg/dL). Recent Labs  Lab 03/06/2019 2025 02/12/2019 0124 03/05/19 0454 03/05/19 1543 03/06/19 0400 03/07/19 0508  WBC  --  11.9* 15.5* 12.5* 7.7 9.6  LATICACIDVEN 5.2* 4.3*  --   --   --   --     Liver Function Tests: Recent Labs  Lab 03/05/2019 0354 02/08/2019 0910 03/05/19 0454 03/05/19 1543 03/06/19 0400 03/06/19 1626 03/07/19 0508  AST 1,075* 1,437* 6,497*  --  5,143*  --  3,174*  ALT 464* 575* 1,784*  --  1,998*  --  1,509*  ALKPHOS 63 54 69  --  78  --  79  BILITOT 22.7* 21.2* 27.1*  --  27.0*  --  28.2*  PROT 6.2* 5.6* 5.9*  --  6.0*  --  6.6  ALBUMIN 2.9* 3.0* 2.8* 2.7* 2.7* 2.6* 2.5*   Recent Labs  Lab 02/28/2019 1624 02/07/2019 0916  LIPASE 26 32   Recent Labs  Lab 02/12/2019 2124 02/14/2019 0916 02/26/2019 0354 02/13/2019 2311  AMMONIA 43* 61* 97* 101*    ABG    Component Value Date/Time   PHART 7.323 (L) 02/22/2019 2318   PCO2ART 33.4 02/11/2019 2318   PO2ART 345.0 (H) 02/27/2019 2318   HCO3 18.1 (L) 02/13/2019 1807   TCO2 19 (L) 02/12/2019 1807   ACIDBASEDEF 9.0 (H) 02/07/2019 1807   O2SAT 67.0 02/16/2019 1807     Coagulation Profile: Recent Labs  Lab 02/25/2019 1755 02/20/2019 2022 03/05/19 0454 03/06/19 0400 03/07/19 0508  INR 1.4* 1.7* 3.2* 2.2* 2.0*    Cardiac Enzymes: No results for input(s): CKTOTAL, CKMB, CKMBINDEX, TROPONINI in the last 168 hours.  HbA1C: Hgb A1c MFr Bld  Date/Time Value Ref Range Status  07/23/2017 06:21 AM 4.4 (L) 4.8 - 5.6 % Final    Comment:    (NOTE) Pre diabetes:          5.7%-6.4% Diabetes:              >6.4% Glycemic control for   <7.0% adults with diabetes     CBG: Recent Labs  Lab 03/06/19 1517 03/06/19 1938 03/06/19 2332 03/07/19 0340 03/07/19 0747  GLUCAP 117* 95 78 82 86   Critical care time: 70 min   This patient is critically ill with multiple organ system failure which requires frequent high complexity decision making, assessment, support, evaluation, and titration of therapies. This was  completed through the application of advanced monitoring technologies and extensive interpretation of multiple databases. During this encounter critical care time was devoted to patient care services described in this note for 40 minutes.  Julian Hy, DO 03/07/19 12:22 PM  Pulmonary & Critical Care

## 2019-03-07 NOTE — Progress Notes (Signed)
[facility] Progress Note    Assessment/ Plan:   1. Severe hyperkalemia, resolved with CRRT--> getting good UF, making urine, numbers better--> will trial off CRRT and see how he responds renal wise.   2. UGIB, s/p EGDs, GI following 3. Cirrhosis, ETOH and HCV/HBV related--> ? Transfer to [facility]  4. Recent HAV infection 5. VDRF 6. Shock on VP, off NE 7. ABLA 8. Metabolic Acidosis, improved 9. CKD3 BL SCr 1.5-2.0  10. Possible cholecystitis- GI following as above  Subjective:    Extubated, repeat abd [place] with gallbladder thickening and distention.  Making 100 mL/ hr of urine now.  Has been tentatively accepted to [facility]    Objective:   BP (!) 102/54   Pulse 90   Temp (!) 96.3 F (35.7 C)   Resp 16   Ht 5' 8"  (1.727 m)   Wt 82.1 kg   SpO2 94%   BMI 27.52 kg/m   Intake/Output Summary (Last 24 hours) at 03/07/2019 1019 Last data filed at 03/07/2019 0900 Gross per 24 hour  Intake 1485.93 ml  Output 3679 ml  Net -2193.07 ml   Weight change: -1.3 kg  Physical Exam: Gen: extubated, moaning in pain CVS: tachycardic Resp: RRR Abd: distended, RUQ tenderness, doesn't want me to touch Ext: 1+ LE edema  Imaging: US Abdomen Limited RUQ  Result Date: 03/07/2019 CLINICAL DATA:  Cholecystitis. EXAM: ULTRASOUND ABDOMEN LIMITED RIGHT UPPER QUADRANT COMPARISON:  March 05, 2019. FINDINGS: Gallbladder: Cholelithiasis is noted with largest stone measuring 2.4 cm. The gallbladder is distended with mild wall thickening measured at 4 mm. The presence or absence of sonographic Murphy's sign was not reported by the technologist. No definite pericholecystic fluid is noted. Common bile duct: Diameter: 8 mm which is mildly dilated. Liver: No focal lesion identified. Increased echogenicity of hepatic parenchyma is noted with nodular contours consistent with hepatic cirrhosis. Mild intrahepatic biliary dilatation is noted. Portal vein is patent on color Doppler imaging with normal  direction of blood flow towards the liver. Other: None. IMPRESSION: Cholelithiasis with mild gallbladder wall thickening and gallbladder distention. If there is clinical concern for cholecystitis, HIDA scan is recommended for further evaluation. Mild intrahepatic and extrahepatic biliary dilatation is noted. Correlation with liver function tests is recommended to rule out distal common bile duct obstruction. Findings consistent with hepatic cirrhosis. No focal sonographic hepatic abnormality is noted. Electronically Signed   By: Marijo Conception M.D.   On: 03/07/2019 08:23    Labs: BMET Recent Labs  Lab 02/14/2019 1944 02/14/2019 2300 03/05/19 0454 03/05/19 0933 03/05/19 1202 03/05/19 1543 03/06/19 0400 03/06/19 1626 03/07/19 0508  NA 135 134* 135  --   --  134* 136 138 138  K 4.9 4.7 4.6  --  4.3 4.4 4.2 4.1 4.0  CL 107 106 105  --   --  103 103 106 104  CO2 15* 20* 23  --   --  25 25 26 26   GLUCOSE 139* 187* 107*  --   --  104* 122* 107* 90  BUN 29* 26* 23*  --   --  22* 24* 24* 20  CREATININE 1.61* 1.76* 1.71*  --   --  1.72* 1.54* 1.40* 1.14  CALCIUM 7.6* 7.4* 7.6*  --   --  7.6* 7.9* 7.9* 8.0*  PHOS 5.5*  --  4.7* 3.8  --  3.6 3.5 3.0 3.1   CBC Recent Labs  Lab 03/01/19 0439 03/05/2019 0916 02/14/2019 0910 02/17/2019 1431 03/05/19 0454 03/05/19  1543 03/06/19 0400 03/07/19 0508  WBC 6.0 8.1 14.9*   < > 15.5* 12.5* 7.7 9.6  NEUTROABS 3.9 5.0 11.9*  --   --   --   --   --   HGB 14.2 13.5 8.9*  --  9.0* 8.8* 8.7* 8.9*  HCT 39.1 38.2* 26.7*  --  26.9* 26.0* 26.1* 26.9*  MCV 84.3 85.8 92.4   < > 92.1 92.5 93.2 93.1  PLT 119* 164 PLATELET CLUMPS NOTED ON SMEAR, COUNT APPEARS DECREASED   < > 101* 85* 70* 91*   < > = values in this interval not displayed.    Medications:    . Chlorhexidine Gluconate Cloth  6 each Topical Daily  . ipratropium-albuterol  3 mL Nebulization Q6H  . lactulose  30 g Oral TID  . mouth rinse  15 mL Mouth Rinse BID  . pantoprazole (PROTONIX) IV  40 mg  Intravenous Q12H  . rifaximin  550 mg Oral BID  . sodium chloride flush  10-40 mL Intracatheter Q12H      Madelon Lips, MD [facility] pgr [phone] 03/07/2019, 10:19 AM

## 2019-03-07 NOTE — Progress Notes (Signed)
PT Cancellation Note  Patient Details Name: Glenn Alvarado MRN: [id number] DOB: Apr 10, 1965   Cancelled Treatment:    Reason Eval/Treat Not Completed: Medical issues which prohibited therapy; per RN pt needs perc drain for gallbladder/cholesystitis but not yet stable for procedure.  Will attempt again on Monday. 03/10/2019.   Reginia Naas 03/07/2019, 2:33 PM  Magda Kiel, [place] [phone] 03/07/2019

## 2019-03-07 NOTE — Progress Notes (Signed)
Pt increasingly agitated and asking for water despite reorientation and education about why he is not allowed to drink water at this time. Minimally redirectable and able to answer orientation questions appropriately. Pt verbalized that he wished to leave the hospital immediately and was educated on why he could not, including CRRT, waiting on a bed at [facility], and need for ICU care. Pt verbalized understanding that he was very sick but still wants to go home anyway. Repositioned and gave pain medication per pain scale protocol. Pt has since calmed down and is now cooperative with treatment plan.

## 2019-03-07 NOTE — Progress Notes (Signed)
Subjective: Development of epigastric and right-sided abd pain.  Objective: Vital signs in last 24 hours: Temp:  [94.8 F (34.9 C)-97.3 F (36.3 C)] 96.3 F (35.7 C) (01/01 0903) Pulse Rate:  [48-157] 90 (01/01 0903) Resp:  [11-24] 16 (01/01 0903) BP: (90-131)/(52-78) 102/54 (01/01 0903) SpO2:  [94 %-100 %] 94 % (01/01 0903) FiO2 (%):  [30 %] 30 % (01/01 0000) Weight:  [82.1 kg] 82.1 kg (01/01 0500) Weight change: -1.3 kg Last BM Date: 03/06/19  PE: GEN:  Jaundiced, intermittently confused, a bit agitated HEENT:  Scleral icterus ABD:  Soft, protuberant, mild epigastric and right upper quadrant tenderness without peritonitis  Lab Results: CBC    Component Value Date/Time   WBC 9.6 03/07/2019 0508   RBC 2.89 (L) 03/07/2019 0508   HGB 8.9 (L) 03/07/2019 0508   HCT 26.9 (L) 03/07/2019 0508   PLT 91 (L) 03/07/2019 0508   MCV 93.1 03/07/2019 0508   MCH 30.8 03/07/2019 0508   MCHC 33.1 03/07/2019 0508   RDW 20.5 (H) 03/07/2019 0508   LYMPHSABS 1.6 02/18/2019 0910   MONOABS 1.1 (H) 02/15/2019 0910   EOSABS 0.0 02/16/2019 0910   BASOSABS 0.0 02/19/2019 0910   CMP     Component Value Date/Time   NA 138 03/07/2019 0508   K 4.0 03/07/2019 0508   CL 104 03/07/2019 0508   CO2 26 03/07/2019 0508   GLUCOSE 90 03/07/2019 0508   BUN 20 03/07/2019 0508   CREATININE 1.14 03/07/2019 0508   CALCIUM 8.0 (L) 03/07/2019 0508   PROT 6.6 03/07/2019 0508   ALBUMIN 2.5 (L) 03/07/2019 0508   AST 3,174 (H) 03/07/2019 0508   ALT 1,509 (H) 03/07/2019 0508   ALKPHOS 79 03/07/2019 0508   BILITOT 28.2 (HH) 03/07/2019 0508   GFRNONAA >60 03/07/2019 0508   GFRAA >60 03/07/2019 2703   Assessment:  1.  Decompensated cirrhosis.  MELD 30s. 2.  Elevated LFTs.  Likely multifactorial.  Acute Hep A as well likely component of shock liver. 3.  Gallbladder distention, now with epigastric and right-sided abdominal pain.  Plan:  1.  Transfer to [facility] is pending (patient has been accepted, but  awaiting bed for placement). 2.  Given uncertainty of how long it will take patient to actually get a bed at [facility], and given patient's persistent GB distention on today's U/S as well as his new-onset EG/RUQ pain, I feel it is overall best for patient to have percutaneous cholecystostomy tube placed.  He is in no shape for cholecystectomy. 3.  [facility] will follow, pending patient's ultimate transfer to [facility].   Landry Dyke 03/07/2019, 12:28 PM   Cell [phone] If no answer or after 5 PM call [phone]

## 2019-03-07 DEATH — deceased

## 2019-03-08 DIAGNOSIS — G9340 Encephalopathy, unspecified: Secondary | ICD-10-CM

## 2019-03-08 DIAGNOSIS — B179 Acute viral hepatitis, unspecified: Secondary | ICD-10-CM

## 2019-03-08 DIAGNOSIS — K7290 Hepatic failure, unspecified without coma: Secondary | ICD-10-CM

## 2019-03-08 LAB — COMPREHENSIVE METABOLIC PANEL
ALT: 860 U/L — ABNORMAL HIGH (ref 0–44)
AST: 1390 U/L — ABNORMAL HIGH (ref 15–41)
Albumin: 2.4 g/dL — ABNORMAL LOW (ref 3.5–5.0)
Alkaline Phosphatase: 70 U/L (ref 38–126)
Anion gap: 10 (ref 5–15)
BUN: 32 mg/dL — ABNORMAL HIGH (ref 6–20)
CO2: 26 mmol/L (ref 22–32)
Calcium: 8.2 mg/dL — ABNORMAL LOW (ref 8.9–10.3)
Chloride: 107 mmol/L (ref 98–111)
Creatinine, Ser: 1.85 mg/dL — ABNORMAL HIGH (ref 0.61–1.24)
GFR calc Af Amer: 47 mL/min — ABNORMAL LOW (ref >60)
GFR calc non Af Amer: 41 mL/min — ABNORMAL LOW (ref >60)
Glucose, Bld: 84 mg/dL (ref 70–99)
Potassium: 3.7 mmol/L (ref 3.5–5.1)
Sodium: 143 mmol/L (ref 135–145)
Total Bilirubin: 25.2 mg/dL (ref 0.3–1.2)
Total Protein: 6.1 g/dL — ABNORMAL LOW (ref 6.5–8.1)

## 2019-03-08 LAB — CBC
HCT: 22.6 % — ABNORMAL LOW (ref 39.0–52.0)
Hemoglobin: 7.3 g/dL — ABNORMAL LOW (ref 13.0–17.0)
MCH: 30.8 pg (ref 26.0–34.0)
MCHC: 32.3 g/dL (ref 30.0–36.0)
MCV: 95.4 fL (ref 80.0–100.0)
Platelets: 44 10*3/uL — ABNORMAL LOW (ref 150–400)
RBC: 2.37 MIL/uL — ABNORMAL LOW (ref 4.22–5.81)
RDW: 20.9 % — ABNORMAL HIGH (ref 11.5–15.5)
WBC: 3.8 10*3/uL — ABNORMAL LOW (ref 4.0–10.5)
nRBC: 0 % (ref 0.0–0.2)

## 2019-03-08 LAB — RENAL FUNCTION PANEL
Albumin: 2.4 g/dL — ABNORMAL LOW (ref 3.5–5.0)
Anion gap: 9 (ref 5–15)
BUN: 38 mg/dL — ABNORMAL HIGH (ref 6–20)
CO2: 27 mmol/L (ref 22–32)
Calcium: 8.6 mg/dL — ABNORMAL LOW (ref 8.9–10.3)
Chloride: 110 mmol/L (ref 98–111)
Creatinine, Ser: 2.05 mg/dL — ABNORMAL HIGH (ref 0.61–1.24)
GFR calc Af Amer: 42 mL/min — ABNORMAL LOW (ref >60)
GFR calc non Af Amer: 36 mL/min — ABNORMAL LOW (ref >60)
Glucose, Bld: 101 mg/dL — ABNORMAL HIGH (ref 70–99)
Phosphorus: 3.3 mg/dL (ref 2.5–4.6)
Potassium: 3.5 mmol/L (ref 3.5–5.1)
Sodium: 146 mmol/L — ABNORMAL HIGH (ref 135–145)

## 2019-03-08 LAB — TYPE AND SCREEN
ABO/RH(D): A POS
Antibody Screen: NEGATIVE

## 2019-03-08 LAB — PROTIME-INR
INR: 1.9 — ABNORMAL HIGH (ref 0.8–1.2)
INR: 1.7 — ABNORMAL HIGH (ref 0.8–1.2)
INR: 1.7 — ABNORMAL HIGH (ref 0.8–1.2)
Prothrombin Time: 21.3 seconds — ABNORMAL HIGH (ref 11.4–15.2)
Prothrombin Time: 20.2 seconds — ABNORMAL HIGH (ref 11.4–15.2)
Prothrombin Time: 19.8 seconds — ABNORMAL HIGH (ref 11.4–15.2)

## 2019-03-08 LAB — BPAM FFP
Blood Product Expiration Date: 202101042359
ISSUE DATE / TIME: 202101011745
Unit Type and Rh: 600

## 2019-03-08 LAB — GLUCOSE, CAPILLARY
Glucose-Capillary: 74 mg/dL (ref 70–99)
Glucose-Capillary: 75 mg/dL (ref 70–99)
Glucose-Capillary: 79 mg/dL (ref 70–99)
Glucose-Capillary: 85 mg/dL (ref 70–99)
Glucose-Capillary: 91 mg/dL (ref 70–99)
Glucose-Capillary: 94 mg/dL (ref 70–99)

## 2019-03-08 LAB — PREPARE FRESH FROZEN PLASMA: Unit division: 0

## 2019-03-08 LAB — MAGNESIUM: Magnesium: 2.8 mg/dL — ABNORMAL HIGH (ref 1.7–2.4)

## 2019-03-08 LAB — PHOSPHORUS: Phosphorus: 3 mg/dL (ref 2.5–4.6)

## 2019-03-08 MED ORDER — NOREPINEPHRINE 4 MG/250ML-% IV SOLN
INTRAVENOUS | Status: AC
  Administered 2019-03-08: 13:00:00 4 ug/min
  Filled 2019-03-08: qty 250

## 2019-03-08 MED ORDER — SODIUM CHLORIDE 0.9% IV SOLUTION: Freq: Once | INTRAVENOUS | Status: DC

## 2019-03-08 MED ORDER — NOREPINEPHRINE 4 MG/250ML-% IV SOLN
0.0000 ug/min | INTRAVENOUS | Status: DC
  Administered 2019-03-08: 17:00:00 2 ug/min via INTRAVENOUS
  Administered 2019-03-09: 19:00:00 3 ug/min via INTRAVENOUS
  Filled 2019-03-08: qty 250

## 2019-03-08 MED ORDER — PIPERACILLIN-TAZOBACTAM 3.375 G IVPB 30 MIN
3.3750 g | Freq: Three times a day (TID) | INTRAVENOUS | Status: DC
  Administered 2019-03-08 – 2019-03-13 (×17): 3.375 g via INTRAVENOUS
  Filled 2019-03-08 (×20): qty 50

## 2019-03-08 NOTE — Progress Notes (Signed)
OT Cancellation Note  Patient Details Name: Glenn Alvarado MRN: [id number] DOB: January 19, 1966   Cancelled Treatment:    Reason Eval/Treat Not Completed: Medical issues which prohibited therapy(INR 1.9 this am. OT to evaluate next available tx day.)   Jefferey Pica OTR/L Acute Rehabilitation Services Pager: [phone] Office: [place] C 03/08/2019, 7:39 AM

## 2019-03-08 NOTE — Progress Notes (Signed)
Subjective: Less abdominal pain (getting more analgesics)  Objective: Vital signs in last 24 hours: Temp:  [96.8 F (36 C)-98.1 F (36.7 C)] 97.3 F (36.3 C) (01/02 1145) Pulse Rate:  [61-101] 64 (01/02 1145) Resp:  [10-26] 17 (01/02 1145) BP: (77-132)/(44-93) 79/53 (01/02 1145) SpO2:  [91 %-100 %] 98 % (01/02 1145) Weight:  [76.9 kg] 76.9 kg (01/02 0500) Weight change: -5.2 kg Last BM Date: 03/08/19  PE: GEN:  Intermittently confused, jaundiced, older-appearing than age ABD:  Protuberant, periumbilical hernia, no significant tenderness  Lab Results: CBC    Component Value Date/Time   WBC 3.8 (L) 03/08/2019 0400   RBC 2.37 (L) 03/08/2019 0400   HGB 7.3 (L) 03/08/2019 0400   HCT 22.6 (L) 03/08/2019 0400   PLT 44 (L) 03/08/2019 0400   MCV 95.4 03/08/2019 0400   MCH 30.8 03/08/2019 0400   MCHC 32.3 03/08/2019 0400   RDW 20.9 (H) 03/08/2019 0400   LYMPHSABS 1.6 02/19/2019 0910   MONOABS 1.1 (H) 03/06/2019 0910   EOSABS 0.0 02/25/2019 0910   BASOSABS 0.0 02/20/2019 0910   CMP     Component Value Date/Time   NA 143 03/08/2019 0400   K 3.7 03/08/2019 0400   CL 107 03/08/2019 0400   CO2 26 03/08/2019 0400   GLUCOSE 84 03/08/2019 0400   BUN 32 (H) 03/08/2019 0400   CREATININE 1.85 (H) 03/08/2019 0400   CALCIUM 8.2 (L) 03/08/2019 0400   PROT 6.1 (L) 03/08/2019 0400   ALBUMIN 2.4 (L) 03/08/2019 0400   AST 1,390 (H) 03/08/2019 0400   ALT 860 (H) 03/08/2019 0400   ALKPHOS 70 03/08/2019 0400   BILITOT 25.2 (HH) 03/08/2019 0400   GFRNONAA 41 (L) 03/08/2019 0400   GFRAA 47 (L) 03/08/2019 0400   Assessment:  1.  Decompensated cirrhosis.  MELD 30s. 2.  Elevated LFTs.  Likely multifactorial.  Downtrending but still markedly elevated.  Acute Hep A as well likely component of shock liver. 3.  Gallbladder distention, now with epigastric and right-sided abdominal pain.  Pain better today.  Plan:  1.  Percutaneous cholecystostomy consultation obtained.  IR deferring  percutaneous cholecystostomy until INR < 1.5.  Platelets now 44, so this will likely also need to be addressed pre drain placement. 2.  Supportive care otherwise. 3.  Transfer to [facility] still pending, with no clear date of when bed there will be available. 4.  [facility] will follow.   Landry Dyke 03/08/2019, 11:49 AM   Cell [phone] If no answer or after 5 PM call [phone]

## 2019-03-08 NOTE — Progress Notes (Signed)
[place] Progress Note Patient Name: Glenn Alvarado DOB: [birth year] MRN: [id number]   Date of Service  03/08/2019  HPI/Events of Note  Coagulopathy - INR = 1.9 this AM.  eICU Interventions  Will order: 1. Transfuse 2 units FFP now.  2. Repeat INR at 9 AM.     Intervention Category Major Interventions: Other:  [name] [name] 03/08/2019, 5:27 AM

## 2019-03-08 NOTE — Progress Notes (Signed)
[facility] Progress Note    Assessment/ Plan:   1. Severe hyperkalemia, resolved with CRRT--> getting good UF, making urine, numbers better--> off CRRT since 03/07/2019.  Monitor for need for HD. 2. UGIB, s/p EGDs, GI following 3. Cirrhosis, ETOH and HCV/HBV related--> ? Transfer to [facility]  4. Recent HAV infection 5. VDRF 6. Shock on VP, off NE 7. ABLA 8. Metabolic Acidosis, improved 9. CKD3 BL SCr 1.5-2.0  10. Possible cholecystitis- GI following as above  Subjective:    Off CRRT yesterday, still making urine, minimal rate of rise.     Objective:   BP (!) 86/50   Pulse 62   Temp (!) 97.5 F (36.4 C)   Resp 13   Ht 5' 8"  (1.727 m)   Wt 76.9 kg   SpO2 100%   BMI 25.78 kg/m   Intake/Output Summary (Last 24 hours) at 03/08/2019 1028 Last data filed at 03/08/2019 0913 Gross per 24 hour  Intake 1520.26 ml  Output 1530 ml  Net -9.74 ml   Weight change: -5.2 kg  Physical Exam: Gen: extubated, sleeping, jaundiced CVS: tachycardic Resp: RRR Abd: distended, RUQ tenderness, doesn't want me to touch Ext: 1+ LE edema  Imaging: DG CHEST PORT 1 VIEW  Result Date: 03/07/2019 CLINICAL DATA:  New central venous catheter placement EXAM: PORTABLE CHEST 1 VIEW COMPARISON:  02/11/2019 FINDINGS: Interval endotracheal extubation. Right neck and left neck vascular catheters are again seen, not significantly changed in location or appearance in comparison to prior radiographs dated 03/06/2019. Heterogeneous bibasilar airspace opacity is increased compared to prior examination, which may reflect increased atelectasis. The heart is unremarkable. IMPRESSION: 1. Right neck and left neck vascular catheters are again seen, not significantly changed in location or appearance in comparison to prior radiographs dated 02/26/2019. 2. Interval endotracheal extubation. 3. Increased heterogeneous bibasilar airspace opacity, which may reflect increased atelectasis. Electronically Signed   By: Eddie Candle M.D.   On: 03/07/2019 15:03   US Abdomen Limited RUQ  Result Date: 03/07/2019 CLINICAL DATA:  Cholecystitis. EXAM: ULTRASOUND ABDOMEN LIMITED RIGHT UPPER QUADRANT COMPARISON:  March 05, 2019. FINDINGS: Gallbladder: Cholelithiasis is noted with largest stone measuring 2.4 cm. The gallbladder is distended with mild wall thickening measured at 4 mm. The presence or absence of sonographic Murphy's sign was not reported by the technologist. No definite pericholecystic fluid is noted. Common bile duct: Diameter: 8 mm which is mildly dilated. Liver: No focal lesion identified. Increased echogenicity of hepatic parenchyma is noted with nodular contours consistent with hepatic cirrhosis. Mild intrahepatic biliary dilatation is noted. Portal vein is patent on color Doppler imaging with normal direction of blood flow towards the liver. Other: None. IMPRESSION: Cholelithiasis with mild gallbladder wall thickening and gallbladder distention. If there is clinical concern for cholecystitis, HIDA scan is recommended for further evaluation. Mild intrahepatic and extrahepatic biliary dilatation is noted. Correlation with liver function tests is recommended to rule out distal common bile duct obstruction. Findings consistent with hepatic cirrhosis. No focal sonographic hepatic abnormality is noted. Electronically Signed   By: Marijo Conception M.D.   On: 03/07/2019 08:23    Labs: BMET Recent Labs  Lab 03/05/19 0454 03/05/19 0933 03/05/19 1202 03/05/19 1543 03/06/19 0400 03/06/19 1626 03/07/19 0508 03/07/19 1642 03/08/19 0400  NA 135  --   --  134* 136 138 138 139 143  K 4.6  --  4.3 4.4 4.2 4.1 4.0 3.8 3.7  CL 105  --   --  103 103  106 104 105 107  CO2 23  --   --  25 25 26 26  21* 26  GLUCOSE 107*  --   --  104* 122* 107* 90 98 84  BUN 23*  --   --  22* 24* 24* 20 26* 32*  CREATININE 1.71*  --   --  1.72* 1.54* 1.40* 1.14 1.62* 1.85*  CALCIUM 7.6*  --   --  7.6* 7.9* 7.9* 8.0* 8.3* 8.2*  PHOS 4.7*  3.8  --  3.6 3.5 3.0 3.1 3.1 3.0   CBC Recent Labs  Lab 02/19/2019 0916 02/14/2019 0910 03/01/2019 1431 03/05/19 1543 03/06/19 0400 03/07/19 0508 03/08/19 0400  WBC 8.1 14.9*   < > 12.5* 7.7 9.6 3.8*  NEUTROABS 5.0 11.9*  --   --   --   --   --   HGB 13.5 8.9*  --  8.8* 8.7* 8.9* 7.3*  HCT 38.2* 26.7*  --  26.0* 26.1* 26.9* 22.6*  MCV 85.8 92.4   < > 92.5 93.2 93.1 95.4  PLT 164 PLATELET CLUMPS NOTED ON SMEAR, COUNT APPEARS DECREASED   < > 85* 70* 91* 44*   < > = values in this interval not displayed.    Medications:    . sodium chloride   Intravenous Once  . Chlorhexidine Gluconate Cloth  6 each Topical Daily  . ipratropium-albuterol  3 mL Nebulization Q6H  . lactulose  30 g Oral TID  . mouth rinse  15 mL Mouth Rinse BID  . pantoprazole (PROTONIX) IV  40 mg Intravenous Q12H  . rifaximin  550 mg Oral BID  . sodium chloride flush  10-40 mL Intracatheter Q12H      Madelon Lips, MD [facility] pgr [phone] 03/08/2019, 10:28 AM

## 2019-03-08 NOTE — Progress Notes (Signed)
NAME:  Glenn Alvarado, MRN:  [id number], DOB:  October 26, 1965, LOS: 5 ADMISSION DATE:  02/08/2019, CONSULTATION DATE:  12/28 REFERRING MD:  Dr. Doristine Bosworth, CHIEF COMPLAINT:  Shock, hematemesis    Brief History   54 year old male with history of hepatitis related hepatic cirrhosis.  Admitted 12/27 with nausea vomiting and abdominal pain.  In the p.m. hours of 12/28 he developed a large volume hematemesis and circulatory shock.  History of present illness   Patient is encephalopathic and/or intubated. Therefore history has been obtained from chart review.  54 year old male with past medical history as below, which is significant for hepatitis B, C, hepatic cirrhosis, COPD, CAD, and substance abuse.  He was recently admitted to [facility] for acute hepatitis A.  Course was complicated by hepatic encephalopathy and he was discharged home on lactulose on 12/26.  He was then readmitted on 12/27 after family members noted him to be confused with multiple episodes of vomiting.  In the emergency department he was complaining of right lower quadrant abdominal pain.  He was admitted to the hospitalist service for treatment of hepatic encephalopathy.  He was started on Xifaxan and lactulose.  Then in the p.m. hours of 12/28 he continued to complain of abdominal pain and developed large-volume hematemesis.  As result his blood pressure fell to systolics in the 81K.  Following resuscitation was initiated he was transferred to ICU for further evaluation.   Past Medical History   has a past medical history of Arthritis, Bleeds easily ([place]), CAD (coronary artery disease), Chronic back pain, COPD (chronic obstructive pulmonary disease) ([place]), Daily headache, Depression, Depression with anxiety, Essential hypertension, GERD (gastroesophageal reflux disease), Hepatitis B, Hepatitis C, HLD (hyperlipidemia), Myocardial infarction ([place]), Pneumonia, Substance abuse ([place]), and Tobacco abuse.   Significant Hospital Events     12/22 > 12/16 admit for acute hepatitis A 12/27 > admit for hepatic encephalopathy and abdominal pain 12/28 > upper GI bleeding, shock, ICU transfer. EGD unable to visualize source of bleed 12/29 > Central line and vas cath placed for hyperkalemia  Consults:  GI 12/28   Procedures:  EGD 12/28 unable to visualize source of bleed due to blood clots. No esophageal varices or ulcerations.   Significant Diagnostic Tests:  12/28 CT abdomen > Bilateral nonobstructive nephrolithiasis. No hydronephrosis or renal obstruction is noted. Findings consistent with hepatic cirrhosis. Severe gallbladder distension is noted with large gallstone in the neck of the gallbladder. Enlarged collateral veins are seen in the left upper quadrant consistent with portal hypertension.  Micro Data:  SARS COV 2 12/17 > negative BCX 12/29 > Antimicrobials:  CTX 12/28 >12/31 Pip-tazo 12/31>>  Interim history/subjective:  Stable today.  Slightly more hypotensive midday.   Objective   Blood pressure (!) 85/53, pulse 67, temperature 98.1 F (36.7 C), resp. rate (!) 21, height 5' 8"  (1.727 m), weight 76.9 kg, SpO2 98 %.        Intake/Output Summary (Last 24 hours) at 03/08/2019 1926 Last data filed at 03/08/2019 1800 Gross per 24 hour  Intake 961.13 ml  Output 1460 ml  Net -498.87 ml   Filed Weights   03/06/19 0600 03/07/19 0500 03/08/19 0500  Weight: 83.4 kg 82.1 kg 76.9 kg    Physical Exam: General: Chronically ill-appearing man lying in bed no acute distress HENT: [place]/AT, oral mucosa dry Eyes: Icteric sclera, extraocular motion intact Respiratory: Breathing comfortably on nasal cannula, CTA B Cardiovascular: Regular rate and rhythm, no murmurs GI: Mildly distended abdomen, mildly  tender to palpation,  especially on the right.  No pain to percussion. Extremities: Mild peripheral edema, no cyanosis Neuro: Fatigued appearing, but arouses to verbal stimulation answers questions appropriately, moves all  extremities but globally weak Skin: Jaundiced GU: Foley with yellow urine  Resolved Hospital Problem list     Assessment & Plan:   54 year old male with cirrhosis secondary ASH/NASH/hepatitis, history of hepatitis A/B/C with recent admission for decompensated cirrhosis related to acute hepatitis A. History of alcohol abuse (reportedly quit 15 years ago), COPD, and polysubstance abuse who was admitted for hepatic encephalopathy. On 12/29 he reported abdominal pain and had hematemesis requiring transfer to the ICU. MELD score 26 and MDF score 35 on admission. Doubt this is alcoholic hepatitis based on extremely elevated LFTs and absence of recent alcohol history.  Hemorrhagic shock secondary to upper GI bleed - unclear source. Improved hemodynamics. S/p PRBC x 2. EGD 12/28 unable to visualize source of bleed due to blood clots. No esophageal varices or ulcerations. -Daily CBC -Transfuse for hemoglobin less than 7 or bleeding with hemodynamic instability -Transition from Protonix infusion to oral twice daily -Completed octreotide today -Daily PT/INR -Appreciate GIs assistance ([place])  Acute cholecystitis-worsened on ultrasound Acute liver failure Acute hepatic encephalopathy Cirrhosis secondary alcoholic steatohepatitis/NASH/hepatitis Recent hepatitis A infection Coagulopathy Recently treated for acute hepatitis A. Per [facility] outpatient notes, his candidacy for liver transplant barriers would include active smoking (2 cigarettes/day). -He has been accepted at [facility] hepatology and critical care medicine.  We are waiting a bed available for transfer. -Daily CMP, PT/INR -Discussed with GI-recommend heart biliary drain at this point.  Interventional radiology has requested that his INR be less than 1.5 for this procedure.  10 units of FFP stat. INR only 1.7 today, doubt could get it down further.  Plt also 44.  Discussed with GI.   -N.p.o -Resume lactulose when enteral access is  obtained -s/p vitamin K the past -Continue pip-tazo  Acute hypoxemic respiratory failure secondary to critical illness -Titrate down supplemental oxygen as able -Pulmonary hygiene  AKI with metabolic acidosis Hyperkalemia - resolved -Appreciate nephrology's assistance  Hypoglycemia - resolved on TF -Monitor while NPO  COPD -schdeuled nebs  Best practice:  Diet: NPO Pain/Anxiety/Delirium protocol (if indicated): PAD protocol VAP protocol (if indicated): yes DVT prophylaxis: SCDs GI prophylaxis: PPI gtt Glucose control: SSI PRN Mobility: BR Code Status: FULL Family Communication: will reach out to sister Disposition: ICU  Labs   CBC: Recent Labs  Lab 02/23/2019 0916 03/05/2019 0910 02/08/2019 1431 03/05/19 0454 03/05/19 1543 03/06/19 0400 03/07/19 0508 03/08/19 0400  WBC 8.1 14.9*   < > 15.5* 12.5* 7.7 9.6 3.8*  NEUTROABS 5.0 11.9*  --   --   --   --   --   --   HGB 13.5 8.9*  --  9.0* 8.8* 8.7* 8.9* 7.3*  HCT 38.2* 26.7*  --  26.9* 26.0* 26.1* 26.9* 22.6*  MCV 85.8 92.4   < > 92.1 92.5 93.2 93.1 95.4  PLT 164 PLATELET CLUMPS NOTED ON SMEAR, COUNT APPEARS DECREASED   < > 101* 85* 70* 91* 44*   < > = values in this interval not displayed.    Basic Metabolic Panel: Recent Labs  Lab 03/05/19 1543 03/06/19 0400 03/06/19 1626 03/07/19 0508 03/07/19 1642 03/08/19 0400 03/08/19 1600  NA 134* 136 138 138 139 143 146*  K 4.4 4.2 4.1 4.0 3.8 3.7 3.5  CL 103 103 106 104 105 107 110  CO2 25 25 26 26  21*  26 27  GLUCOSE 104* 122* 107* 90 98 84 101*  BUN 22* 24* 24* 20 26* 32* 38*  CREATININE 1.72* 1.54* 1.40* 1.14 1.62* 1.85* 2.05*  CALCIUM 7.6* 7.9* 7.9* 8.0* 8.3* 8.2* 8.6*  MG 2.2 2.4 2.5* 2.6*  --  2.8*  --   PHOS 3.6 3.5 3.0 3.1 3.1 3.0 3.3   GFR: Estimated Creatinine Clearance: 40.8 mL/min (A) (by C-G formula based on SCr of 2.05 mg/dL (H)). Recent Labs  Lab 02/12/2019 2025 02/26/2019 0124 03/05/19 1543 03/06/19 0400 03/07/19 0508 03/08/19 0400  WBC  --   11.9* 12.5* 7.7 9.6 3.8*  LATICACIDVEN 5.2* 4.3*  --   --   --   --     Liver Function Tests: Recent Labs  Lab 02/18/2019 0910 03/05/19 0454 03/06/19 0400 03/06/19 1626 03/07/19 0508 03/07/19 1642 03/08/19 0400 03/08/19 1600  AST 1,437* 6,497* 5,143*  --  3,174*  --  1,390*  --   ALT 575* 1,784* 1,998*  --  1,509*  --  860*  --   ALKPHOS 54 69 78  --  79  --  70  --   BILITOT 21.2* 27.1* 27.0*  --  28.2*  --  25.2*  --   PROT 5.6* 5.9* 6.0*  --  6.6  --  6.1*  --   ALBUMIN 3.0* 2.8* 2.7* 2.6* 2.5* 2.6* 2.4* 2.4*   Recent Labs  Lab 02/18/2019 1624 02/27/2019 0916  LIPASE 26 32   Recent Labs  Lab 02/19/2019 2124 02/15/2019 0916 03/06/2019 0354 02/28/2019 2311  AMMONIA 43* 61* 97* 101*    ABG    Component Value Date/Time   PHART 7.323 (L) 02/08/2019 2318   PCO2ART 33.4 02/08/2019 2318   PO2ART 345.0 (H) 02/18/2019 2318   HCO3 18.1 (L) 02/26/2019 1807   TCO2 19 (L) 02/06/2019 1807   ACIDBASEDEF 9.0 (H) 02/17/2019 1807   O2SAT 67.0 02/16/2019 1807     Coagulation Profile: Recent Labs  Lab 03/07/19 0508 03/07/19 1702 03/08/19 0400 03/08/19 1006 03/08/19 1442  INR 2.0* 1.8* 1.9* 1.7* 1.7*    Cardiac Enzymes: No results for input(s): CKTOTAL, CKMB, CKMBINDEX, TROPONINI in the last 168 hours.  HbA1C: Hgb A1c MFr Bld  Date/Time Value Ref Range Status  07/23/2017 06:21 AM 4.4 (L) 4.8 - 5.6 % Final    Comment:    (NOTE) Pre diabetes:          5.7%-6.4% Diabetes:              >6.4% Glycemic control for   <7.0% adults with diabetes     CBG: Recent Labs  Lab 03/07/19 2359 03/08/19 0354 03/08/19 0754 03/08/19 1137 03/08/19 1551  GLUCAP 74 79 75 85 91   Critical care time: 70 min   This patient is critically ill with multiple organ system failure which requires frequent high complexity decision making, assessment, support, evaluation, and titration of therapies. This was completed through the application of advanced monitoring technologies and extensive  interpretation of multiple databases. During this encounter critical care time was devoted to patient care services described in this note for 40 minutes.  Collier Bullock, MD 03/08/19 7:26 PM [facility]

## 2019-03-09 LAB — PREPARE FRESH FROZEN PLASMA
Unit division: 0
Unit division: 0
Unit division: 0
Unit division: 0

## 2019-03-09 LAB — BPAM FFP
Blood Product Expiration Date: 202101042359
Blood Product Expiration Date: 202101042359
Blood Product Expiration Date: 202101072359
Blood Product Expiration Date: 202101072359
Blood Product Expiration Date: 202101042359
ISSUE DATE / TIME: 202101011352
ISSUE DATE / TIME: 202101011514
ISSUE DATE / TIME: 202101020640
ISSUE DATE / TIME: 202101020755
ISSUE DATE / TIME: 202101021135
Unit Type and Rh: 600
Unit Type and Rh: 6200
Unit Type and Rh: 6200
Unit Type and Rh: 6200
Unit Type and Rh: 6200

## 2019-03-09 LAB — RENAL FUNCTION PANEL
Albumin: 2.2 g/dL — ABNORMAL LOW (ref 3.5–5.0)
Anion gap: 9 (ref 5–15)
BUN: 46 mg/dL — ABNORMAL HIGH (ref 6–20)
CO2: 23 mmol/L (ref 22–32)
Calcium: 8.2 mg/dL — ABNORMAL LOW (ref 8.9–10.3)
Chloride: 114 mmol/L — ABNORMAL HIGH (ref 98–111)
Creatinine, Ser: 2.31 mg/dL — ABNORMAL HIGH (ref 0.61–1.24)
GFR calc Af Amer: 36 mL/min — ABNORMAL LOW (ref >60)
GFR calc non Af Amer: 31 mL/min — ABNORMAL LOW (ref >60)
Glucose, Bld: 156 mg/dL — ABNORMAL HIGH (ref 70–99)
Phosphorus: 3 mg/dL (ref 2.5–4.6)
Potassium: 2.9 mmol/L — ABNORMAL LOW (ref 3.5–5.1)
Sodium: 146 mmol/L — ABNORMAL HIGH (ref 135–145)

## 2019-03-09 LAB — GLUCOSE, CAPILLARY
Glucose-Capillary: 102 mg/dL — ABNORMAL HIGH (ref 70–99)
Glucose-Capillary: 106 mg/dL — ABNORMAL HIGH (ref 70–99)
Glucose-Capillary: 110 mg/dL — ABNORMAL HIGH (ref 70–99)
Glucose-Capillary: 161 mg/dL — ABNORMAL HIGH (ref 70–99)
Glucose-Capillary: 87 mg/dL (ref 70–99)
Glucose-Capillary: 89 mg/dL (ref 70–99)
Glucose-Capillary: 99 mg/dL (ref 70–99)

## 2019-03-09 LAB — COMPREHENSIVE METABOLIC PANEL
ALT: 683 U/L — ABNORMAL HIGH (ref 0–44)
AST: 859 U/L — ABNORMAL HIGH (ref 15–41)
Albumin: 2.6 g/dL — ABNORMAL LOW (ref 3.5–5.0)
Alkaline Phosphatase: 75 U/L (ref 38–126)
Anion gap: 12 (ref 5–15)
BUN: 44 mg/dL — ABNORMAL HIGH (ref 6–20)
CO2: 23 mmol/L (ref 22–32)
Calcium: 8.6 mg/dL — ABNORMAL LOW (ref 8.9–10.3)
Chloride: 112 mmol/L — ABNORMAL HIGH (ref 98–111)
Creatinine, Ser: 2.37 mg/dL — ABNORMAL HIGH (ref 0.61–1.24)
GFR calc Af Amer: 35 mL/min — ABNORMAL LOW (ref >60)
GFR calc non Af Amer: 30 mL/min — ABNORMAL LOW (ref >60)
Glucose, Bld: 96 mg/dL (ref 70–99)
Potassium: 3.4 mmol/L — ABNORMAL LOW (ref 3.5–5.1)
Sodium: 147 mmol/L — ABNORMAL HIGH (ref 135–145)
Total Bilirubin: 27.1 mg/dL (ref 0.3–1.2)
Total Protein: 6.9 g/dL (ref 6.5–8.1)

## 2019-03-09 LAB — CULTURE, BLOOD (ROUTINE X 2)
Culture: NO GROWTH
Culture: NO GROWTH

## 2019-03-09 LAB — CBC
HCT: 24.5 % — ABNORMAL LOW (ref 39.0–52.0)
Hemoglobin: 8 g/dL — ABNORMAL LOW (ref 13.0–17.0)
MCH: 31.6 pg (ref 26.0–34.0)
MCHC: 32.7 g/dL (ref 30.0–36.0)
MCV: 96.8 fL (ref 80.0–100.0)
Platelets: 48 10*3/uL — ABNORMAL LOW (ref 150–400)
RBC: 2.53 MIL/uL — ABNORMAL LOW (ref 4.22–5.81)
RDW: 21.3 % — ABNORMAL HIGH (ref 11.5–15.5)
WBC: 4.2 10*3/uL (ref 4.0–10.5)
nRBC: 0 % (ref 0.0–0.2)

## 2019-03-09 LAB — AMMONIA: Ammonia: 29 umol/L (ref 9–35)

## 2019-03-09 LAB — PROTIME-INR
INR: 1.8 — ABNORMAL HIGH (ref 0.8–1.2)
Prothrombin Time: 20.6 seconds — ABNORMAL HIGH (ref 11.4–15.2)

## 2019-03-09 LAB — PHOSPHORUS: Phosphorus: 3.4 mg/dL (ref 2.5–4.6)

## 2019-03-09 LAB — MAGNESIUM: Magnesium: 3.2 mg/dL — ABNORMAL HIGH (ref 1.7–2.4)

## 2019-03-09 MED ORDER — MIDAZOLAM HCL 2 MG/2ML IJ SOLN
0.5000 mg | Freq: Once | INTRAMUSCULAR | Status: AC
  Administered 2019-03-09: 0.5 mg via INTRAVENOUS
  Filled 2019-03-09: qty 2

## 2019-03-09 MED ORDER — OLANZAPINE 5 MG PO TBDP
5.0000 mg | ORAL_TABLET | Freq: Every day | ORAL | Status: DC
  Administered 2019-03-09 – 2019-03-13 (×5): 5 mg via ORAL
  Filled 2019-03-09 (×6): qty 1

## 2019-03-09 MED ORDER — POTASSIUM CHLORIDE 10 MEQ/50ML IV SOLN
10.0000 meq | INTRAVENOUS | Status: AC
  Administered 2019-03-09 – 2019-03-10 (×5): 10 meq via INTRAVENOUS
  Filled 2019-03-09 (×5): qty 50

## 2019-03-09 MED ORDER — DEXTROSE 50 % IV SOLN
INTRAVENOUS | Status: AC
  Filled 2019-03-09: qty 50

## 2019-03-09 MED ORDER — DEXTROSE 5 % IV SOLN: INTRAVENOUS | Status: AC

## 2019-03-09 MED ORDER — OLANZAPINE 5 MG PO TBDP
5.0000 mg | ORAL_TABLET | Freq: Once | ORAL | Status: AC
  Administered 2019-03-09: 5 mg via ORAL
  Filled 2019-03-09: qty 1

## 2019-03-09 NOTE — Progress Notes (Signed)
NAME:  Glenn Alvarado, MRN:  [id number], DOB:  [birth year], LOS: 6 ADMISSION DATE:  02/26/2019, CONSULTATION DATE:  12/28 REFERRING MD:  Dr. Doristine Bosworth, CHIEF COMPLAINT:  Shock, hematemesis    Brief History   54 year old male with history of hepatitis related hepatic cirrhosis.  Admitted 12/27 with nausea vomiting and abdominal pain.  In the p.m. hours of 12/28 he developed a large volume hematemesis and circulatory shock.  History of present illness   54 year old male with past medical history as below, which is significant for hepatitis B, C, hepatic cirrhosis, COPD, CAD, and substance abuse.  He was recently admitted to [facility] for acute hepatitis A.  Course was complicated by hepatic encephalopathy and he was discharged home on lactulose on 12/26.  He was then readmitted on 12/27 after family members noted him to be confused with multiple episodes of vomiting.  In the emergency department he was complaining of right lower quadrant abdominal pain.  He was admitted to the hospitalist service for treatment of hepatic encephalopathy.  He was started on Xifaxan and lactulose.  Then in the p.m. hours of 12/28 he continued to complain of abdominal pain and developed large-volume hematemesis.  As result his blood pressure fell to systolics in the 70W.  Following resuscitation was initiated he was transferred to ICU for further evaluation.   Past Medical History   has a past medical history of Arthritis, Bleeds easily ([place]), CAD (coronary artery disease), Chronic back pain, COPD (chronic obstructive pulmonary disease) ([place]), Daily headache, Depression, Depression with anxiety, Essential hypertension, GERD (gastroesophageal reflux disease), Hepatitis B, Hepatitis C, HLD (hyperlipidemia), Myocardial infarction ([place]), Pneumonia, Substance abuse ([place]), and Tobacco abuse.   Significant Hospital Events   12/22 > 12/16 admit for acute hepatitis A 12/27 > admit for hepatic encephalopathy and abdominal  pain 12/28 > upper GI bleeding, shock, ICU transfer. EGD unable to visualize source of bleed 12/29 > Central line and vas cath placed for hyperkalemia Was on CRRt from 12/29-1/2  1/2 started on low dose levophed for mild hypotension, remains intermittently agitated but oriented. Increasing thrombocytopenia.    Consults:  GI 12/28   Procedures:  EGD 12/28 unable to visualize source of bleed due to blood clots. No esophageal varices or ulcerations.   Significant Diagnostic Tests:  12/28 CT abdomen > Bilateral nonobstructive nephrolithiasis. No hydronephrosis or renal obstruction is noted. Findings consistent with hepatic cirrhosis. Severe gallbladder distension is noted with large gallstone in the neck of the gallbladder. Enlarged collateral veins are seen in the left upper quadrant consistent with portal hypertension.  Micro Data:  SARS COV 2 12/17 > negative BCX 12/29 > Antimicrobials:  CTX 12/28 >12/31 Pip-tazo 12/31>>  Interim history/subjective:   1/3: cont low dose levophed, unchanged from yesterday.   Objective   Blood pressure 105/62, pulse (!) 59, temperature 97.7 F (36.5 C), resp. rate 18, height 5' 8"  (1.727 m), weight 78.5 kg, SpO2 97 %.    FiO2 (%):  [3 %] 3 %   Intake/Output Summary (Last 24 hours) at 03/09/2019 2057 Last data filed at 03/09/2019 1800 Gross per 24 hour  Intake 1654.84 ml  Output 1710 ml  Net -55.16 ml   Filed Weights   03/07/19 0500 03/08/19 0500 03/09/19 0500  Weight: 82.1 kg 76.9 kg 78.5 kg    Physical Exam: General: Chronically ill-appearing man lying in bed intermittently agitated.  HENT: [place]/AT, oral mucosa dry Eyes: Icteric sclera, extraocular motion intact Respiratory: Breathing comfortably on nasal cannula, CTA B Cardiovascular:  Regular rate and rhythm, no murmurs GI: Mildly distended abdomen, mildly  tender to palpation, especially on the right.  No pain to percussion. Extremities: Mild peripheral edema, no cyanosis Neuro:  oriented x 3, moves all extremities but globally weak Skin: Jaundiced GU: Foley with yellow urine  Resolved Hospital Problem list     Assessment & Plan:   54 year old male with cirrhosis secondary ASH/NASH/hepatitis, history of hepatitis A/B/C with recent admission for decompensated cirrhosis related to acute hepatitis A. History of alcohol abuse (reportedly quit 15 years ago), COPD, and polysubstance abuse who was admitted for hepatic encephalopathy. On 12/29 he reported abdominal pain and had hematemesis requiring transfer to the ICU. MELD score 26 and MDF score 35 on admission. Doubt this is alcoholic hepatitis based on extremely elevated LFTs and absence of recent alcohol history.  Hemorrhagic shock secondary to upper GI bleed -  Improved hemodynamics, no active bleeding.  S/p PRBC x 2. EGD 12/28 unable to visualize source of bleed due to blood clots. No esophageal varices or ulcerations. -Daily CBC -Transfuse for hemoglobin less than 7 or bleeding with hemodynamic instability -cont protonix -Daily PT/INR -Appreciate GIs assistance ([place])  Acute liver failure Acute hepatic encephalopathy Cirrhosis secondary alcoholic steatohepatitis/NASH/hepatitis Recent hepatitis A infection Coagulopathy Cholecystitis Recently treated for acute hepatitis A. Per [facility] outpatient notes, his candidacy for liver transplant barriers would include active smoking (2 cigarettes/day). -He has been accepted at [facility] hepatology and critical care medicine.  We are waiting a bed available for transfer. -Daily CMP, PT/INR -Discussed with G -  Lactulose TID. Rifaximin.  Started diet today.  -s/p vitamin K the past -Continue pip-tazo day 4 Holding off on perc cholecystostomy tube, unclear if it would be significantly helpful, would need significant platelet transfusion first, unable to get to 1.5 INR as requested by IR.    Acute hypoxemic respiratory failure secondary to critical illness -stable,  doing well post extubation.  -Pulmonary hygiene  AKI with metabolic acidosis Hyperkalemia - resolved -Appreciate nephrology's assistance Urine output adequate.  Cr improving.    COPD -schdeuled nebs  Best practice:  Diet: regular Pain/Anxiety/Delirium protocol (if indicated):  VAP protocol (if indicated): yes DVT prophylaxis: SCDs GI prophylaxis: PPI gtt Glucose control: SSI PRN Mobility: BR Code Status: FULL Family Communication:  Disposition: ICU  Labs   CBC: Recent Labs  Lab 02/27/2019 0916 02/27/2019 0910 02/22/2019 1431 03/05/19 1543 03/06/19 0400 03/07/19 0508 03/08/19 0400 03/09/19 0350  WBC 8.1 14.9*   < > 12.5* 7.7 9.6 3.8* 4.2  NEUTROABS 5.0 11.9*  --   --   --   --   --   --   HGB 13.5 8.9*  --  8.8* 8.7* 8.9* 7.3* 8.0*  HCT 38.2* 26.7*  --  26.0* 26.1* 26.9* 22.6* 24.5*  MCV 85.8 92.4   < > 92.5 93.2 93.1 95.4 96.8  PLT 164 PLATELET CLUMPS NOTED ON SMEAR, COUNT APPEARS DECREASED   < > 85* 70* 91* 44* 48*   < > = values in this interval not displayed.    Basic Metabolic Panel: Recent Labs  Lab 03/06/19 0400 03/06/19 1626 03/07/19 0508 03/07/19 1642 03/08/19 0400 03/08/19 1600 03/09/19 0350 03/09/19 1613  NA 136 138 138 139 143 146* 147* 146*  K 4.2 4.1 4.0 3.8 3.7 3.5 3.4* 2.9*  CL 103 106 104 105 107 110 112* 114*  CO2 25 26 26  21* 26 27 23 23   GLUCOSE 122* 107* 90 98 84 101* 96 156*  BUN 24* 24* 20 26* 32* 38* 44* 46*  CREATININE 1.54* 1.40* 1.14 1.62* 1.85* 2.05* 2.37* 2.31*  CALCIUM 7.9* 7.9* 8.0* 8.3* 8.2* 8.6* 8.6* 8.2*  MG 2.4 2.5* 2.6*  --  2.8*  --  3.2*  --   PHOS 3.5 3.0 3.1 3.1 3.0 3.3 3.4 3.0   GFR: Estimated Creatinine Clearance: 36.2 mL/min (A) (by C-G formula based on SCr of 2.31 mg/dL (H)). Recent Labs  Lab 02/18/2019 2025 03/06/2019 0124 03/06/19 0400 03/07/19 0508 03/08/19 0400 03/09/19 0350  WBC  --  11.9* 7.7 9.6 3.8* 4.2  LATICACIDVEN 5.2* 4.3*  --   --   --   --     Liver Function Tests: Recent Labs  Lab  03/05/19 0454 03/06/19 0400 03/07/19 0508 03/07/19 1642 03/08/19 0400 03/08/19 1600 03/09/19 0350 03/09/19 1613  AST 6,497* 5,143* 3,174*  --  1,390*  --  859*  --   ALT 1,784* 1,998* 1,509*  --  860*  --  683*  --   ALKPHOS 69 78 79  --  70  --  75  --   BILITOT 27.1* 27.0* 28.2*  --  25.2*  --  27.1*  --   PROT 5.9* 6.0* 6.6  --  6.1*  --  6.9  --   ALBUMIN 2.8* 2.7* 2.5* 2.6* 2.4* 2.4* 2.6* 2.2*   Recent Labs  Lab 02/13/2019 0916  LIPASE 32   Recent Labs  Lab 02/26/2019 2124 02/19/2019 0916 02/18/2019 0354 02/05/2019 2311 03/09/19 1155  AMMONIA 43* 61* 97* 101* 29    ABG    Component Value Date/Time   PHART 7.323 (L) 02/18/2019 2318   PCO2ART 33.4 02/06/2019 2318   PO2ART 345.0 (H) 02/06/2019 2318   HCO3 18.1 (L) 02/09/2019 1807   TCO2 19 (L) 02/26/2019 1807   ACIDBASEDEF 9.0 (H) 02/06/2019 1807   O2SAT 67.0 02/05/2019 1807     Coagulation Profile: Recent Labs  Lab 03/07/19 1702 03/08/19 0400 03/08/19 1006 03/08/19 1442 03/09/19 0350  INR 1.8* 1.9* 1.7* 1.7* 1.8*    Cardiac Enzymes: No results for input(s): CKTOTAL, CKMB, CKMBINDEX, TROPONINI in the last 168 hours.  HbA1C: Hgb A1c MFr Bld  Date/Time Value Ref Range Status  07/23/2017 06:21 AM 4.4 (L) 4.8 - 5.6 % Final    Comment:    (NOTE) Pre diabetes:          5.7%-6.4% Diabetes:              >6.4% Glycemic control for   <7.0% adults with diabetes     CBG: Recent Labs  Lab 03/09/19 0649 03/09/19 0804 03/09/19 1150 03/09/19 1527 03/09/19 2013  GLUCAP 106* 99 110* 161* 102*   Critical care time: 70 min   This patient is critically ill with multiple organ system failure which requires frequent high complexity decision making, assessment, support, evaluation, and titration of therapies. This was completed through the application of advanced monitoring technologies and extensive interpretation of multiple databases. During this encounter critical care time was devoted to patient care services  described in this note for 40 minutes.  Collier Bullock, MD 03/09/19 8:57 PM [facility]

## 2019-03-09 NOTE — Progress Notes (Signed)
OT Cancellation Note  Patient Details Name: Glenn Alvarado MRN: [id number] DOB: 1965-07-07   Cancelled Treatment:    Reason Eval/Treat Not Completed: Medical issues which prohibited therapy(sedated). Pt currently sedated and in 5 point restraints due to earlier agitation. BP 67/43, INR 1.8, Creatinine 2.37, Hematocrit 24.5%. Will hold OT evaluation and continue monitor patient for appropriateness.     [name] 03/09/2019, 12:25 PM   Hulda Humphrey OTR/L Acute Rehabilitation Services Pager: [phone] Office: [phone]

## 2019-03-09 NOTE — Progress Notes (Signed)
[facility] Progress Note    Assessment/ Plan:   1. AKI with severe hyperkalemia: on CRRT from 12/29-1/2 , making urine, low rate of rise of Cr--> follow, no indication for HD today  2. UGIB, s/p EGDs, GI following  3. Acute cholecystitis--> IR consulted for perc chole drain, awaiting optimization of coagulopathy  4. Cirrhosis, ETOH and HCV/HBV related--> ? Transfer to [facility], on Lactulose and rifaximin  5. Recent HAV infection  6. Acute hypoxic RF--> extubated now  7. Mild hypernatremia: D5W gtt start gently   Subjective:    Agitated, confused, on precedex gtt and got some versed.  Na up a little today, will need perc chole drain soon.   Objective:   BP (!) 103/51   Pulse 62   Temp (!) 97.5 F (36.4 C) (Bladder)   Resp (!) 23   Ht 5' 8"  (1.727 m)   Wt 78.5 kg   SpO2 98%   BMI 26.31 kg/m   Intake/Output Summary (Last 24 hours) at 03/09/2019 1950 Last data filed at 03/09/2019 9326 Gross per 24 hour  Intake 681.12 ml  Output 1575 ml  Net -893.88 ml   Weight change: 1.6 kg  Physical Exam: Gen: extubated, sleeping, jaundiced CVS: tachycardic Resp: RRR Abd: distended, RUQ tenderness, doesn't want me to touch Ext: 1+ LE edema  Imaging: DG CHEST PORT 1 VIEW  Result Date: 03/07/2019 CLINICAL DATA:  New central venous catheter placement EXAM: PORTABLE CHEST 1 VIEW COMPARISON:  02/14/2019 FINDINGS: Interval endotracheal extubation. Right neck and left neck vascular catheters are again seen, not significantly changed in location or appearance in comparison to prior radiographs dated 02/19/2019. Heterogeneous bibasilar airspace opacity is increased compared to prior examination, which may reflect increased atelectasis. The heart is unremarkable. IMPRESSION: 1. Right neck and left neck vascular catheters are again seen, not significantly changed in location or appearance in comparison to prior radiographs dated 02/18/2019. 2. Interval endotracheal extubation. 3.  Increased heterogeneous bibasilar airspace opacity, which may reflect increased atelectasis. Electronically Signed   By: Eddie Candle M.D.   On: 03/07/2019 15:03    Labs: BMET Recent Labs  Lab 03/06/19 0400 03/06/19 1626 03/07/19 0508 03/07/19 1642 03/08/19 0400 03/08/19 1600 03/09/19 0350  NA 136 138 138 139 143 146* 147*  K 4.2 4.1 4.0 3.8 3.7 3.5 3.4*  CL 103 106 104 105 107 110 112*  CO2 25 26 26  21* 26 27 23   GLUCOSE 122* 107* 90 98 84 101* 96  BUN 24* 24* 20 26* 32* 38* 44*  CREATININE 1.54* 1.40* 1.14 1.62* 1.85* 2.05* 2.37*  CALCIUM 7.9* 7.9* 8.0* 8.3* 8.2* 8.6* 8.6*  PHOS 3.5 3.0 3.1 3.1 3.0 3.3 3.4   CBC Recent Labs  Lab 03/05/2019 0916 02/20/2019 0910 02/16/2019 1431 03/06/19 0400 03/07/19 0508 03/08/19 0400 03/09/19 0350  WBC 8.1 14.9*   < > 7.7 9.6 3.8* 4.2  NEUTROABS 5.0 11.9*  --   --   --   --   --   HGB 13.5 8.9*  --  8.7* 8.9* 7.3* 8.0*  HCT 38.2* 26.7*  --  26.1* 26.9* 22.6* 24.5*  MCV 85.8 92.4   < > 93.2 93.1 95.4 96.8  PLT 164 PLATELET CLUMPS NOTED ON SMEAR, COUNT APPEARS DECREASED   < > 70* 91* 44* 48*   < > = values in this interval not displayed.    Medications:    . sodium chloride   Intravenous Once  . sodium chloride   Intravenous Once  .  Chlorhexidine Gluconate Cloth  6 each Topical Daily  . ipratropium-albuterol  3 mL Nebulization Q6H  . lactulose  30 g Oral TID  . mouth rinse  15 mL Mouth Rinse BID  . pantoprazole (PROTONIX) IV  40 mg Intravenous Q12H  . rifaximin  550 mg Oral BID  . sodium chloride flush  10-40 mL Intracatheter Q12H      Madelon Lips, MD [facility] pgr [phone] 03/09/2019, 9:52 AM

## 2019-03-09 NOTE — Progress Notes (Addendum)
Pt continues to be amenable directable and oriented with some confusion. untied pt bilateral soft wrist restraints, pt assisting with moving self up in bed when suddenly became uncooperative and belligerent towards this rn. This rn increased precedex from 0.8 mcg/kg to 1.2 mcg/kg and called for immediate asst from nursing staff, nursing arrived to room to assist with safely applying bilateral soft wrist restraints. elink notified, camera visual done by md, pt now crying loudly. Versed 0.5 mg vp ordered by md.

## 2019-03-09 NOTE — Progress Notes (Signed)
During shift report, patient was becoming more increasingly confused and yelling out to staff. Patient was already placed in bilateral wrist restraints however noticed patient managed to manipulate himself in the bed and attempted to pull out dialysis catheter in neck. Stitches still in tact as well as site. Patient placed in 4-point restraints along with posey belt. Given versed by off going nurse at this time.

## 2019-03-09 NOTE — Progress Notes (Signed)
Adrienne Vanduyne 10:25 AM  Subjective: Patient seen and examined and case discussed with my partner Dr. Paulita Fujita as well as the patient's nurse and he was more alert yesterday and more delirious today but is currently sedated but denied abdominal pain yesterday  Objective: Vital signs stable afebrile abdomen is a little distended no obvious significant tenderness rare bowel sounds increased BUN and creatinine slight decrease in transaminases slight increased bili INR about the same CBC stable  Assessment: Cirrhosis made worse with hepatitis A and possibly shock liver as well  Plan: Await transfer to [facility] to their liver team for further work-up and plans I do not see an obvious indication for percutaneous cholecystostomy today consider a repeat ammonia level with next blood draw  [facility]  office [phone] After 5PM or if no answer call [phone]

## 2019-03-09 NOTE — Progress Notes (Signed)
[place] Progress Note Patient Name: Glenn Alvarado DOB: 1965-05-03 MRN: [id number]   Date of Service  03/09/2019  HPI/Events of Note  Agitated Delirium - Precedex IV infusion titrated up. QTc interval = 0.503. Therefore, can't use Haldol.   eICU Interventions  Will order: 1. Versed 0.5 mg IV X 1 now. 2. Check blood glucose now.  3. Give Precedex IV infusion time to work.       Intervention Category Major Interventions: Delirium, psychosis, severe agitation - evaluation and management  Sanjuana Mruk Eugene 03/09/2019, 6:40 AM

## 2019-03-10 DIAGNOSIS — K922 Gastrointestinal hemorrhage, unspecified: Secondary | ICD-10-CM

## 2019-03-10 LAB — RENAL FUNCTION PANEL
Albumin: 2.2 g/dL — ABNORMAL LOW (ref 3.5–5.0)
Albumin: 2.2 g/dL — ABNORMAL LOW (ref 3.5–5.0)
Anion gap: 13 (ref 5–15)
Anion gap: 12 (ref 5–15)
BUN: 38 mg/dL — ABNORMAL HIGH (ref 6–20)
BUN: 32 mg/dL — ABNORMAL HIGH (ref 6–20)
CO2: 22 mmol/L (ref 22–32)
CO2: 19 mmol/L — ABNORMAL LOW (ref 22–32)
Calcium: 8.2 mg/dL — ABNORMAL LOW (ref 8.9–10.3)
Calcium: 8.4 mg/dL — ABNORMAL LOW (ref 8.9–10.3)
Chloride: 112 mmol/L — ABNORMAL HIGH (ref 98–111)
Chloride: 115 mmol/L — ABNORMAL HIGH (ref 98–111)
Creatinine, Ser: 2.55 mg/dL — ABNORMAL HIGH (ref 0.61–1.24)
Creatinine, Ser: 2.15 mg/dL — ABNORMAL HIGH (ref 0.61–1.24)
GFR calc Af Amer: 32 mL/min — ABNORMAL LOW (ref >60)
GFR calc Af Amer: 40 mL/min — ABNORMAL LOW (ref >60)
GFR calc non Af Amer: 28 mL/min — ABNORMAL LOW (ref >60)
GFR calc non Af Amer: 34 mL/min — ABNORMAL LOW (ref >60)
Glucose, Bld: 102 mg/dL — ABNORMAL HIGH (ref 70–99)
Glucose, Bld: 97 mg/dL (ref 70–99)
Phosphorus: 2.7 mg/dL (ref 2.5–4.6)
Phosphorus: 3.3 mg/dL (ref 2.5–4.6)
Potassium: 3.3 mmol/L — ABNORMAL LOW (ref 3.5–5.1)
Potassium: 3.4 mmol/L — ABNORMAL LOW (ref 3.5–5.1)
Sodium: 147 mmol/L — ABNORMAL HIGH (ref 135–145)
Sodium: 146 mmol/L — ABNORMAL HIGH (ref 135–145)

## 2019-03-10 LAB — CBC
HCT: 23.4 % — ABNORMAL LOW (ref 39.0–52.0)
Hemoglobin: 7.8 g/dL — ABNORMAL LOW (ref 13.0–17.0)
MCH: 31.8 pg (ref 26.0–34.0)
MCHC: 33.3 g/dL (ref 30.0–36.0)
MCV: 95.5 fL (ref 80.0–100.0)
Platelets: 57 10*3/uL — ABNORMAL LOW (ref 150–400)
RBC: 2.45 MIL/uL — ABNORMAL LOW (ref 4.22–5.81)
RDW: 22.5 % — ABNORMAL HIGH (ref 11.5–15.5)
WBC: 6.7 10*3/uL (ref 4.0–10.5)
nRBC: 0.4 % — ABNORMAL HIGH (ref 0.0–0.2)

## 2019-03-10 LAB — GLUCOSE, CAPILLARY
Glucose-Capillary: 127 mg/dL — ABNORMAL HIGH (ref 70–99)
Glucose-Capillary: 137 mg/dL — ABNORMAL HIGH (ref 70–99)
Glucose-Capillary: 78 mg/dL (ref 70–99)
Glucose-Capillary: 78 mg/dL (ref 70–99)
Glucose-Capillary: 89 mg/dL (ref 70–99)
Glucose-Capillary: 94 mg/dL (ref 70–99)
Glucose-Capillary: 98 mg/dL (ref 70–99)

## 2019-03-10 LAB — CORTISOL: Cortisol, Plasma: 12.6 ug/dL

## 2019-03-10 LAB — AMMONIA: Ammonia: 23 umol/L (ref 9–35)

## 2019-03-10 LAB — PROTIME-INR
INR: 1.9 — ABNORMAL HIGH (ref 0.8–1.2)
Prothrombin Time: 21.6 seconds — ABNORMAL HIGH (ref 11.4–15.2)

## 2019-03-10 LAB — MAGNESIUM: Magnesium: 2.6 mg/dL — ABNORMAL HIGH (ref 1.7–2.4)

## 2019-03-10 MED ORDER — POTASSIUM CHLORIDE CRYS ER 20 MEQ PO TBCR
30.0000 meq | EXTENDED_RELEASE_TABLET | Freq: Two times a day (BID) | ORAL | Status: DC
  Administered 2019-03-10 (×2): 30 meq via ORAL
  Filled 2019-03-10 (×2): qty 2

## 2019-03-10 MED ORDER — HYDROCORTISONE NA SUCCINATE PF 100 MG IJ SOLR
50.0000 mg | Freq: Four times a day (QID) | INTRAMUSCULAR | Status: DC
  Administered 2019-03-10 – 2019-03-11 (×4): 50 mg via INTRAVENOUS
  Filled 2019-03-10 (×4): qty 2

## 2019-03-10 MED ORDER — IPRATROPIUM-ALBUTEROL 0.5-2.5 (3) MG/3ML IN SOLN
3.0000 mL | Freq: Three times a day (TID) | RESPIRATORY_TRACT | Status: DC
  Administered 2019-03-10 – 2019-03-13 (×9): 3 mL via RESPIRATORY_TRACT
  Filled 2019-03-10 (×10): qty 3

## 2019-03-10 NOTE — Progress Notes (Addendum)
PULMONARY / CRITICAL CARE MEDICINE   NAME:  Glenn Alvarado, MRN:  [id number], DOB:  November 01, 1965, LOS: 7 ADMISSION DATE:  02/28/2019 REFERRING MD:  Dr. Cyndia Diver, CHIEF COMPLAINT:  Aletered mentation  BRIEF HISTORY:     55 year old male with hepatitis B, C, COPD, hepatic cirrhosis, substance use who presents for treatment of acute decompensated cirrhosis.  HISTORY OF PRESENT ILLNESS   Glenn Alvarado is a 54 y.o male with hepatitis b c, copd, cad, hepatic cirrhosis, substance use who was admitted on 12/29 for confusion, nausea, vomiting.  Patient was admitted to the hospital service and initially started on cephalexin and lactulose.  However, subsequently the patient decompensated later that night with systolics in the 78L and therefore was transferred to ICU for further evaluation.  Of note the patient was recently admitted from 12/22-12/26 for liver failure secondary to acute hepatitis a.  Patient was treated with lactulose and subsequently discharged.  SIGNIFICANT PAST MEDICAL HISTORY   CAD Essential hypertension COPD Chronic hepatitis B Hepatitis C Tobacco use disorder SIGNIFICANT EVENTS:   12/28 transfer to ICU due to hemorrhagic shock 12/28 intubated for impending airway compromise 12/29 central line placement 12/29-1/2 CRRT 1/2 started on low-dose Levophed  STUDIES:    12/22 hep A antibody IgM reactive, [name] E ab positive, hep b s non reactive, hep b e ag neg, hcv virus rna neg, HCV antibody reactive, HIV negative  RVP pending Labs pending CBC pending  Chest x-ray 03/07/2019 no acute cardiopulmonary changes, right and left neck vascular catheters visualized  RUQ abdominal ultrasound 03/07/2019 cholelithiasis with mild gallbladder wall thickening and gallbladder distention concerning for cholecystitis.  Mild intrahepatic and extrahepatic biliary dilation.  Hepatic cirrhosis.  CT abdomen pelvis 12/28 bilateral nonobstructive nephrolithiasis, no hydronephrosis or renal obstruction,  aortic atherosclerosis, hepatic cirrhosis, severe gallbladder distention with a large gallstone in neck of gallbladder, moderate sized periumbilical hernia  CT head 12/27 no acute intracranial abnormalities   CULTURES:  Blood culture 12/29 no growth for 5 days COVID-19 12/27 negative  ANTIBIOTICS:   Ceftriaxone 12/28> 12/31 Zosyn 12/31>  LINES/TUBES:   Left internal jugular 12/29 Peripheral IV 12/28 left hand  CONSULTANTS:   Gastroenterology Nephrology  SUBJECTIVE:  Patient states that he is doing well. He was able to state why he was in the hospital.   Nurse mentioned that the patient was beligerent overnight and was spitting at people.   CONSTITUTIONAL: BP (!) 95/56   Pulse 64   Temp 99.9 F (37.7 C)   Resp (!) 27   Ht 5' 8"  (1.727 m)   Wt 81.3 kg   SpO2 94%   BMI 27.25 kg/m   I/O last 3 completed shifts: In: 2818.7 [P.O.:440; I.V.:2234; IV Piggyback:144.7] Out: 3610 [Urine:3110; Stool:500]     FiO2 (%):  [3 %] 3 %  PHYSICAL EXAM: General:  Resting comfortably in bed Neuro:  Alert oriented x 3 Cardiovascular:  Rrr, no murmurs Lungs:  No rales or rhonchi Abdomen:  Firm distended abdomen  Musculoskeletal:  No peripheral edema Skin:  jaundiced   ASSESSMENT AND PLAN    #Hemorrhagic shock secondary to upper GI bleed Hemoglobin stable at 7.8 today. INR stable at 1.9. BUN trending down. Rectal tube with black colored blood suggestive of an old bleed.  Upper endoscopies done 12/28 and 12/29 showed clotted blood at gastric fundus, no esophageal varices, duodenum normal.Possible that patient has gastric varices as suggested by presence of clotted blood.   Hemodynamically stable   -continue levophed -consider octreotide if patient  has active bleed -Daily CBC -Daily PT/INR -Transfusion if patient is greater than 8 due to him having concomittant CAD   #Acute decompensated hepatic cirrhosis Meld score 26, MDF score 35 on admission.  Decompensation likely  in the setting of acute upper GI bleed and acute hepatitis a infection.  -continue lactulose 30gh tid -continue rifaximin 580m bid  -Gastroenterology following -ammonia  -patient to be transferred to [facility]  -precedex, olanzapine for agitation -[facility] has accepted transfer for possible liver transplant  #Acute Cholecystitis  CT abd showing severe gallbladder distention with a large gallstone in neck of gallbladder.   -IR to place percutaneous cholecystectomy drain after coagulopathy is optimized    #AKI with severe hyperkalemia  Patient is s/p CRRT 12/29-1/2. Creatinine stable 2.3-2.5. BUN trending down to 38.  -rfp pending  -nephrology following   #COPD Patient on Symbicort 2 puffs twice daily and Dulera 2 puffs twice daily at home.  #Hypokalemia  K 3.3. Repleted kdur 358m bid   Best Practice / Goals of Care / Disposition.   DVT PROPHYLAXIS:scd SUP: protonix NUTRITION: PO MOBILITY:  GOALS OF CARE: full FAMILY DISCUSSIONS DISPOSITION ICU  LABS  Glucose Recent Labs  Lab 03/09/19 0804 03/09/19 1150 03/09/19 1527 03/09/19 2013 03/10/19 0027 03/10/19 0421  GLUCAP 99 110* 161* 102* 89 94    BMET Recent Labs  Lab 03/08/19 1600 03/09/19 0350 03/09/19 1613  NA 146* 147* 146*  K 3.5 3.4* 2.9*  CL 110 112* 114*  CO2 27 23 23   BUN 38* 44* 46*  CREATININE 2.05* 2.37* 2.31*  GLUCOSE 101* 96 156*    Liver Enzymes Recent Labs  Lab 03/07/19 0508 03/08/19 0400 03/08/19 1600 03/09/19 0350 03/09/19 1613  AST 3,174* 1,390*  --  859*  --   ALT 1,509* 860*  --  683*  --   ALKPHOS 79 70  --  75  --   BILITOT 28.2* 25.2*  --  27.1*  --   ALBUMIN 2.5* 2.4* 2.4* 2.6* 2.2*    Electrolytes Recent Labs  Lab 03/07/19 0508 03/08/19 0400 03/08/19 1600 03/09/19 0350 03/09/19 1613  CALCIUM 8.0* 8.2* 8.6* 8.6* 8.2*  MG 2.6* 2.8*  --  3.2*  --   PHOS 3.1 3.0 3.3 3.4 3.0    CBC Recent Labs  Lab 03/07/19 0508 03/08/19 0400 03/09/19 0350  WBC 9.6 3.8* 4.2   HGB 8.9* 7.3* 8.0*  HCT 26.9* 22.6* 24.5*  PLT 91* 44* 48*    ABG Recent Labs  Lab 03/01/2019 2023 02/23/2019 2318  PHART 7.408 7.323*  PCO2ART 20.6* 33.4  PO2ART 118* 345.0*    Coag's Recent Labs  Lab 02/23/2019 2022 03/08/19 1006 03/08/19 1442 03/09/19 0350  APTT 49*  --   --   --   INR 1.7* 1.7* 1.7* 1.8*    Sepsis Markers Recent Labs  Lab 02/28/2019 2025 02/19/2019 0124  LATICACIDVEN 5.2* 4.3*    Cardiac Enzymes No results for input(s): TROPONINI, PROBNP in the last 168 hours.  PAST MEDICAL HISTORY :   He  has a past medical history of Arthritis, Bleeds easily ([facility] [place] CAD (coronary artery disease), Chronic back pain, COPD (chronic obstructive pulmonary disease) ([facility] Daily headache, Depression, Depression with anxiety, Essential hypertension, GERD (gastroesophageal reflux disease), Hepatitis B, Hepatitis C, HLD (hyperlipidemia), Myocardial infarction ([facility] Pneumonia, Substance abuse ([facility] and Tobacco abuse.  PAST SURGICAL HISTORY:  He  has a past surgical history that includes Foreign Body Removal (Right, 2013); Shoulder arthroscopy (Right, 2010); Shoulder surgery (Right, 2012); Upper  gi endoscopy; IR US Guide Vasc Access Right (08/01/2017); IR Fluoro Guide CV Line Right (08/01/2017); Irrigation and debridement shoulder (Left, 07/25/2017); Esophagogastroduodenoscopy (egd) with propofol (N/A, 02/22/2019); and Esophagogastroduodenoscopy (egd) with propofol (N/A, 02/17/2019).  Allergies  Allergen Reactions  . Acetaminophen Other (See Comments)    Advised not to take d/t liver disease  . Bee Venom Anaphylaxis  . Codeine Hives    No current facility-administered medications on file prior to encounter.   Current Outpatient Medications on File Prior to Encounter  Medication Sig  . amLODipine (NORVASC) 10 MG tablet Take 10 mg by mouth daily.  Marland Kitchen ENSURE PLUS (ENSURE PLUS) LIQD Take 237 mLs by mouth 3 (three) times daily between meals.  . lactulose (CHRONULAC) 10 GM/15ML  solution Take 15 mLs (10 g total) by mouth 3 (three) times daily. Titrate in order to achieve 3-4 bowel movements per day.  . mometasone-formoterol (DULERA) 200-5 MCG/ACT AERO Inhale 2 puffs into the lungs 2 (two) times daily.  . propranolol (INDERAL) 40 MG tablet Take 1 tablet (40 mg total) by mouth 2 (two) times daily.  Marland Kitchen entecavir (BARACLUDE) 0.5 MG tablet Take 0.5 mg by mouth daily.  . SYMBICORT 160-4.5 MCG/ACT inhaler Inhale 2 puffs into the lungs 2 (two) times daily.  . Tenofovir Alafenamide Fumarate 25 MG TABS Take 1 tablet (25 mg total) by mouth daily. (Patient not taking: Reported on 02/26/2019)    FAMILY HISTORY:   His family history includes Alcoholism in his father; Hyperlipidemia in his father.  SOCIAL HISTORY:  He  reports that he has been smoking cigarettes. He has a 3.80 pack-year smoking history. He has never used smokeless tobacco. He reports previous alcohol use. He reports current drug use. Drug: Amphetamines.  REVIEW OF SYSTEMS:    Denies headache, chest pain, dyspnea, abdominal pain  Attending Note:  54 year old male with liver failure and UGI bleed whose hemoglobin is now stable but continues to require precedex and levophed for AMS and BP support.  GI and renal following.  Overnight, agitation and precedex was restarted.  On exam, agitated but easily directable with clear lungs.  I reviewed CXR myself, no acute disease noted.  Discussed with PCCM-NP.  Will continue aggressive care for now.  Called [facility], no ICU beds.  Continue support.  Check cortisol level and start stress dose steroids.  Diet as ordered.  Appreciate input from GI.  PCCM will continue to manage in the ICU.  The patient is critically ill with multiple organ systems failure and requires high complexity decision making for assessment and support, frequent evaluation and titration of therapies, application of advanced monitoring technologies and extensive interpretation of multiple databases.   Critical  Care Time devoted to patient care services described in this note is  31  Minutes. This time reflects time of care of this signee Dr Jennet Maduro. This critical care time does not reflect procedure time, or teaching time or supervisory time of PA/NP/Med student/Med Resident etc but could involve care discussion time.  Rush Farmer, M.D. [facility] Pulmonary/Critical Care Medicine.

## 2019-03-10 NOTE — Progress Notes (Signed)
Nutrition Follow-up     INTERVENTION:  Snack between meals TID  Encourage meal intake   NUTRITION DIAGNOSIS:   Increased nutrient needs related to chronic illness(decompensated liver disease) as evidenced by estimated needs(MELD score 30's. Pending transfer to [facility]).  GOAL:  Patient will meet greater than or equal to 90% of their needs   MONITOR:  PO intake, Skin, Labs, I & O's, Weight trends(acceptance of snacks)   ASSESSMENT:  RD working remotely.  54 yo male admitted with abdominal pain with N/V, shock, hematemesis. PMH includes hepatic cirrhosis, hepatitis B, hepatitis C, COPD, CAD, substance abuse. Recent admission for acute hepatitis and decompensated hepatic cirrhosis. Discharged on 12/26, readmitted 12/27.  1/4-patient extubated and off CRRT 03/07/19. Patient pending transfer to [facility]. GI note IR-percutaneous cholecystostomy deferred due to INR and platelet levels.   Tolerating diet with 50% meal intake- which is insufficient based on est needs. Will add snacks between meals.    Patient at increased risk for malnutrition related to end-stage liver disease. Decreased appetite/intake and reduced glycogen stores. Patient requires small meals or nutritional supplements and avoid skipping meals.   Weight on admission 80.3 kg. Currently 81.3 kg. Edema has improved to upper and lower extremities per nursing.   CBG (last 3)  Recent Labs    03/10/19 0421 03/10/19 0810 03/10/19 1124  GLUCAP 94 137* 78     Intake/Output Summary (Last 24 hours) at 03/10/2019 1315 Last data filed at 03/10/2019 1200 Gross per 24 hour  Intake 2632.9 ml  Output 3175 ml  Net -542.1 ml   Net since admission:  -1.862 ml  Diet Order:   Diet Order            Diet NPO time specified  Diet effective midnight        Diet regular Room service appropriate? Yes; Fluid consistency: Thin  Diet effective now              EDUCATION NEEDS:   Not appropriate for education at this time  Skin:  Skin  Assessment: Reviewed RN Assessment(jaundiced and MASD to buttocks)  Last BM:  1/4-small, TYPE 7 stool- black. Distended abdomen and gross ascites per nursing.  Height:   Ht Readings from Last 1 Encounters:  02/28/2019 5' 8"  (1.727 m)    Weight:   Wt Readings from Last 1 Encounters:  03/10/19 81.3 kg    Ideal Body Weight:  70 kg  BMI:  Body mass index is 27.25 kg/m.  Re- estimated Nutritional Needs:   Kcal:  1800-2000  Protein:  119-133  Fluid:  per MD goals   Colman Cater MS,RD,CSG,LDN Office: [phone] Pager: [phone]

## 2019-03-10 NOTE — Evaluation (Signed)
Occupational Therapy Evaluation Patient Details Name: Glenn Alvarado MRN: [id number] DOB: 25-Jan-1966 Today's Date: 03/10/2019    History of Present Illness 54 year old male with history of hepatitis related hepatic cirrhosis.  Admitted 12/27 with nausea vomiting and abdominal pain.  In the p.m. hours of 12/28 he developed a large volume hematemesis and circulatory shock--acute decompensated cirrhosis.PHMx: hepatitis B, C, hepatic cirrhosis, COPD, CAD, and substance abuse.  He was recently admitted to [facility] for acute hepatitis A.  Course was complicated by hepatic encephalopathy and he was discharged home on lactulose on 12/26.   Clinical Impression   This 54 yo male admitted with above presents to acute OT with generalized weakness, decreased balance, and decreased safety awareness all affecting his safety and independence with basic ADLs. He will benefit from acute OT without need for follow up.    Follow Up Recommendations  No OT follow up;Supervision/Assistance - 24 hour    Equipment Recommendations  None recommended by OT       Precautions / Restrictions Precautions Precautions: Fall Restrictions Weight Bearing Restrictions: No      Mobility Bed Mobility Overal bed mobility: Needs Assistance Bed Mobility: Supine to Sit;Sit to Supine     Supine to sit: Min guard;HOB elevated Sit to supine: Min guard   General bed mobility comments: Assist for lines/safety  Transfers Overall transfer level: Needs assistance Equipment used: 2 person hand held assist Transfers: Sit to/from Stand Sit to Stand: Min assist;+2 safety/equipment         General transfer comment: Assist for balance, safety and lines    Balance Overall balance assessment: Needs assistance Sitting-balance support: No upper extremity supported;Feet supported Sitting balance-Leahy Scale: Good     Standing balance support: Single extremity supported Standing balance-Leahy Scale: Poor Standing  balance comment: UE support and min guard for static standing                           ADL either performed or assessed with clinical judgement   ADL Overall ADL's : Needs assistance/impaired Eating/Feeding: Independent;Sitting   Grooming: Set up;Sitting   Upper Body Bathing: Set up;Sitting   Lower Body Bathing: Minimal assistance;+2 for safety/equipment;Sit to/from stand   Upper Body Dressing : Set up;Sitting   Lower Body Dressing: Minimal assistance;+2 for safety/equipment;Sit to/from stand   Toilet Transfer: Minimal assistance;+2 for safety/equipment Toilet Transfer Details (indicate cue type and reason): Bil HHA Toileting- Clothing Manipulation and Hygiene: Minimal assistance;+2 for safety/equipment;Sit to/from stand               Vision Patient Visual Report: No change from baseline              Pertinent Vitals/Pain Pain Assessment: No/denies pain     Hand Dominance Right   Extremity/Trunk Assessment Upper Extremity Assessment Upper Extremity Assessment: Generalized weakness           Communication Communication Communication: No difficulties   Cognition Arousal/Alertness: Awake/alert Behavior During Therapy: Impulsive Overall Cognitive Status: Impaired/Different from baseline Area of Impairment: Orientation;Memory;Following commands;Safety/judgement;Awareness;Problem solving                 Orientation Level: Disoriented to;Situation   Memory: Decreased short-term memory Following Commands: Follows one step commands consistently Safety/Judgement: Decreased awareness of safety;Decreased awareness of deficits Awareness: Intellectual Problem Solving: Requires verbal cues                Home Living Family/patient expects to be discharged to:: Private residence  Available Help at Discharge: Family;Available PRN/intermittently Type of Home: Apartment Home Access: Level entry     Home Layout: One level                Home Equipment: None          Prior Functioning/Environment Level of Independence: Needs assistance  Gait / Transfers Assistance Needed: independent with hx of falls ADL's / Homemaking Assistance Needed: mother helps with cleaning, cooking            OT Problem List: Impaired balance (sitting and/or standing);Decreased cognition;Decreased safety awareness      OT Treatment/Interventions: Self-care/ADL training;DME and/or AE instruction;Patient/family education;Balance training    OT Goals(Current goals can be found in the care plan section) Acute Rehab OT Goals Patient Stated Goal: go home and see my girls OT Goal Formulation: With patient Time For Goal Achievement: 03/24/19 Potential to Achieve Goals: Good  OT Frequency: Min 2X/week              AM-PAC OT "6 Clicks" Daily Activity     Outcome Measure Help from another person eating meals?: None Help from another person taking care of personal grooming?: A Little Help from another person toileting, which includes using toliet, bedpan, or urinal?: A Little Help from another person bathing (including washing, rinsing, drying)?: A Little Help from another person to put on and taking off regular upper body clothing?: A Little Help from another person to put on and taking off regular lower body clothing?: A Little 6 Click Score: 19   End of Session Equipment Utilized During Treatment: Gait belt  Activity Tolerance: Patient tolerated treatment well Patient left: in bed;with call bell/phone within reach;with bed alarm set;with restraints reapplied(posey (wrist and mitts left off, RN was fine either way))  OT Visit Diagnosis: Unsteadiness on feet (R26.81);Other abnormalities of gait and mobility (R26.89);Muscle weakness (generalized) (M62.81);Other symptoms and signs involving cognitive function                Time: 5974-1638 OT Time Calculation (min): 34 min Charges:  OT General Charges $OT Visit: 1 Visit OT  Evaluation $OT Eval Moderate Complexity: 1 Mod  CathyOTR/L Acute Rehab Services Pager [phone] Office [phone]     03/10/2019, 1:05 PM

## 2019-03-10 NOTE — Progress Notes (Signed)
Subjective: On regular diet, denies abdominal pain. Although he is oriented x3, he is agitated, and has been talking inappropriately as per his nurse.  Objective: Vital signs in last 24 hours: Temp:  [97.5 F (36.4 C)-100.4 F (38 C)] 99.9 F (37.7 C) (01/04 0815) Pulse Rate:  [54-92] 78 (01/04 0815) Resp:  [13-34] 25 (01/04 0815) BP: (67-128)/(43-111) 121/63 (01/04 0815) SpO2:  [76 %-99 %] 98 % (01/04 0815) Weight:  [81.3 kg] 81.3 kg (01/04 0500) Weight change: 2.8 kg Last BM Date: 03/09/19  PE: Lying comfortably on bed, deep icterus, mild pallor GENERAL: Has a rectal tube draining liquid black stool ABDOMEN: Distended abdomen, nontender, small umbilical hernia EXTREMITIES: No edema  Lab Results: Results for orders placed or performed during the hospital encounter of 02/20/2019 (from the past 48 hour(s))  Protime-INR     Status: Abnormal   Collection Time: 03/08/19 10:06 AM  Result Value Ref Range   Prothrombin Time 20.2 (H) 11.4 - 15.2 seconds   INR 1.7 (H) 0.8 - 1.2    Comment: (NOTE) INR goal varies based on device and disease states. Performed at [facility], [place] [address], [place], [place] [address]   Prepare fresh frozen plasma     Status: None   Collection Time: 03/08/19 11:04 AM  Result Value Ref Range   Unit Number [id number]    Blood Component Type THAWED PLASMA    Unit division 00    Status of Unit ISSUED,FINAL    Transfusion Status      OK TO TRANSFUSE Performed at [facility] [address], [place], [place] [address]   Glucose, capillary     Status: None   Collection Time: 03/08/19 11:37 AM  Result Value Ref Range   Glucose-Capillary 85 70 - 99 mg/dL  Protime-INR     Status: Abnormal   Collection Time: 03/08/19  2:42 PM  Result Value Ref Range   Prothrombin Time 19.8 (H) 11.4 - 15.2 seconds   INR 1.7 (H) 0.8 - 1.2    Comment: (NOTE) INR goal varies based on device and disease states. Performed at [facility], [place]  [address], [place], [place] [address]   Glucose, capillary     Status: None   Collection Time: 03/08/19  3:51 PM  Result Value Ref Range   Glucose-Capillary 91 70 - 99 mg/dL  Renal function panel (daily at 1600)     Status: Abnormal   Collection Time: 03/08/19  4:00 PM  Result Value Ref Range   Sodium 146 (H) 135 - 145 mmol/L   Potassium 3.5 3.5 - 5.1 mmol/L   Chloride 110 98 - 111 mmol/L   CO2 27 22 - 32 mmol/L   Glucose, Bld 101 (H) 70 - 99 mg/dL   BUN 38 (H) 6 - 20 mg/dL   Creatinine, Ser 2.05 (H) 0.61 - 1.24 mg/dL   Calcium 8.6 (L) 8.9 - 10.3 mg/dL   Phosphorus 3.3 2.5 - 4.6 mg/dL   Albumin 2.4 (L) 3.5 - 5.0 g/dL   GFR calc non Af Amer 36 (L) >60 mL/min   GFR calc Af Amer 42 (L) >60 mL/min   Anion gap 9 5 - 15    Comment: Performed at [facility] [address] [place], [place] [address]  Glucose, capillary     Status: None   Collection Time: 03/08/19  8:12 PM  Result Value Ref Range   Glucose-Capillary 94 70 - 99 mg/dL  Glucose,  capillary     Status: None   Collection Time: 03/09/19 12:01 AM  Result Value Ref Range   Glucose-Capillary 87 70 - 99 mg/dL  Comprehensive metabolic panel     Status: Abnormal   Collection Time: 03/09/19  3:50 AM  Result Value Ref Range   Sodium 147 (H) 135 - 145 mmol/L   Potassium 3.4 (L) 3.5 - 5.1 mmol/L   Chloride 112 (H) 98 - 111 mmol/L   CO2 23 22 - 32 mmol/L   Glucose, Bld 96 70 - 99 mg/dL   BUN 44 (H) 6 - 20 mg/dL   Creatinine, Ser 2.37 (H) 0.61 - 1.24 mg/dL   Calcium 8.6 (L) 8.9 - 10.3 mg/dL   Total Protein 6.9 6.5 - 8.1 g/dL   Albumin 2.6 (L) 3.5 - 5.0 g/dL   AST 859 (H) 15 - 41 U/L   ALT 683 (H) 0 - 44 U/L   Alkaline Phosphatase 75 38 - 126 U/L   Total Bilirubin 27.1 (HH) 0.3 - 1.2 mg/dL    Comment: CRITICAL VALUE NOTED.  VALUE IS CONSISTENT WITH PREVIOUSLY REPORTED AND CALLED VALUE.   GFR calc non Af Amer 30 (L) >60 mL/min   GFR calc Af Amer 35 (L) >60 mL/min   Anion gap 12 5 - 15    Comment: Performed at [facility] [address], [place], [place] [address]  Protime-INR     Status: Abnormal   Collection Time: 03/09/19  3:50 AM  Result Value Ref Range   Prothrombin Time 20.6 (H) 11.4 - 15.2 seconds   INR 1.8 (H) 0.8 - 1.2    Comment: (NOTE) INR goal varies based on device and disease states. Performed at [facility], [place] [address], [place], [place] [address]   Magnesium     Status: Abnormal   Collection Time: 03/09/19  3:50 AM  Result Value Ref Range   Magnesium 3.2 (H) 1.7 - 2.4 mg/dL    Comment: Performed at [facility] [address], [place], [place] [address]  CBC     Status: Abnormal   Collection Time: 03/09/19  3:50 AM  Result Value Ref Range   WBC 4.2 4.0 - 10.5 K/uL   RBC 2.53 (L) 4.22 - 5.81 MIL/uL   Hemoglobin 8.0 (L) 13.0 - 17.0 g/dL   HCT 24.5 (L) 39.0 - 52.0 %   MCV 96.8 80.0 - 100.0 fL   MCH 31.6 26.0 - 34.0 pg   MCHC 32.7 30.0 - 36.0 g/dL   RDW 21.3 (H) 11.5 - 15.5 %   Platelets 48 (L) 150 - 400 K/uL    Comment: Immature Platelet Fraction may be clinically indicated, consider ordering this additional test [id number] CONSISTENT WITH PREVIOUS RESULT    nRBC 0.0 0.0 - 0.2 %    Comment: Performed at [facility], [place] [address], [place], [place] [address]  Phosphorus     Status: None   Collection Time: 03/09/19  3:50 AM  Result Value Ref Range   Phosphorus 3.4 2.5 - 4.6 mg/dL    Comment: Performed at [facility] [address], [place], [place] [address]  Glucose, capillary     Status: None   Collection Time: 03/09/19  4:36 AM  Result Value Ref Range   Glucose-Capillary 89 70 - 99 mg/dL  Glucose, capillary     Status: Abnormal   Collection Time: 03/09/19  6:49 AM  Result Value Ref Range  Glucose-Capillary 106 (H) 70 - 99 mg/dL   Comment 1 Notify RN   Glucose, capillary     Status: None   Collection Time: 03/09/19  8:04 AM  Result Value Ref Range   Glucose-Capillary 99 70 - 99 mg/dL  Glucose, capillary     Status: Abnormal    Collection Time: 03/09/19 11:50 AM  Result Value Ref Range   Glucose-Capillary 110 (H) 70 - 99 mg/dL  Ammonia     Status: None   Collection Time: 03/09/19 11:55 AM  Result Value Ref Range   Ammonia 29 9 - 35 umol/L    Comment: Performed at [facility], [place] [address], [place], [place] [address]  Glucose, capillary     Status: Abnormal   Collection Time: 03/09/19  3:27 PM  Result Value Ref Range   Glucose-Capillary 161 (H) 70 - 99 mg/dL  Renal function panel (daily at 1600)     Status: Abnormal   Collection Time: 03/09/19  4:13 PM  Result Value Ref Range   Sodium 146 (H) 135 - 145 mmol/L   Potassium 2.9 (L) 3.5 - 5.1 mmol/L   Chloride 114 (H) 98 - 111 mmol/L   CO2 23 22 - 32 mmol/L   Glucose, Bld 156 (H) 70 - 99 mg/dL   BUN 46 (H) 6 - 20 mg/dL   Creatinine, Ser 2.31 (H) 0.61 - 1.24 mg/dL   Calcium 8.2 (L) 8.9 - 10.3 mg/dL   Phosphorus 3.0 2.5 - 4.6 mg/dL   Albumin 2.2 (L) 3.5 - 5.0 g/dL   GFR calc non Af Amer 31 (L) >60 mL/min   GFR calc Af Amer 36 (L) >60 mL/min   Anion gap 9 5 - 15    Comment: Performed at [facility], [address], [place], [place] [address]  Glucose, capillary     Status: Abnormal   Collection Time: 03/09/19  8:13 PM  Result Value Ref Range   Glucose-Capillary 102 (H) 70 - 99 mg/dL  Glucose, capillary     Status: None   Collection Time: 03/10/19 12:27 AM  Result Value Ref Range   Glucose-Capillary 89 70 - 99 mg/dL  Glucose, capillary     Status: None   Collection Time: 03/10/19  4:21 AM  Result Value Ref Range   Glucose-Capillary 94 70 - 99 mg/dL  CBC     Status: Abnormal   Collection Time: 03/10/19  6:07 AM  Result Value Ref Range   WBC 6.7 4.0 - 10.5 K/uL   RBC 2.45 (L) 4.22 - 5.81 MIL/uL   Hemoglobin 7.8 (L) 13.0 - 17.0 g/dL   HCT 23.4 (L) 39.0 - 52.0 %   MCV 95.5 80.0 - 100.0 fL   MCH 31.8 26.0 - 34.0 pg   MCHC 33.3 30.0 - 36.0 g/dL   RDW 22.5 (H) 11.5 - 15.5 %   Platelets 57 (L) 150 - 400 K/uL    Comment: REPEATED TO  VERIFY Immature Platelet Fraction may be clinically indicated, consider ordering this additional test [id number] CONSISTENT WITH PREVIOUS RESULT    nRBC 0.4 (H) 0.0 - 0.2 %    Comment: Performed at [facility], [place] [address] [place], [place] [address]  Renal function panel (daily at 0500)     Status: Abnormal   Collection Time: 03/10/19  6:57 AM  Result Value Ref Range   Sodium 147 (H) 135 - 145 mmol/L   Potassium 3.3 (L) 3.5 - 5.1 mmol/L   Chloride  112 (H) 98 - 111 mmol/L   CO2 22 22 - 32 mmol/L   Glucose, Bld 102 (H) 70 - 99 mg/dL   BUN 38 (H) 6 - 20 mg/dL   Creatinine, Ser 2.55 (H) 0.61 - 1.24 mg/dL   Calcium 8.2 (L) 8.9 - 10.3 mg/dL   Phosphorus 2.7 2.5 - 4.6 mg/dL   Albumin 2.2 (L) 3.5 - 5.0 g/dL   GFR calc non Af Amer 28 (L) >60 mL/min   GFR calc Af Amer 32 (L) >60 mL/min   Anion gap 13 5 - 15    Comment: Performed at [facility] [address], [place], [place] [address]  Magnesium     Status: Abnormal   Collection Time: 03/10/19  6:57 AM  Result Value Ref Range   Magnesium 2.6 (H) 1.7 - 2.4 mg/dL    Comment: Performed at [facility] [address] [place], [place] [address]  Glucose, capillary     Status: Abnormal   Collection Time: 03/10/19  8:10 AM  Result Value Ref Range   Glucose-Capillary 137 (H) 70 - 99 mg/dL  Protime-INR     Status: Abnormal   Collection Time: 03/10/19  8:57 AM  Result Value Ref Range   Prothrombin Time 21.6 (H) 11.4 - 15.2 seconds   INR 1.9 (H) 0.8 - 1.2    Comment: (NOTE) INR goal varies based on device and disease states. Performed at [facility], [place] [address] [place], [place] [address]     Studies/Results: No results found.  Medications: I have reviewed the patient's current medications.  Assessment: Awaiting transfer to [facility] for liver transplant evaluation  Decompensated cirrhosis with recent acute hepatitis A infection, history of hepatitis B and hepatitis C with previous treatment, negative  HBV DNA and HCV PCR RNA this month, and MELD of 36 as of 03/05/2019 Elevated LFTs -multifactorial, including shock liver given recent hemorrhagic shock TB/AST/ALT/ALP of 27.1/859/683/75 on 03/08/2018 PT/INR of 21.6/1.9 today MELD Na as of 03/09/2019 is 39  EGD with large blood clot in fundus with no evidence of esophageal varices, possible underlying gastric varices, currently on Protonix 40 mg every 12 hours  Hepatic encephalopathy-on lactulose 30 g 3 times a day and Xifaxan 550 mg twice a day  Acute kidney injury has been on CRRT Circulatory shock on pressor support-on IV vasopressin if needed, currently on low-dose IV norepinephrine  Cholelithiasis with gallbladder wall thickening, 2.4 cm stone, status post IR evaluation for possible percutaneous cholecystostomy drain placement, on hold due to elevated INR and low platelet, continued on IV Zosyn  Plan: Continue supportive care.  Hb has remained stable at 7.3/8/7.8-was on IV octreotide last week, black liquid stool on rectal tube, EGD showed no esophageal varices but large blood clot in gastric fundus, possible gastric varices.  If patient develops hematemesis or hematochezia, or there is acute drop in hemoglobin,we need to consider CT angiogram and possible TIPS/BRTO if gastric varices are noted.      Ronnette Juniper, MD 03/10/2019, 9:33 AM

## 2019-03-10 NOTE — Evaluation (Signed)
Physical Therapy Evaluation Patient Details Name: Glenn Alvarado MRN: [id number] DOB: 12-08-1965 Today's Date: 03/10/2019   History of Present Illness  54 year old male with history of hepatitis related hepatic cirrhosis.  Admitted 12/27 with nausea vomiting and abdominal pain.  In the p.m. hours of 12/28 he developed a large volume hematemesis and circulatory shock.hepatitis B, C, hepatic cirrhosis, COPD, CAD, and substance abuse.  He was recently admitted to [facility] for acute hepatitis A.  Course was complicated by hepatic encephalopathy and he was discharged home on lactulose on 12/26.  Clinical Impression  Pt admitted with above diagnosis and presents to PT with functional limitations due to deficits listed below (See PT problem list). Pt needs skilled PT to maximize independence and safety to allow discharge to home with family. May need supervision if mental status remains impaired.      Follow Up Recommendations Supervision/Assistance - 24 hour;Home health PT    Equipment Recommendations  Other (comment)(To be determined)    Recommendations for Other Services       Precautions / Restrictions Precautions Precautions: Fall      Mobility  Bed Mobility Overal bed mobility: Needs Assistance Bed Mobility: Supine to Sit;Sit to Supine     Supine to sit: Min guard;HOB elevated Sit to supine: Min guard   General bed mobility comments: Assist for lines/safety  Transfers Overall transfer level: Needs assistance Equipment used: 2 person hand held assist Transfers: Sit to/from Stand Sit to Stand: Min assist;+2 safety/equipment         General transfer comment: Assist for balance, safety and lines  Ambulation/Gait Ambulation/Gait assistance: Min assist;+2 safety/equipment Gait Distance (Feet): 10 Feet(forward/backward) Assistive device: 1 person hand held assist Gait Pattern/deviations: Step-through pattern;Decreased stride length Gait velocity: decr Gait  velocity interpretation: <1.31 ft/sec, indicative of household ambulator General Gait Details: Assist for balance, safety, and lines  Stairs            Wheelchair Mobility    Modified Rankin (Stroke Patients Only)       Balance Overall balance assessment: Needs assistance Sitting-balance support: No upper extremity supported;Feet supported Sitting balance-Leahy Scale: Good     Standing balance support: Single extremity supported Standing balance-Leahy Scale: Poor Standing balance comment: UE support and min guard for static standing                             Pertinent Vitals/Pain Pain Assessment: No/denies pain    Home Living Family/patient expects to be discharged to:: Private residence Living Arrangements: Parent Available Help at Discharge: Family;Available PRN/intermittently Type of Home: Apartment Home Access: Level entry     Home Layout: One level Home Equipment: None      Prior Function Level of Independence: Needs assistance   Gait / Transfers Assistance Needed: independent with hx of falls  ADL's / Homemaking Assistance Needed: mother helps with cleaning, cooking        Hand Dominance   Dominant Hand: Right    Extremity/Trunk Assessment   Upper Extremity Assessment Upper Extremity Assessment: Defer to OT evaluation    Lower Extremity Assessment Lower Extremity Assessment: Generalized weakness       Communication   Communication: No difficulties  Cognition Arousal/Alertness: Awake/alert Behavior During Therapy: Impulsive Overall Cognitive Status: Impaired/Different from baseline Area of Impairment: Orientation;Memory;Following commands;Safety/judgement;Awareness;Problem solving                 Orientation Level: Disoriented to;Situation   Memory: Decreased short-term memory  Following Commands: Follows one step commands consistently Safety/Judgement: Decreased awareness of safety;Decreased awareness of  deficits Awareness: Intellectual Problem Solving: Requires verbal cues        General Comments General comments (skin integrity, edema, etc.): On RA pt with SpO2 >90% with RR upper 30's. RR at rest and not drinking 20's. Replaced O2 at 3L    Exercises     Assessment/Plan    PT Assessment Patient needs continued PT services  PT Problem List Decreased strength;Decreased activity tolerance;Decreased balance;Decreased mobility;Decreased cognition;Decreased safety awareness       PT Treatment Interventions DME instruction;Gait training;Functional mobility training;Therapeutic activities;Therapeutic exercise;Balance training;Cognitive remediation;Patient/family education    PT Goals (Current goals can be found in the Care Plan section)  Acute Rehab PT Goals Patient Stated Goal: go home and see my girls PT Goal Formulation: With patient Time For Goal Achievement: 03/24/19 Potential to Achieve Goals: Good    Frequency Min 3X/week   Barriers to discharge Decreased caregiver support Unsure of home support    Co-evaluation PT/OT/SLP Co-Evaluation/Treatment: Yes Reason for Co-Treatment: Necessary to address cognition/behavior during functional activity;For patient/therapist safety           AM-PAC PT "6 Clicks" Mobility  Outcome Measure Help needed turning from your back to your side while in a flat bed without using bedrails?: None Help needed moving from lying on your back to sitting on the side of a flat bed without using bedrails?: None Help needed moving to and from a bed to a chair (including a wheelchair)?: A Little Help needed standing up from a chair using your arms (e.g., wheelchair or bedside chair)?: A Little Help needed to walk in hospital room?: A Little Help needed climbing 3-5 steps with a railing? : A Little 6 Click Score: 20    End of Session Equipment Utilized During Treatment: Oxygen Activity Tolerance: Patient tolerated treatment well Patient left: in  bed;with call bell/phone within reach;with bed alarm set;with nursing/sitter in room;with restraints reapplied Nurse Communication: Mobility status PT Visit Diagnosis: Unsteadiness on feet (R26.81);Muscle weakness (generalized) (M62.81)    Time: 9276-3943 PT Time Calculation (min) (ACUTE ONLY): 34 min   Charges:   PT Evaluation $PT Eval Moderate Complexity: 1 [place] Pager [phone] Office [place] 03/10/2019, 10:47 AM

## 2019-03-10 NOTE — Progress Notes (Signed)
Patient ID: Glenn Alvarado, male   DOB: Apr 02, 1965, 54 y.o.   MRN: [id number]    Pt is on IR radar For tentative IR perc chole drain placement  INR 1.9 today Would need under 1.5 to safely perform percutaneous cholecystostomy drain  Dr Pascal Lux has spoken to Dr Verne Spurr today Rec: CCS evaluation and  Recommendation  She will place consult Keep pt npo after MN- if IR needs to move forward 03/11/19  She is agreeable
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
## 2019-03-10 NOTE — Progress Notes (Signed)
  [facility] Progress Note    Assessment/ Plan:   1. AKI with severe hyperkalemia: on CRRT from 12/29-1/2 , making good volume of urine, low rate of rise of Cr--> follow, no indication for HD today  2. UGIB, s/p EGDs, GI following  3. Acute cholecystitis--> IR consulted for perc chole drain, awaiting optimization of coagulopathy  4. Cirrhosis, ETOH and HCV/HBV related--> ? Transfer to [facility], on Lactulose and rifaximin. Per GI medicine has been accepted to [facility] pending bed availability.   5. Recent HAV infection  6. Acute hypoxic RF--> extubated now  7. Mild hypernatremia: D5W gtt cont at current rate for now 8. Hypokalemia: being replaced   Subjective:    UOP 3.1 L yesterday.  This morning he is fully oriented but agitated and in restraints.  He is yelling continuously.    Objective:   BP 121/63   Pulse 78   Temp 99.9 F (37.7 C)   Resp (!) 25   Ht 5' 8"  (1.727 m)   Wt 81.3 kg   SpO2 98%   BMI 27.25 kg/m   Intake/Output Summary (Last 24 hours) at 03/10/2019 0852 Last data filed at 03/10/2019 0700 Gross per 24 hour  Intake 2487.08 ml  Output 2975 ml  Net -487.92 ml   Weight change: 2.8 kg  Physical Exam: Gen: awake and alert in bed CVS: tachycardic Resp: RRR Abd: distended, nontender with palpation Ext: 1+ LE edema Neuro: oriented x 3, conversant.   Imaging: No results found.  Labs: BMET Recent Labs  Lab 03/07/19 0508 03/07/19 1642 03/08/19 0400 03/08/19 1600 03/09/19 0350 03/09/19 1613 03/10/19 0657  NA 138 139 143 146* 147* 146* 147*  K 4.0 3.8 3.7 3.5 3.4* 2.9* 3.3*  CL 104 105 107 110 112* 114* 112*  CO2 26 21* 26 27 23 23 22   GLUCOSE 90 98 84 101* 96 156* 102*  BUN 20 26* 32* 38* 44* 46* 38*  CREATININE 1.14 1.62* 1.85* 2.05* 2.37* 2.31* 2.55*  CALCIUM 8.0* 8.3* 8.2* 8.6* 8.6* 8.2* 8.2*  PHOS 3.1 3.1 3.0 3.3 3.4 3.0 2.7   CBC Recent Labs  Lab 02/14/2019 0916 02/18/2019 0910 03/01/2019 1431 03/07/19 0508 03/08/19 0400  03/09/19 0350 03/10/19 0607  WBC 8.1 14.9*   < > 9.6 3.8* 4.2 6.7  NEUTROABS 5.0 11.9*  --   --   --   --   --   HGB 13.5 8.9*  --  8.9* 7.3* 8.0* 7.8*  HCT 38.2* 26.7*  --  26.9* 22.6* 24.5* 23.4*  MCV 85.8 92.4   < > 93.1 95.4 96.8 95.5  PLT 164 PLATELET CLUMPS NOTED ON SMEAR, COUNT APPEARS DECREASED   < > 91* 44* 48* 57*   < > = values in this interval not displayed.    Medications:    . sodium chloride   Intravenous Once  . sodium chloride   Intravenous Once  . Chlorhexidine Gluconate Cloth  6 each Topical Daily  . ipratropium-albuterol  3 mL Nebulization TID  . lactulose  30 g Oral TID  . mouth rinse  15 mL Mouth Rinse BID  . OLANZapine zydis  5 mg Oral QHS  . pantoprazole (PROTONIX) IV  40 mg Intravenous Q12H  . rifaximin  550 mg Oral BID  . sodium chloride flush  10-40 mL Intracatheter Q12H      Madelon Lips, MD [facility] pgr [phone] 03/10/2019, 8:52 AM

## 2019-03-11 DIAGNOSIS — N179 Acute kidney failure, unspecified: Secondary | ICD-10-CM

## 2019-03-11 DIAGNOSIS — J438 Other emphysema: Secondary | ICD-10-CM

## 2019-03-11 LAB — COMPREHENSIVE METABOLIC PANEL
ALT: 450 U/L — ABNORMAL HIGH (ref 0–44)
AST: 290 U/L — ABNORMAL HIGH (ref 15–41)
Albumin: 2.4 g/dL — ABNORMAL LOW (ref 3.5–5.0)
Alkaline Phosphatase: 78 U/L (ref 38–126)
Anion gap: 10 (ref 5–15)
BUN: 25 mg/dL — ABNORMAL HIGH (ref 6–20)
CO2: 20 mmol/L — ABNORMAL LOW (ref 22–32)
Calcium: 8.5 mg/dL — ABNORMAL LOW (ref 8.9–10.3)
Chloride: 114 mmol/L — ABNORMAL HIGH (ref 98–111)
Creatinine, Ser: 1.87 mg/dL — ABNORMAL HIGH (ref 0.61–1.24)
GFR calc Af Amer: 47 mL/min — ABNORMAL LOW (ref >60)
GFR calc non Af Amer: 40 mL/min — ABNORMAL LOW (ref >60)
Glucose, Bld: 140 mg/dL — ABNORMAL HIGH (ref 70–99)
Potassium: 3.3 mmol/L — ABNORMAL LOW (ref 3.5–5.1)
Sodium: 144 mmol/L (ref 135–145)
Total Bilirubin: 35.2 mg/dL (ref 0.3–1.2)
Total Protein: 7.6 g/dL (ref 6.5–8.1)

## 2019-03-11 LAB — CBC
HCT: 27.5 % — ABNORMAL LOW (ref 39.0–52.0)
Hemoglobin: 9.2 g/dL — ABNORMAL LOW (ref 13.0–17.0)
MCH: 32.3 pg (ref 26.0–34.0)
MCHC: 33.5 g/dL (ref 30.0–36.0)
MCV: 96.5 fL (ref 80.0–100.0)
Platelets: 81 10*3/uL — ABNORMAL LOW (ref 150–400)
RBC: 2.85 MIL/uL — ABNORMAL LOW (ref 4.22–5.81)
RDW: 23.5 % — ABNORMAL HIGH (ref 11.5–15.5)
WBC: 11.9 10*3/uL — ABNORMAL HIGH (ref 4.0–10.5)
nRBC: 0.4 % — ABNORMAL HIGH (ref 0.0–0.2)

## 2019-03-11 LAB — AMMONIA: Ammonia: 40 umol/L — ABNORMAL HIGH (ref 9–35)

## 2019-03-11 LAB — PHOSPHORUS: Phosphorus: 3.8 mg/dL (ref 2.5–4.6)

## 2019-03-11 LAB — GLUCOSE, CAPILLARY
Glucose-Capillary: 100 mg/dL — ABNORMAL HIGH (ref 70–99)
Glucose-Capillary: 101 mg/dL — ABNORMAL HIGH (ref 70–99)
Glucose-Capillary: 114 mg/dL — ABNORMAL HIGH (ref 70–99)
Glucose-Capillary: 118 mg/dL — ABNORMAL HIGH (ref 70–99)

## 2019-03-11 LAB — MAGNESIUM: Magnesium: 2.4 mg/dL (ref 1.7–2.4)

## 2019-03-11 LAB — PROTIME-INR
INR: 1.8 — ABNORMAL HIGH (ref 0.8–1.2)
Prothrombin Time: 21 seconds — ABNORMAL HIGH (ref 11.4–15.2)

## 2019-03-11 MED ORDER — WHITE PETROLATUM EX OINT: TOPICAL_OINTMENT | CUTANEOUS | Status: DC | PRN

## 2019-03-11 MED ORDER — PROPRANOLOL HCL 10 MG PO TABS
10.0000 mg | ORAL_TABLET | Freq: Two times a day (BID) | ORAL | Status: DC
  Administered 2019-03-11 (×2): 10 mg via ORAL
  Filled 2019-03-11 (×3): qty 1

## 2019-03-11 MED ORDER — MOMETASONE FURO-FORMOTEROL FUM 200-5 MCG/ACT IN AERO
2.0000 | INHALATION_SPRAY | Freq: Two times a day (BID) | RESPIRATORY_TRACT | Status: DC
  Administered 2019-03-11 – 2019-03-13 (×5): 2 via RESPIRATORY_TRACT
  Filled 2019-03-11: qty 8.8

## 2019-03-11 MED ORDER — POTASSIUM CHLORIDE CRYS ER 20 MEQ PO TBCR
40.0000 meq | EXTENDED_RELEASE_TABLET | Freq: Once | ORAL | Status: AC
  Administered 2019-03-11: 11:00:00 40 meq via ORAL
  Filled 2019-03-11: qty 2

## 2019-03-11 MED ORDER — WHITE PETROLATUM GEL: Status: DC | PRN

## 2019-03-11 NOTE — Progress Notes (Signed)
Pt arrive approx 1315. Pt emotionally labile; vacillates between tears and calm/anxious conversation. Incessant talking.   1540 received call from patient's step father Trenton Gammon inquiring about updates and discharge plan after receivng call from patient.   1740 spoke with Trenton Gammon via phone, informed not aware of any discharge date at this time and informed patent has some confusion, anxiety and emotionally labile, and speaks of missing family.   Educated patient on pain management and available pain meds. Patient attempting to self manage pain, however, pain level/intensity reduced patient to tears. Informed patient pain medication can help manage pain before pain becomes extreme and unbearable.   Approx 200 ml dark brown /black stool in flexi bag at [place].

## 2019-03-11 NOTE — Progress Notes (Signed)
Pt has bed on 3E.  Report given to Sharmon Leyden, receiving RN.  Pt to be transported by bed.   Spoke w/ pts step-father Trenton Gammon to relay pts new room number.

## 2019-03-11 NOTE — Progress Notes (Signed)
Spoke w/ [facility] RN for updates on pt. Relayed that pt will be transferred to [facility] progressive care and provided unit phone number. Per [facility], still no bed available for pt at this time.

## 2019-03-11 NOTE — Progress Notes (Signed)
[facility] Progress Note    Assessment/ Plan:   1. AKI with severe hyperkalemia: on CRRT from 12/29-1/2.  Based on labs and falling Cr to 1.87 today he's recovering renal function.  D/W RN - ok to remove HD catheter.  Nephrology will sign off for now.  Certainly with comorbids at risk for future AKI but for now recovering.  Should he not return to baseline his PCP can refer him to nephrology for CKD care in the outpt setting.    2. UGIB, s/p EGDs, GI following  3. Acute cholecystitis--> IR consulted for perc chole drain, awaiting optimization of coagulopathy  4. Cirrhosis, ETOH and HCV/HBV related--> ? Transfer to [facility], on Lactulose and rifaximin. Per GI medicine has been accepted to [facility] pending bed availability.   5. Recent HAV infection  6. Acute hypoxic RF--> extubated now  7. Mild hypernatremia: resolved with D5W, per primary 8. Hypokalemia: being replaced  Will sign off.  Please let us know if we can help with additional issues.    Subjective:    UOP 4.4 L yesterday.  This AM calmer but still with some confusion.     Objective:   BP 137/75   Pulse (!) 107   Temp 98.2 F (36.8 C) (Oral)   Resp 20   Ht 5' 8"  (1.727 m)   Wt 79.2 kg   SpO2 95%   BMI 26.55 kg/m   Intake/Output Summary (Last 24 hours) at 03/11/2019 0814 Last data filed at 03/11/2019 0800 Gross per 24 hour  Intake 1295.53 ml  Output 5235 ml  Net -3939.47 ml   Weight change: -2.1 kg  Physical Exam: Gen: awake and alert in bed CVS: tachycardic Resp: RRR Abd: distended, nontender with palpation Ext: 1+ LE edema Neuro: oriented x 3, conversant.   Imaging: No results found.  Labs: BMET Recent Labs  Lab 03/07/19 1642 03/08/19 0400 03/08/19 1600 03/09/19 0350 03/09/19 1613 03/10/19 0657 03/10/19 1705  NA 139 143 146* 147* 146* 147* 146*  K 3.8 3.7 3.5 3.4* 2.9* 3.3* 3.4*  CL 105 107 110 112* 114* 112* 115*  CO2 21* 26 27 23 23 22  19*  GLUCOSE 98 84 101* 96 156* 102* 97   BUN 26* 32* 38* 44* 46* 38* 32*  CREATININE 1.62* 1.85* 2.05* 2.37* 2.31* 2.55* 2.15*  CALCIUM 8.3* 8.2* 8.6* 8.6* 8.2* 8.2* 8.4*  PHOS 3.1 3.0 3.3 3.4 3.0 2.7 3.3   CBC Recent Labs  Lab 02/07/2019 0910 02/28/2019 1431 03/08/19 0400 03/09/19 0350 03/10/19 0607 03/11/19 0558  WBC 14.9*   < > 3.8* 4.2 6.7 11.9*  NEUTROABS 11.9*  --   --   --   --   --   HGB 8.9*  --  7.3* 8.0* 7.8* 9.2*  HCT 26.7*  --  22.6* 24.5* 23.4* 27.5*  MCV 92.4   < > 95.4 96.8 95.5 96.5  PLT PLATELET CLUMPS NOTED ON SMEAR, COUNT APPEARS DECREASED   < > 44* 48* 57* 81*   < > = values in this interval not displayed.    Medications:    . sodium chloride   Intravenous Once  . sodium chloride   Intravenous Once  . Chlorhexidine Gluconate Cloth  6 each Topical Daily  . hydrocortisone sod succinate (SOLU-CORTEF) inj  50 mg Intravenous Q6H  . ipratropium-albuterol  3 mL Nebulization TID  . lactulose  30 g Oral TID  . mouth rinse  15 mL Mouth Rinse BID  . mometasone-formoterol  2  puff Inhalation BID  . OLANZapine zydis  5 mg Oral QHS  . pantoprazole (PROTONIX) IV  40 mg Intravenous Q12H  . potassium chloride  30 mEq Oral BID  . rifaximin  550 mg Oral BID  . sodium chloride flush  10-40 mL Intracatheter Q12H      Madelon Lips, MD [facility] pgr [phone] 03/11/2019, 8:14 AM

## 2019-03-11 NOTE — Progress Notes (Addendum)
PULMONARY / CRITICAL CARE MEDICINE   NAME:  Glenn Alvarado, MRN:  [id number], DOB:  03-14-1965, LOS: 8 ADMISSION DATE:  02/08/2019 REFERRING MD:  Dr. Cyndia Diver, CHIEF COMPLAINT:  Altered mentation  BRIEF HISTORY:     54 year old male with hepatitis B, C, COPD, hepatic cirrhosis, substance use who presents for treatment of acute decompensated cirrhosis.  HISTORY OF PRESENT ILLNESS   Glenn Alvarado is a 54 y.o male with hepatitis b c, copd, cad, hepatic cirrhosis, substance use who was admitted on 12/29 for confusion, nausea, vomiting.  Patient was admitted to the hospital service and initially started on cephalexin and lactulose.  However, subsequently the patient decompensated later that night with systolics in the 42A and therefore was transferred to ICU for further evaluation.  Of note the patient was recently admitted from 12/22-12/26 for liver failure secondary to acute hepatitis a.  Patient was treated with lactulose and subsequently discharged.  SIGNIFICANT PAST MEDICAL HISTORY   CAD Essential hypertension COPD Chronic hepatitis B Hepatitis C Tobacco use disorder SIGNIFICANT EVENTS:   12/28 transfer to ICU due to hemorrhagic shock 12/28 intubated for impending airway compromise 12/29 central line placement 12/29-1/2 CRRT 1/2>1/4 on low-dose Levophed  STUDIES:    12/22 hep A antibody IgM reactive, [name] E ab positive, hep b s non reactive, hep b e ag neg, hcv virus rna neg, HCV antibody reactive, HIV negative  RVP pending Labs pending CBC pending  Chest x-ray 03/07/2019 no acute cardiopulmonary changes, right and left neck vascular catheters visualized  RUQ abdominal ultrasound 03/07/2019 cholelithiasis with mild gallbladder wall thickening and gallbladder distention concerning for cholecystitis.  Mild intrahepatic and extrahepatic biliary dilation.  Hepatic cirrhosis.  CT abdomen pelvis 12/28 bilateral nonobstructive nephrolithiasis, no hydronephrosis or renal obstruction,  aortic atherosclerosis, hepatic cirrhosis, severe gallbladder distention with a large gallstone in neck of gallbladder, moderate sized periumbilical hernia  CT head 12/27 no acute intracranial abnormalities   CULTURES:  Blood culture 12/29 no growth for 5 days COVID-19 12/27 negative  ANTIBIOTICS:   Ceftriaxone 12/28> 12/31 Zosyn 12/31>  LINES/TUBES:   Left internal jugular 12/29 Peripheral IV 12/28 left hand  CONSULTANTS:   Gastroenterology Nephrology  SUBJECTIVE:  Patient states that he is doing well. He was able to state why he was in the hospital.   Nurse mentioned that the patient was beligerent overnight and was spitting at people.   CONSTITUTIONAL: BP (!) 142/91   Pulse (!) 106   Temp 98.4 F (36.9 C) (Oral)   Resp (!) 23   Ht 5' 8"  (1.727 m)   Wt 79.2 kg   SpO2 96%   BMI 26.55 kg/m   I/O last 3 completed shifts: In: 3482.7 [P.O.:920; I.V.:2368.1; IV Piggyback:194.7] Out: 5360 [Urine:4560; Stool:800]        PHYSICAL EXAM: General: sitting up in bed, in no distress Neuro:  Aox3, able to converse with provider Cardiovascular:  Rrr, no murmurs Lungs:  No rales or rhonchi Abdomen:  Firm distended abdomen, presence of abdominal hernia  Musculoskeletal: no pitting edema Skin:  jaundiced   ASSESSMENT AND PLAN    #Hemorrhagic shock secondary to upper GI bleed Patient hemodynamically sable. Levophed titrated off 1/4. Hb 9.2. INR pending.  Upper endoscopies done 12/28 and 12/29 showed clotted blood at gastric fundus, no esophageal varices, duodenum normal. Possible that patient has gastric varices as suggested by presence of clotted blood.   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  Decompensation likely in the setting of acute upper GI bleed and acute hepatitis a infection. 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  -continue lactulose 30gh tid -continue rifaximin 5555mbid  -Gastroenterology following -ammonia  23 1/4 -[facility] and they stated that they will place a call to nursing station when bed is available  -titrated off of precedex 1/4 pm -continue olanzapine qhs -[facility] has accepted transfer for possible liver transplant  #Potential Acute Cholecystitis  CT abd showing severe gallbladder distention with a large gallstone in neck of gallbladder. HIDA scan was terminated prematurely by patient. There was poor visualization of blood pooling in liver due to significant cirrhosis.   General surgery recommend empiric iv abx to cover for potential choleycystitis and to defer percutaneous cholecystostomy tube placement till [facility]  -continue zosyn q 8hrs, 1/2>  #AKI with severe hyperkalemia  Patient is s/p CRRT 12/29-1/2. Creatinine stable 2.3-2.5. BUN trending down to 38.  -rfp pending  -nephrology following   #COPD Patient on Symbicort 2 puffs twice daily and Dulera 2 puffs twice daily at home.  -dulera bid  -duoneb tid  -albuterol q2hrs prn  Best Practice / Goals of Care / Disposition.   DVT PROPHYLAXIS:scd SUP: protonix NUTRITION: PO MOBILITY:  GOALS OF CARE: full FAMILY DISCUSSIONS DISPOSITION ICU  LABS  Glucose Recent Labs  Lab 03/10/19 0810 03/10/19 1124 03/10/19 1536 03/10/19 1944 03/10/19 2336 03/11/19 0336  GLUCAP 137* 78 78 127* 98 118*    BMET Recent Labs  Lab 03/09/19 1613 03/10/19 0657 03/10/19 1705  NA 146* 147* 146*  K 2.9* 3.3* 3.4*  CL 114* 112* 115*  CO2 23 22 19*  BUN 46* 38* 32*  CREATININE 2.31* 2.55* 2.15*  GLUCOSE 156* 102* 97    Liver Enzymes Recent Labs  Lab 03/07/19 0508  03/08/19 0400 03/09/19 0350 03/09/19 1613 03/10/19 0657 03/10/19 1705  AST 3,174* 1,390* 859*  --   --   --   ALT 1,509* 860* 683*  --   --   --   ALKPHOS 79 70 75  --   --   --   BILITOT 28.2* 25.2* 27.1*  --   --   --   ALBUMIN 2.5* 2.4* 2.6* 2.2* 2.2* 2.2*    Electrolytes Recent Labs  Lab 03/08/19 0400 03/09/19 0350 03/09/19 1613 03/10/19 0657 03/10/19 1705  CALCIUM 8.2* 8.6* 8.2* 8.2* 8.4*  MG 2.8* 3.2*  --  2.6*  --   PHOS 3.0 3.4 3.0 2.7 3.3    CBC Recent Labs  Lab 03/09/19 0350 03/10/19 0607 03/11/19 0558  WBC 4.2 6.7 11.9*  HGB 8.0* 7.8* 9.2*  HCT 24.5* 23.4* 27.5*  PLT 48* 57* 81*    ABG No results for input(s): PHART, PCO2ART, PO2ART in the last 168 hours.  Coag's Recent Labs  Lab 03/08/19 1442 03/09/19 0350 03/10/19 0857  INR 1.7* 1.8* 1.9*    Sepsis Markers No results for input(s): LATICACIDVEN, PROCALCITON, O2SATVEN in the last 168 hours.  Cardiac Enzymes No results for input(s): TROPONINI, PROBNP in the last 168 hours.  PAST MEDICAL HISTORY :   He  has a past medical history of Arthritis, Bleeds easily [place] CAD (coronary artery disease), Chronic back  pain, COPD (chronic obstructive pulmonary disease) ([place]), Daily headache, Depression, Depression with anxiety, Essential hypertension, GERD (gastroesophageal reflux disease), Hepatitis B, Hepatitis C, HLD (hyperlipidemia), Myocardial infarction ([place]), Pneumonia, Substance abuse ([place]), and Tobacco abuse.  PAST SURGICAL HISTORY:  He  has a past surgical history that includes Foreign Body Removal (Right, 2013); Shoulder arthroscopy (Right, 2010); Shoulder surgery (Right, 2012); Upper gi endoscopy; IR US Guide Vasc Access Right (08/01/2017); IR Fluoro Guide CV Line Right (08/01/2017); Irrigation and debridement shoulder (Left, 07/25/2017); Esophagogastroduodenoscopy (egd) with propofol (N/A, 02/21/2019); and Esophagogastroduodenoscopy (egd) with propofol (N/A, 02/16/2019).  Allergies  Allergen  Reactions  . Acetaminophen Other (See Comments)    Advised not to take d/t liver disease  . Bee Venom Anaphylaxis  . Codeine Hives    No current facility-administered medications on file prior to encounter.   Current Outpatient Medications on File Prior to Encounter  Medication Sig  . amLODipine (NORVASC) 10 MG tablet Take 10 mg by mouth daily.  Marland Kitchen ENSURE PLUS (ENSURE PLUS) LIQD Take 237 mLs by mouth 3 (three) times daily between meals.  . lactulose (CHRONULAC) 10 GM/15ML solution Take 15 mLs (10 g total) by mouth 3 (three) times daily. Titrate in order to achieve 3-4 bowel movements per day.  . mometasone-formoterol (DULERA) 200-5 MCG/ACT AERO Inhale 2 puffs into the lungs 2 (two) times daily.  . propranolol (INDERAL) 40 MG tablet Take 1 tablet (40 mg total) by mouth 2 (two) times daily.  Marland Kitchen entecavir (BARACLUDE) 0.5 MG tablet Take 0.5 mg by mouth daily.  . SYMBICORT 160-4.5 MCG/ACT inhaler Inhale 2 puffs into the lungs 2 (two) times daily.  . Tenofovir Alafenamide Fumarate 25 MG TABS Take 1 tablet (25 mg total) by mouth daily. (Patient not taking: Reported on 02/22/2019)    FAMILY HISTORY:   His family history includes Alcoholism in his father; Hyperlipidemia in his father.  SOCIAL HISTORY:  He  reports that he has been smoking cigarettes. He has a 3.80 pack-year smoking history. He has never used smokeless tobacco. He reports previous alcohol use. He reports current drug use. Drug: Amphetamines.  REVIEW OF SYSTEMS:    Denies headache, chest pain, dyspnea, abdominal pain  Attending Note:  54 year old male with [name] and C presenting with cirrhosis and respiratory failure.  Precedex and levophed d/ced overnight and patient remains confused but appropriate on exam.  I reviewed CXR myself, no acute disease noted.  Discussed with resident.  Liver failure:  - GI following  - No anti-virals  Hepatic encephalopathy:  - Lactulose  - Rifaximin  Hypotension:  - D/C levophed  - D/C  stress dose steroids  AKI:  - No further HD  - Monitor  Will transfer to progressive care and to [facility] service with PCCM off 03/12/19  Patient seen and examined, agree with above note.  I dictated the care and orders written for this patient under my direction.  Rush Farmer, M.D. [facility] Pulmonary/Critical Care Medicine.

## 2019-03-11 NOTE — Progress Notes (Signed)
Subjective: Patient seen and examined at bedside, in presence of his nurse, his sister was also in the room. Denies abdominal pain.  Objective: Vital signs in last 24 hours: Temp:  [97.5 F (36.4 C)-98.5 F (36.9 C)] 98.5 F (36.9 C) (01/05 1100) Pulse Rate:  [62-118] 107 (01/05 1100) Resp:  [16-35] 22 (01/05 1100) BP: (86-158)/(44-135) 138/73 (01/05 1100) SpO2:  [95 %-100 %] 96 % (01/05 1100) Weight:  [79.2 kg] 79.2 kg (01/05 0500) Weight change: -2.1 kg Last BM Date: 03/11/19  PE: Deeply icteric, mild pallor GENERAL: Awake, alert, no asterixis, oriented x3, cooperative and answers appropriately ABDOMEN: Mild distention, soft otherwise, umbilical hernia, nontender, normoactive bowel sounds EXTREMITIES: No pitting edema  Lab Results: Results for orders placed or performed during the hospital encounter of 02/18/2019 (from the past 48 hour(s))  Glucose, capillary     Status: Abnormal   Collection Time: 03/09/19  3:27 PM  Result Value Ref Range   Glucose-Capillary 161 (H) 70 - 99 mg/dL  Renal function panel (daily at 1600)     Status: Abnormal   Collection Time: 03/09/19  4:13 PM  Result Value Ref Range   Sodium 146 (H) 135 - 145 mmol/L   Potassium 2.9 (L) 3.5 - 5.1 mmol/L   Chloride 114 (H) 98 - 111 mmol/L   CO2 23 22 - 32 mmol/L   Glucose, Bld 156 (H) 70 - 99 mg/dL   BUN 46 (H) 6 - 20 mg/dL   Creatinine, Ser 2.31 (H) 0.61 - 1.24 mg/dL   Calcium 8.2 (L) 8.9 - 10.3 mg/dL   Phosphorus 3.0 2.5 - 4.6 mg/dL   Albumin 2.2 (L) 3.5 - 5.0 g/dL   GFR calc non Af Amer 31 (L) >60 mL/min   GFR calc Af Amer 36 (L) >60 mL/min   Anion gap 9 5 - 15    Comment: Performed at [facility], [address], [place], [place] [address]  Glucose, capillary     Status: Abnormal   Collection Time: 03/09/19  8:13 PM  Result Value Ref Range   Glucose-Capillary 102 (H) 70 - 99 mg/dL  Glucose, capillary     Status: None   Collection Time: 03/10/19 12:27 AM  Result Value Ref Range    Glucose-Capillary 89 70 - 99 mg/dL  Glucose, capillary     Status: None   Collection Time: 03/10/19  4:21 AM  Result Value Ref Range   Glucose-Capillary 94 70 - 99 mg/dL  CBC     Status: Abnormal   Collection Time: 03/10/19  6:07 AM  Result Value Ref Range   WBC 6.7 4.0 - 10.5 K/uL   RBC 2.45 (L) 4.22 - 5.81 MIL/uL   Hemoglobin 7.8 (L) 13.0 - 17.0 g/dL   HCT 23.4 (L) 39.0 - 52.0 %   MCV 95.5 80.0 - 100.0 fL   MCH 31.8 26.0 - 34.0 pg   MCHC 33.3 30.0 - 36.0 g/dL   RDW 22.5 (H) 11.5 - 15.5 %   Platelets 57 (L) 150 - 400 K/uL    Comment: REPEATED TO VERIFY Immature Platelet Fraction may be clinically indicated, consider ordering this additional test [id number] CONSISTENT WITH PREVIOUS RESULT    nRBC 0.4 (H) 0.0 - 0.2 %    Comment: Performed at [facility], [place] [address], [place], [place] [address]  Renal function panel (daily at 0500)     Status: Abnormal   Collection Time: 03/10/19  6:57 AM  Result Value Ref Range  Sodium 147 (H) 135 - 145 mmol/L   Potassium 3.3 (L) 3.5 - 5.1 mmol/L   Chloride 112 (H) 98 - 111 mmol/L   CO2 22 22 - 32 mmol/L   Glucose, Bld 102 (H) 70 - 99 mg/dL   BUN 38 (H) 6 - 20 mg/dL   Creatinine, Ser 2.55 (H) 0.61 - 1.24 mg/dL   Calcium 8.2 (L) 8.9 - 10.3 mg/dL   Phosphorus 2.7 2.5 - 4.6 mg/dL   Albumin 2.2 (L) 3.5 - 5.0 g/dL   GFR calc non Af Amer 28 (L) >60 mL/min   GFR calc Af Amer 32 (L) >60 mL/min   Anion gap 13 5 - 15    Comment: Performed at [facility] [address] [place], [place] [address]  Magnesium     Status: Abnormal   Collection Time: 03/10/19  6:57 AM  Result Value Ref Range   Magnesium 2.6 (H) 1.7 - 2.4 mg/dL    Comment: Performed at [facility] [address] [place], [place] [address]  Glucose, capillary     Status: Abnormal   Collection Time: 03/10/19  8:10 AM  Result Value Ref Range   Glucose-Capillary 137 (H) 70 - 99 mg/dL  Protime-INR     Status: Abnormal   Collection Time: 03/10/19  8:57 AM   Result Value Ref Range   Prothrombin Time 21.6 (H) 11.4 - 15.2 seconds   INR 1.9 (H) 0.8 - 1.2    Comment: (NOTE) INR goal varies based on device and disease states. Performed at [facility], [place] [address] [place], [place] [address]   Cortisol     Status: None   Collection Time: 03/10/19  9:04 AM  Result Value Ref Range   Cortisol, Plasma 12.6 ug/dL    Comment: (NOTE) AM    6.7 - 22.6 ug/dL PM   <10.0       ug/dL Performed at [facility] [address] [place], [place] [address]   Ammonia     Status: None   Collection Time: 03/10/19  9:08 AM  Result Value Ref Range   Ammonia 23 9 - 35 umol/L    Comment: Performed at [facility], [place] [address], [place], [place] [address]  Glucose, capillary     Status: None   Collection Time: 03/10/19 11:24 AM  Result Value Ref Range   Glucose-Capillary 78 70 - 99 mg/dL  Glucose, capillary     Status: None   Collection Time: 03/10/19  3:36 PM  Result Value Ref Range   Glucose-Capillary 78 70 - 99 mg/dL  Renal function panel (daily at 1600)     Status: Abnormal   Collection Time: 03/10/19  5:05 PM  Result Value Ref Range   Sodium 146 (H) 135 - 145 mmol/L   Potassium 3.4 (L) 3.5 - 5.1 mmol/L   Chloride 115 (H) 98 - 111 mmol/L   CO2 19 (L) 22 - 32 mmol/L   Glucose, Bld 97 70 - 99 mg/dL   BUN 32 (H) 6 - 20 mg/dL   Creatinine, Ser 2.15 (H) 0.61 - 1.24 mg/dL   Calcium 8.4 (L) 8.9 - 10.3 mg/dL   Phosphorus 3.3 2.5 - 4.6 mg/dL   Albumin 2.2 (L) 3.5 - 5.0 g/dL   GFR calc non Af Amer 34 (L) >60 mL/min   GFR calc Af Amer 40 (L) >60 mL/min   Anion gap 12 5 - 15    Comment: Performed  at [facility], [place] [address], [place], [place] [address]  Glucose, capillary     Status: Abnormal   Collection Time: 03/10/19  7:44 PM  Result Value Ref Range   Glucose-Capillary 127 (H) 70 - 99 mg/dL  Glucose, capillary     Status: None   Collection Time: 03/10/19 11:36 PM  Result Value Ref Range   Glucose-Capillary 98 70 -  99 mg/dL  Glucose, capillary     Status: Abnormal   Collection Time: 03/11/19  3:36 AM  Result Value Ref Range   Glucose-Capillary 118 (H) 70 - 99 mg/dL  Ammonia     Status: Abnormal   Collection Time: 03/11/19  5:50 AM  Result Value Ref Range   Ammonia 40 (H) 9 - 35 umol/L    Comment: Performed at [facility], [place] [address], [place], [place] [address]  Magnesium     Status: None   Collection Time: 03/11/19  5:58 AM  Result Value Ref Range   Magnesium 2.4 1.7 - 2.4 mg/dL    Comment: Performed at [facility] [address] [place], [place] [address]  CBC     Status: Abnormal   Collection Time: 03/11/19  5:58 AM  Result Value Ref Range   WBC 11.9 (H) 4.0 - 10.5 K/uL   RBC 2.85 (L) 4.22 - 5.81 MIL/uL   Hemoglobin 9.2 (L) 13.0 - 17.0 g/dL   HCT 27.5 (L) 39.0 - 52.0 %   MCV 96.5 80.0 - 100.0 fL   MCH 32.3 26.0 - 34.0 pg   MCHC 33.5 30.0 - 36.0 g/dL   RDW 23.5 (H) 11.5 - 15.5 %   Platelets 81 (L) 150 - 400 K/uL    Comment: Immature Platelet Fraction may be clinically indicated, consider ordering this additional test [id number] CONSISTENT WITH PREVIOUS RESULT    nRBC 0.4 (H) 0.0 - 0.2 %    Comment: Performed at [facility], [place] [address] [place], [place] [address]  Comprehensive metabolic panel     Status: Abnormal   Collection Time: 03/11/19  5:58 AM  Result Value Ref Range   Sodium 144 135 - 145 mmol/L   Potassium 3.3 (L) 3.5 - 5.1 mmol/L   Chloride 114 (H) 98 - 111 mmol/L   CO2 20 (L) 22 - 32 mmol/L   Glucose, Bld 140 (H) 70 - 99 mg/dL   BUN 25 (H) 6 - 20 mg/dL   Creatinine, Ser 1.87 (H) 0.61 - 1.24 mg/dL   Calcium 8.5 (L) 8.9 - 10.3 mg/dL   Total Protein 7.6 6.5 - 8.1 g/dL   Albumin 2.4 (L) 3.5 - 5.0 g/dL   AST 290 (H) 15 - 41 U/L   ALT 450 (H) 0 - 44 U/L   Alkaline Phosphatase 78 38 - 126 U/L   Total Bilirubin 35.2 (HH) 0.3 - 1.2 mg/dL    Comment: RESULTS CONFIRMED BY MANUAL DILUTION CRITICAL VALUE NOTED.  VALUE IS CONSISTENT WITH PREVIOUSLY  REPORTED AND CALLED VALUE.    GFR calc non Af Amer 40 (L) >60 mL/min   GFR calc Af Amer 47 (L) >60 mL/min   Anion gap 10 5 - 15    Comment: Performed at [facility] [address], [place], [place] [address]  Phosphorus AM     Status: None   Collection Time: 03/11/19  5:58 AM  Result Value Ref Range   Phosphorus 3.8 2.5 - 4.6 mg/dL    Comment: Performed  at [facility], [place] [address] [place], [place] [address]  Glucose, capillary     Status: Abnormal   Collection Time: 03/11/19  8:01 AM  Result Value Ref Range   Glucose-Capillary 114 (H) 70 - 99 mg/dL  Protime-INR     Status: Abnormal   Collection Time: 03/11/19  8:02 AM  Result Value Ref Range   Prothrombin Time 21.0 (H) 11.4 - 15.2 seconds   INR 1.8 (H) 0.8 - 1.2    Comment: (NOTE) INR goal varies based on device and disease states. Performed at [facility], [place] [address] [place], [place] [address]   Glucose, capillary     Status: Abnormal   Collection Time: 03/11/19 12:09 PM  Result Value Ref Range   Glucose-Capillary 100 (H) 70 - 99 mg/dL    Studies/Results: No results found.  Medications: I have reviewed the patient's current medications.  Assessment: Decompensated cirrhosis of liver-history of hepatitis B and hepatitis C, seems to have cleared both hepatitis B and hepatitis C(HBV DNA not detected and HCV RNA not detected on 02/26/2019) and recently had acute hepatitis A infection(HAIgM positive on 02/25/2019).  MELD Na 32 today Encephalopathy-improved Coagulopathy-improved  Anemia, hemoglobin 9.2 today without transfusion, EGD on 03/05/2019 and 02/19/2019 did not show any esophageal varices however blood clot was noted in the fundus and underlying gastric varices cannot be ruled out.  Severe gallbladder distention with large gallstone in the neck of gallbladder Patient placed on empiric IV antibiotics to cover for possible cholecystitis and to hold off percutaneous cholecystostomy tube  placement  T bili elevated to 35.2, however AST and ALT are trending down compatible with shock liver/ischemic hepatitis   Plan: Continue regular diet, continue lactulose 30 g 3 times daily and Xifaxan 550 mg twice daily. We will start patient on nonselective beta-blocker propanolol 10 mg twice daily, be titrated to keep a heart rate between 55 to 60/min, as long as systolic blood pressure is above 90 mmHg. Patient will benefit from a repeat endoscopy during this hospitalization as adequate examination of the gastric fundus has not been performed and we have not ruled out gastric varices.  Ronnette Juniper, MD 03/11/2019, 12:49 PM

## 2019-03-11 NOTE — Progress Notes (Signed)
Spoke w/ pts step-dad to provide updates. Step-dad appreciative.

## 2019-03-12 DIAGNOSIS — I2510 Atherosclerotic heart disease of native coronary artery without angina pectoris: Secondary | ICD-10-CM

## 2019-03-12 DIAGNOSIS — J439 Emphysema, unspecified: Secondary | ICD-10-CM

## 2019-03-12 DIAGNOSIS — I2583 Coronary atherosclerosis due to lipid rich plaque: Secondary | ICD-10-CM

## 2019-03-12 LAB — COMPREHENSIVE METABOLIC PANEL
ALT: 318 U/L — ABNORMAL HIGH (ref 0–44)
AST: 156 U/L — ABNORMAL HIGH (ref 15–41)
Albumin: 2.2 g/dL — ABNORMAL LOW (ref 3.5–5.0)
Alkaline Phosphatase: 79 U/L (ref 38–126)
Anion gap: 12 (ref 5–15)
BUN: 31 mg/dL — ABNORMAL HIGH (ref 6–20)
CO2: 20 mmol/L — ABNORMAL LOW (ref 22–32)
Calcium: 8.5 mg/dL — ABNORMAL LOW (ref 8.9–10.3)
Chloride: 114 mmol/L — ABNORMAL HIGH (ref 98–111)
Creatinine, Ser: 1.95 mg/dL — ABNORMAL HIGH (ref 0.61–1.24)
GFR calc Af Amer: 45 mL/min — ABNORMAL LOW (ref >60)
GFR calc non Af Amer: 38 mL/min — ABNORMAL LOW (ref >60)
Glucose, Bld: 152 mg/dL — ABNORMAL HIGH (ref 70–99)
Potassium: 3.4 mmol/L — ABNORMAL LOW (ref 3.5–5.1)
Sodium: 146 mmol/L — ABNORMAL HIGH (ref 135–145)
Total Bilirubin: 29.4 mg/dL (ref 0.3–1.2)
Total Protein: 7.4 g/dL (ref 6.5–8.1)

## 2019-03-12 LAB — AMMONIA: Ammonia: 65 umol/L — ABNORMAL HIGH (ref 9–35)

## 2019-03-12 LAB — PROTIME-INR
INR: 1.8 — ABNORMAL HIGH (ref 0.8–1.2)
Prothrombin Time: 20.8 seconds — ABNORMAL HIGH (ref 11.4–15.2)

## 2019-03-12 LAB — GLUCOSE, CAPILLARY
Glucose-Capillary: 93 mg/dL (ref 70–99)
Glucose-Capillary: 75 mg/dL (ref 70–99)
Glucose-Capillary: 95 mg/dL (ref 70–99)
Glucose-Capillary: 87 mg/dL (ref 70–99)

## 2019-03-12 LAB — CBC
HCT: 31.2 % — ABNORMAL LOW (ref 39.0–52.0)
Hemoglobin: 9.8 g/dL — ABNORMAL LOW (ref 13.0–17.0)
MCH: 31.8 pg (ref 26.0–34.0)
MCHC: 31.4 g/dL (ref 30.0–36.0)
MCV: 101.3 fL — ABNORMAL HIGH (ref 80.0–100.0)
Platelets: 147 10*3/uL — ABNORMAL LOW (ref 150–400)
RBC: 3.08 MIL/uL — ABNORMAL LOW (ref 4.22–5.81)
RDW: 25.5 % — ABNORMAL HIGH (ref 11.5–15.5)
WBC: 20.7 10*3/uL — ABNORMAL HIGH (ref 4.0–10.5)
nRBC: 0.4 % — ABNORMAL HIGH (ref 0.0–0.2)

## 2019-03-12 LAB — MAGNESIUM: Magnesium: 2.4 mg/dL (ref 1.7–2.4)

## 2019-03-12 LAB — PHOSPHORUS: Phosphorus: 3.7 mg/dL (ref 2.5–4.6)

## 2019-03-12 MED ORDER — PROPRANOLOL HCL 20 MG PO TABS
20.0000 mg | ORAL_TABLET | Freq: Two times a day (BID) | ORAL | Status: DC
  Administered 2019-03-12 – 2019-03-13 (×4): 20 mg via ORAL
  Filled 2019-03-12 (×5): qty 1

## 2019-03-12 MED ORDER — ENSURE ENLIVE PO LIQD
237.0000 mL | Freq: Two times a day (BID) | ORAL | Status: DC
  Administered 2019-03-12 – 2019-03-13 (×3): 237 mL via ORAL

## 2019-03-12 MED ORDER — ADULT MULTIVITAMIN W/MINERALS CH
1.0000 | ORAL_TABLET | Freq: Every day | ORAL | Status: DC
  Administered 2019-03-12 – 2019-03-13 (×2): 1 via ORAL
  Filled 2019-03-12: qty 1

## 2019-03-12 NOTE — Progress Notes (Signed)
Physical Therapy Treatment Patient Details Name: Glenn Alvarado MRN: [id number] DOB: Jul 20, 1965 Today's Date: 03/12/2019    History of Present Illness 54 year old male with history of hepatitis related hepatic cirrhosis.  Admitted 12/27 with nausea vomiting and abdominal pain.  In the p.m. hours of 12/28 he developed a large volume hematemesis and circulatory shock--acute decompensated cirrhosis.PHMx: hepatitis B, C, hepatic cirrhosis, COPD, CAD, and substance abuse.  He was recently admitted to [facility] for acute hepatitis A.  Course was complicated by hepatic encephalopathy and he was discharged home on lactulose on 12/26.    PT Comments    Patient seen for mobility progression. Pt in bed upon arrival and agreeable to participate in therapy. Pt tangential throughout session. This session focused on gait training without AD. Pt tolerated increased activity and requires min A due to impaired balance and LOB X 2 with horizontal head turns. Continue to progress as tolerated.     Follow Up Recommendations  Supervision/Assistance - 24 hour;Home health PT     Equipment Recommendations  Rolling walker with 5" wheels    Recommendations for Other Services       Precautions / Restrictions Precautions Precautions: Fall Restrictions Weight Bearing Restrictions: No    Mobility  Bed Mobility Overal bed mobility: Independent Bed Mobility: Supine to Sit           General bed mobility comments: needs supervision for safety as pt is impulsive to get up once sitting EOB but independent with bed mobility  Transfers Overall transfer level: Needs assistance   Transfers: Sit to/from Stand Sit to Stand: Min guard         General transfer comment: min guard for safety  Ambulation/Gait Ambulation/Gait assistance: Min assist Gait Distance (Feet): 300 Feet Assistive device: (assist at trunk with gait belt) Gait Pattern/deviations: Step-through pattern;Decreased stride  length;Drifts right/left;Narrow base of support Gait velocity: decr   General Gait Details: cues for increased gait speed however pt demonstrated no change; assistance required for balance; pt with drifting R/L, LOB X2 with horizontal head turns, and frequently near scissoring; cues to navigate hallway as pt running into objects at times    Stairs             Wheelchair Mobility    Modified Rankin (Stroke Patients Only)       Balance Overall balance assessment: Needs assistance Sitting-balance support: No upper extremity supported;Feet supported Sitting balance-Leahy Scale: Good     Standing balance support: During functional activity Standing balance-Leahy Scale: Poor                              Cognition Arousal/Alertness: Awake/alert Behavior During Therapy: Impulsive Overall Cognitive Status: Impaired/Different from baseline Area of Impairment: Memory;Following commands;Safety/judgement;Awareness;Problem solving                     Memory: Decreased short-term memory Following Commands: Follows one step commands consistently Safety/Judgement: Decreased awareness of safety;Decreased awareness of deficits Awareness: Intellectual Problem Solving: Requires verbal cues General Comments: tangential      Exercises      General Comments        Pertinent Vitals/Pain Pain Assessment: No/denies pain    Home Living                      Prior Function            PT Goals (current goals can now be found  in the care plan section) Acute Rehab PT Goals Patient Stated Goal: go home and see my girls Progress towards PT goals: Progressing toward goals    Frequency    Min 3X/week      PT Plan Current plan remains appropriate    Co-evaluation              AM-PAC PT "6 Clicks" Mobility   Outcome Measure  Help needed turning from your back to your side while in a flat bed without using bedrails?: None Help needed  moving from lying on your back to sitting on the side of a flat bed without using bedrails?: None Help needed moving to and from a bed to a chair (including a wheelchair)?: A Little Help needed standing up from a chair using your arms (e.g., wheelchair or bedside chair)?: A Little Help needed to walk in hospital room?: A Little Help needed climbing 3-5 steps with a railing? : A Little 6 Click Score: 20    End of Session Equipment Utilized During Treatment: Gait belt Activity Tolerance: Patient tolerated treatment well Patient left: with call bell/phone within reach;in chair;with chair alarm set Nurse Communication: Mobility status PT Visit Diagnosis: Unsteadiness on feet (R26.81);Muscle weakness (generalized) (M62.81)     Time: 2706-2376 PT Time Calculation (min) (ACUTE ONLY): 22 min  Charges:  $Gait Training: 8-22 mins                     Earney Navy, PTA Acute Rehabilitation Services Pager: [phone] Office: [phone]     Darliss Cheney 03/12/2019, 11:00 AM

## 2019-03-12 NOTE — Progress Notes (Signed)
CRITICAL VALUE ALERT  Critical Value:  Total bilirubin 29.4  Date & Time Notied: 03/12/19 @ 3112  Provider Notified: Dr. Cordelia Poche  Orders Received/Actions taken: Via Amion and MD called back

## 2019-03-12 NOTE — Progress Notes (Signed)
During morning medication administration, patient refused scheduled dose of lactulose. Patient states "I might be able to take one cup, but not all three". Lonny Prude, MD notified and made aware.

## 2019-03-12 NOTE — Care Management Important Message (Signed)
Important Message  Patient Details  Name: Glenn Alvarado MRN: [id number] Date of Birth: February 06, 1966   Medicare Important Message Given:  Yes     Shelda Altes 03/12/2019, 12:27 PM

## 2019-03-12 NOTE — Progress Notes (Signed)
Nutrition Follow-up  DOCUMENTATION CODES:   Not applicable  INTERVENTION:   -MVI with minerals daily -30 ml Prostat BID, each supplement provides 100 kcals and 15 grams protein -Ensure Enlive po BID, each supplement provides 350 kcal and 20 grams of protein -Snacks between meals  NUTRITION DIAGNOSIS:   Increased nutrient needs related to chronic illness(decompensated liver disease) as evidenced by estimated needs(MELD score 30's. Pending transfer to [facility]).  Ongoing  GOAL:   Patient will meet greater than or equal to 90% of their needs  Progressing   MONITOR:   PO intake, Skin, Labs, I & O's, Weight trends(acceptance of snacks)  REASON FOR ASSESSMENT:   Consult Enteral/tube feeding initiation and management  ASSESSMENT:   54 yo male admitted with abdominal pain with N/V, shock, hematemesis. PMH includes hepatic cirrhosis, hepatitis B, hepatitis C, COPD, CAD, substance abuse. Recent admission for acute hepatitis and decompensated hepatic cirrhosis. Discharged on 12/26, readmitted 12/27.  1/4-patient extubated and off CRRT 03/07/19. Patient pending transfer to [facility]. GI note IR-percutaneous cholecystostomy deferred due to INR and platelet levels.   Reviewed I/O's: -1.6 L x 24 hours and -7.8 L since admission  UOP: 2.2 L x 24 hours  Attempted to speak with pt via phone, however, no answer. Per chart review, pt has been confused and requesting to sign out AMA. However, pt not competent enough per DM.   Pt has been resistant to take lactulose.   Noted pt with poor meal completion; PO: 25-75%. RD will trial oral nutrition supplements to assist with meeting needs.   Medications reviewed and include lactulose.   Pt awaiting transfer to [facility], pending bed availability.   Labs reviewed: Na: 146, K: 3.4, CBGS: 93-101.   Diet Order:   Diet Order            Diet regular Room service appropriate? Yes; Fluid consistency: Thin  Diet effective now              EDUCATION  NEEDS:   Not appropriate for education at this time  Skin:  Skin Assessment: Skin Integrity Issues: Skin Integrity Issues:: Other (Comment) Other: MASD bilateral buttocks  Last BM:  03/10/18  Height:   Ht Readings from Last 1 Encounters:  03/11/19 5' 8"  (1.727 m)    Weight:   Wt Readings from Last 1 Encounters:  03/12/19 77.1 kg    Ideal Body Weight:  70 kg  BMI:  Body mass index is 25.84 kg/m.  Estimated Nutritional Needs:   Kcal:  2300-2500  Protein:  115-130 grams  Fluid:  > 2.3 L    Yanis Larin A. Jimmye Norman, RD, LDN, [place] Registered Dietitian II Certified Diabetes Care and Education Specialist Pager: [phone] After hours Pager: [phone]

## 2019-03-12 NOTE — Progress Notes (Signed)
Nurse spoke with Trenton Gammon (patient's stepfather) regarding plan of care; Percell Miller updated that there are currently no discharge orders in place. Edward requests to receive a call by MD once patient is discharged.

## 2019-03-12 NOTE — Progress Notes (Signed)
Subjective: Patient wants to go home today and states he has a son on papers to buy a house.  Although he is awake and oriented x3, he seems delirious and has mild asterixis and tremors.  Objective: Vital signs in last 24 hours: Temp:  [97.7 F (36.5 C)-99.2 F (37.3 C)] 98.4 F (36.9 C) (01/06 0747) Pulse Rate:  [44-223] 88 (01/06 0747) Resp:  [10-40] 18 (01/06 0747) BP: (110-161)/(54-123) 134/74 (01/06 0747) SpO2:  [65 %-100 %] 97 % (01/06 0808) Weight:  [77.1 kg-79.5 kg] 77.1 kg (01/06 0128) Weight change: 0.3 kg Last BM Date: 03/11/19(Flexiseal)  PE: Deep icterus, mild pallor, he is comfortable GENERAL: Oriented x3, awake ABDOMEN: Prominent umbilical hernia, soft abdomen, nontender, no evidence of underlying ascites, normal active bowel sounds EXTREMITIES: no edema  Lab Results: Results for orders placed or performed during the hospital encounter of 02/06/2019 (from the past 48 hour(s))  Ammonia     Status: None   Collection Time: 03/10/19  9:08 AM  Result Value Ref Range   Ammonia 23 9 - 35 umol/L    Comment: Performed at [facility], [address], [place], [place] [address]  Glucose, capillary     Status: None   Collection Time: 03/10/19 11:24 AM  Result Value Ref Range   Glucose-Capillary 78 70 - 99 mg/dL  Glucose, capillary     Status: None   Collection Time: 03/10/19  3:36 PM  Result Value Ref Range   Glucose-Capillary 78 70 - 99 mg/dL  Renal function panel (daily at 1600)     Status: Abnormal   Collection Time: 03/10/19  5:05 PM  Result Value Ref Range   Sodium 146 (H) 135 - 145 mmol/L   Potassium 3.4 (L) 3.5 - 5.1 mmol/L   Chloride 115 (H) 98 - 111 mmol/L   CO2 19 (L) 22 - 32 mmol/L   Glucose, Bld 97 70 - 99 mg/dL   BUN 32 (H) 6 - 20 mg/dL   Creatinine, Ser 2.15 (H) 0.61 - 1.24 mg/dL   Calcium 8.4 (L) 8.9 - 10.3 mg/dL   Phosphorus 3.3 2.5 - 4.6 mg/dL   Albumin 2.2 (L) 3.5 - 5.0 g/dL   GFR calc non Af Amer 34 (L) >60 mL/min   GFR calc Af Amer 40 (L)  >60 mL/min   Anion gap 12 5 - 15    Comment: Performed at [facility], [address] [place], [place] [address]  Glucose, capillary     Status: Abnormal   Collection Time: 03/10/19  7:44 PM  Result Value Ref Range   Glucose-Capillary 127 (H) 70 - 99 mg/dL  Glucose, capillary     Status: None   Collection Time: 03/10/19 11:36 PM  Result Value Ref Range   Glucose-Capillary 98 70 - 99 mg/dL  Glucose, capillary     Status: Abnormal   Collection Time: 03/11/19  3:36 AM  Result Value Ref Range   Glucose-Capillary 118 (H) 70 - 99 mg/dL  Ammonia     Status: Abnormal   Collection Time: 03/11/19  5:50 AM  Result Value Ref Range   Ammonia 40 (H) 9 - 35 umol/L    Comment: Performed at [facility], [place] [address] [place], [place] [address]  Magnesium     Status: None   Collection Time: 03/11/19  5:58 AM  Result Value Ref Range   Magnesium 2.4 1.7 - 2.4 mg/dL    Comment: Performed at [facility],  [address] [place], [place] [address]  CBC     Status: Abnormal   Collection Time: 03/11/19  5:58 AM  Result Value Ref Range   WBC 11.9 (H) 4.0 - 10.5 K/uL   RBC 2.85 (L) 4.22 - 5.81 MIL/uL   Hemoglobin 9.2 (L) 13.0 - 17.0 g/dL   HCT 27.5 (L) 39.0 - 52.0 %   MCV 96.5 80.0 - 100.0 fL   MCH 32.3 26.0 - 34.0 pg   MCHC 33.5 30.0 - 36.0 g/dL   RDW 23.5 (H) 11.5 - 15.5 %   Platelets 81 (L) 150 - 400 K/uL    Comment: Immature Platelet Fraction may be clinically indicated, consider ordering this additional test [id number] CONSISTENT WITH PREVIOUS RESULT    nRBC 0.4 (H) 0.0 - 0.2 %    Comment: Performed at [facility], [place] [address] [place], [place] [address]  Comprehensive metabolic panel     Status: Abnormal   Collection Time: 03/11/19  5:58 AM  Result Value Ref Range   Sodium 144 135 - 145 mmol/L   Potassium 3.3 (L) 3.5 - 5.1 mmol/L   Chloride 114 (H) 98 - 111 mmol/L   CO2 20 (L) 22 - 32 mmol/L   Glucose, Bld 140 (H) 70 - 99 mg/dL   BUN 25 (H) 6 - 20 mg/dL    Creatinine, Ser 1.87 (H) 0.61 - 1.24 mg/dL   Calcium 8.5 (L) 8.9 - 10.3 mg/dL   Total Protein 7.6 6.5 - 8.1 g/dL   Albumin 2.4 (L) 3.5 - 5.0 g/dL   AST 290 (H) 15 - 41 U/L   ALT 450 (H) 0 - 44 U/L   Alkaline Phosphatase 78 38 - 126 U/L   Total Bilirubin 35.2 (HH) 0.3 - 1.2 mg/dL    Comment: RESULTS CONFIRMED BY MANUAL DILUTION CRITICAL VALUE NOTED.  VALUE IS CONSISTENT WITH PREVIOUSLY REPORTED AND CALLED VALUE.    GFR calc non Af Amer 40 (L) >60 mL/min   GFR calc Af Amer 47 (L) >60 mL/min   Anion gap 10 5 - 15    Comment: Performed at [facility] [address], [place], [place] [address]  Phosphorus AM     Status: None   Collection Time: 03/11/19  5:58 AM  Result Value Ref Range   Phosphorus 3.8 2.5 - 4.6 mg/dL    Comment: Performed at [facility], [place] [address] [place], [place] [address]  Glucose, capillary     Status: Abnormal   Collection Time: 03/11/19  8:01 AM  Result Value Ref Range   Glucose-Capillary 114 (H) 70 - 99 mg/dL  Protime-INR     Status: Abnormal   Collection Time: 03/11/19  8:02 AM  Result Value Ref Range   Prothrombin Time 21.0 (H) 11.4 - 15.2 seconds   INR 1.8 (H) 0.8 - 1.2    Comment: (NOTE) INR goal varies based on device and disease states. Performed at [facility], [place] [address], [place], [place] [address]   Glucose, capillary     Status: Abnormal   Collection Time: 03/11/19 12:09 PM  Result Value Ref Range   Glucose-Capillary 100 (H) 70 - 99 mg/dL  Glucose, capillary     Status: Abnormal   Collection Time: 03/11/19 11:54 PM  Result Value Ref Range   Glucose-Capillary 101 (H) 70 - 99 mg/dL  Glucose, capillary     Status: None   Collection Time: 03/12/19  5:20 AM  Result Value  Ref Range   Glucose-Capillary 93 70 - 99 mg/dL   Comment 1 Notify RN    Comment 2 Document in Chart   Magnesium     Status: None   Collection Time: 03/12/19  6:02 AM  Result Value Ref Range   Magnesium 2.4 1.7 - 2.4 mg/dL    Comment:  Performed at [facility] [address] [place], [place] [address]  Ammonia     Status: Abnormal   Collection Time: 03/12/19  6:02 AM  Result Value Ref Range   Ammonia 65 (H) 9 - 35 umol/L    Comment: Performed at [facility], [place] [address] [place], [place] [address]  Phosphorus     Status: None   Collection Time: 03/12/19  6:02 AM  Result Value Ref Range   Phosphorus 3.7 2.5 - 4.6 mg/dL    Comment: Performed at [facility] [address] [place], [place] [address]  Protime-INR     Status: Abnormal   Collection Time: 03/12/19  6:02 AM  Result Value Ref Range   Prothrombin Time 20.8 (H) 11.4 - 15.2 seconds   INR 1.8 (H) 0.8 - 1.2    Comment: (NOTE) INR goal varies based on device and disease states. Performed at [facility], [place] [address] [place], [place] [address]   Comprehensive metabolic panel     Status: Abnormal   Collection Time: 03/12/19  6:02 AM  Result Value Ref Range   Sodium 146 (H) 135 - 145 mmol/L   Potassium 3.4 (L) 3.5 - 5.1 mmol/L   Chloride 114 (H) 98 - 111 mmol/L   CO2 20 (L) 22 - 32 mmol/L   Glucose, Bld 152 (H) 70 - 99 mg/dL   BUN 31 (H) 6 - 20 mg/dL   Creatinine, Ser 1.95 (H) 0.61 - 1.24 mg/dL   Calcium 8.5 (L) 8.9 - 10.3 mg/dL   Total Protein 7.4 6.5 - 8.1 g/dL   Albumin 2.2 (L) 3.5 - 5.0 g/dL   AST 156 (H) 15 - 41 U/L   ALT 318 (H) 0 - 44 U/L   Alkaline Phosphatase 79 38 - 126 U/L   Total Bilirubin 29.4 (HH) 0.3 - 1.2 mg/dL    Comment: NO VISIBLE HEMOLYSIS CRITICAL RESULT CALLED TO, READ BACK BY AND VERIFIED WITH: ODEMELAM,F RN @0705  ON [id number] BY FLEMINGS    GFR calc non Af Amer 38 (L) >60 mL/min   GFR calc Af Amer 45 (L) >60 mL/min   Anion gap 12 5 - 15    Comment: Performed at [facility], [place] [address], [place], [place] [address]    Studies/Results: No results found.  Medications: I have reviewed the patient's current medications.  Assessment: Decompensated liver cirrhosis-recent acute  hepatitis A infection(HAV IgM positive on 01/26/2019), history of hepatitis B and hepatitis C, however recent viral load negative for both on 02/26/2019.  Ischemic hepatitis and shock liver-AST and ALT trending down. Total bilirubin remains elevated  GI bleed-unclear source, no esophageal varices noted, large amount of blood noted in cardia and fundus, underlying gastric varices cannot be ruled out  Distended gallbladder, gallbladder wall thickening, gallstones-did empirically with IV antibiotics.  Not a candidate for IR guided cholecystostomy or cholecystectomy after evaluation by IR and surgical team.  Hepatic encephalopathy-continued on oral lactulose and Xifaxan  MELDNa of 32 today  Has not had CRRT in a few days, nephrology has signed off. Urine output documented at 2200 mL in  the last 24 hours.  Plan: Will increase propanolol to 20 mg twice daily as his heart rate and systolic blood pressure will allow this at the moment.  I spoke with Dr.Muir(Hepatologist at [facility]) who has accepted the patient for transfer as he continues to have elevated MELD score in 30s, driven mainly by elevated bilirubin and elevated creatinine. We do not feel that the numbers will improve enough for him to be discharged and seen as an outpatient for liver transplant evaluation. He agreed with conservative management with IV antibiotics for gallbladder wall thickening. He recommended continuing supportive management. Patient will need a repeat endoscopy as both endoscopies did not reveal source of upper GI bleed due to presence of blood clot in the cardia and fundus. This will likely be done at [facility] once patient is transferred.  Discussed the same with patient's hospitalist Dr. Lonny Prude.  Patient is not competent enough to sign out AMA and is definitely not clinically stable to be discharged at this point.    Ronnette Juniper, MD 03/12/2019, 9:07 AM

## 2019-03-12 NOTE — Progress Notes (Signed)
Patient CBG is 75, patient denies dizziness, light headedness, sweating, or shaking. Lonny Prude, MD paged.

## 2019-03-12 NOTE — Progress Notes (Signed)
PROGRESS NOTE    Dawaun Brancato  ZOX:[id number] DOB: 02/17/1966 DOA: 02/07/2019 PCP: Patient, No Pcp Per   Brief Narrative: Glenn Alvarado is a 54 y.o. male with hepatitis B, C, COPD, hepatic cirrhosis, substance use who presents for treatment of acute decompensated cirrhosis.  Patient was initially managed in ICU for hemorrhagic shock requiring intubation.  Patient underwent CRRT for acute kidney injury with associated severe hyperkalemia.  GI consulted and is recommending transfer to [facility] for possible liver transplant.   Assessment & Plan:   Principal Problem:   Hepatic encephalopathy ([name]) Active Problems:   CAD (coronary artery disease)   Essential hypertension   COPD (chronic obstructive pulmonary disease) ([name])   Acute hepatitis   Decompensated hepatic cirrhosis ([name])   Acute encephalopathy   Cholelithiasis   Cholecystitis   Metabolic acidosis   Decompensated liver cirrhosis Alcoholic steatohepatitis/NASH Patient initially required intubation and management ICU for hemorrhagic shock.  Patient transition off of pressors.  GI consulted and is recommending transfer to [facility] which is in process.  Associated ischemic hepatitis and shock liver with peak AST/ALT of 6497/1998 respectively. MELDNa of 32. Recent hepatitis A infection. History of Hepatitis B and C. INR of 1.8, bilirubin of 29.4, AST/ALT of 156/318, ammonia of 65 -GI recommendations: Continue lactulose 30 g 3 times daily, rifaximin 550 mg twice daily  GI Bleeding GI consulted.  Unclear source with no evidence of esophageal varices, however this cannot be ruled out.  Patient with large amounts of blood noted in the cardia and fundus from recent EGD on 12/29. -Continue Propranolol -Watch for recurrent bleeding -Daily CBC  Acute respiratory failure Patient required intubation from 12/28 until 12/31. Resolved.  Hypoglycemia Required D10 IV fluids which have now been discontinued. Tolerating oral diet.  Acute  kidney injury on CKD stage III Patient required CRRT while in the ICU which is now discontinued. Creatinine of 1.95 and stable.  Hypernatremia Mild -BMP in AM  Distended gallbladder Concern for possible cholecystitis. Patient treated with Ceftriaxone initially with transition to Zosyn. Not a candidate for IR guided cholecystostomy/cholecystectomy per IR and general surgery  Hepatic encephalopathy Patient appears improved but still with some confusion -Continue Xifaxan and lactulose  Leukocytosis Acutely elevated in setting of hydrocortisone. -Daily CBC  COPD -Continue Dulera, Duoneb  Essential hypertension On amlodipine as an outpatient; held.   DVT prophylaxis: SCDs Code Status:   Code Status: Full Code Family Communication: Stepfather on telephone Disposition Plan: Discharge to [facility] when bed is available   Consultants:   PCCM  Gastroenterology  Interventional radiology  General surgery  Procedures:   12/29: Transthoracic Echocardiogram  Findings:      The examined esophagus was normal.      Diffuse moderately erythematous mucosa was found in the stomach that       could be seen. Estimated blood loss: none.      Clotted blood was found in the gastric fundus, in the gastric body and       on the greater curvature of the stomach. Large amount of old blood and       clot prevented a good view and it was so thick it could not be suctioned.      The examined duodenum was normal. Impression:       - Normal esophagus.                           - Erythematous mucosa in the stomach.                           -  Clotted blood in the greater curvature of the                            stomach, in the gastric fundus and in the gastric                            body.                           - Normal examined duodenum.                           - No specimens collected. Recommendation:           - NPO.                           - Continue present  medications.                           - Continue supportive care. PPI, Octreotide.                            Transfuse as needed. Follow clinically.  Antimicrobials:  Ceftriaxone  Zosyn    Subjective: Patient is concerned because he needs to be out of the hospital to close on a house for his children. He also reports not wanting to take lactulose three times per day because he is having frequent stools.  Objective: Vitals:   03/12/19 0747 03/12/19 0808 03/12/19 0951 03/12/19 1100  BP: 134/74  (!) 141/85 (!) 142/72  Pulse: 88  82 73  Resp: 18  16   Temp: 98.4 F (36.9 C)  98 F (36.7 C)   TempSrc: Oral  Oral   SpO2: 94% 97% 95%   Weight:      Height:        Intake/Output Summary (Last 24 hours) at 03/12/2019 1501 Last data filed at 03/12/2019 1300 Gross per 24 hour  Intake 660 ml  Output 2450 ml  Net -1790 ml   Filed Weights   03/11/19 1319 03/12/19 0053 03/12/19 0128  Weight: 79.5 kg 79.2 kg 77.1 kg    Examination:  General exam: Appears calm and comfortable Eyes: scleral icterus  Respiratory system: Clear to auscultation. Respiratory effort normal. Cardiovascular system: S1 & S2 heard, RRR. No murmurs, rubs, gallops or clicks. Gastrointestinal system: Abdomen is distended, soft and nontender. Reducible ventral hernia. Normal bowel sounds heard. Central nervous system: Alert and oriented to person, place, and time. No focal neurological deficits. Extremities: No edema. No calf tenderness Skin: No cyanosis. No rashes. Jaundice. Psychiatry: Judgement and insight appear impaired. Does not appear to have capacity at this time.    Data Reviewed: I have personally reviewed following labs and imaging studies  CBC: Recent Labs  Lab 03/08/19 0400 03/09/19 0350 03/10/19 0607 03/11/19 0558 03/12/19 0602  WBC 3.8* 4.2 6.7 11.9* 20.7*  HGB 7.3* 8.0* 7.8* 9.2* 9.8*  HCT 22.6* 24.5* 23.4* 27.5* 31.2*  MCV 95.4 96.8 95.5 96.5 101.3*  PLT 44* 48* 57* 81* 147*    Basic Metabolic Panel: Recent Labs  Lab 03/08/19 0400 03/09/19 0350 03/09/19 1613 03/10/19 0657 03/10/19 1705 03/11/19 0558 03/12/19 0602  NA 143 147* 146* 147* 146* 144  146*  K 3.7 3.4* 2.9* 3.3* 3.4* 3.3* 3.4*  CL 107 112* 114* 112* 115* 114* 114*  CO2 26 23 23 22  19* 20* 20*  GLUCOSE 84 96 156* 102* 97 140* 152*  BUN 32* 44* 46* 38* 32* 25* 31*  CREATININE 1.85* 2.37* 2.31* 2.55* 2.15* 1.87* 1.95*  CALCIUM 8.2* 8.6* 8.2* 8.2* 8.4* 8.5* 8.5*  MG 2.8* 3.2*  --  2.6*  --  2.4 2.4  PHOS 3.0 3.4 3.0 2.7 3.3 3.8 3.7   GFR: Estimated Creatinine Clearance: 42.9 mL/min (A) (by C-G formula based on SCr of 1.95 mg/dL (H)). Liver Function Tests: Recent Labs  Lab 03/07/19 0508 03/08/19 0400 03/09/19 0350 03/09/19 1613 03/10/19 0657 03/10/19 1705 03/11/19 0558 03/12/19 0602  AST 3,174* 1,390* 859*  --   --   --  290* 156*  ALT 1,509* 860* 683*  --   --   --  450* 318*  ALKPHOS 79 70 75  --   --   --  78 79  BILITOT 28.2* 25.2* 27.1*  --   --   --  35.2* 29.4*  PROT 6.6 6.1* 6.9  --   --   --  7.6 7.4  ALBUMIN 2.5* 2.4* 2.6* 2.2* 2.2* 2.2* 2.4* 2.2*   No results for input(s): LIPASE, AMYLASE in the last 168 hours. Recent Labs  Lab 03/09/19 1155 03/10/19 0908 03/11/19 0550 03/12/19 0602  AMMONIA 29 23 40* 65*   Coagulation Profile: Recent Labs  Lab 03/08/19 1442 03/09/19 0350 03/10/19 0857 03/11/19 0802 03/12/19 0602  INR 1.7* 1.8* 1.9* 1.8* 1.8*   Cardiac Enzymes: No results for input(s): CKTOTAL, CKMB, CKMBINDEX, TROPONINI in the last 168 hours. BNP (last 3 results) No results for input(s): PROBNP in the last 8760 hours. HbA1C: No results for input(s): HGBA1C in the last 72 hours. CBG: Recent Labs  Lab 03/11/19 0801 03/11/19 1209 03/11/19 2354 03/12/19 0520 03/12/19 1257  GLUCAP 114* 100* 101* 93 75   Lipid Profile: No results for input(s): CHOL, HDL, LDLCALC, TRIG, CHOLHDL, LDLDIRECT in the last 72 hours. Thyroid Function Tests: No results for  input(s): TSH, T4TOTAL, FREET4, T3FREE, THYROIDAB in the last 72 hours. Anemia Panel: No results for input(s): VITAMINB12, FOLATE, FERRITIN, TIBC, IRON, RETICCTPCT in the last 72 hours. Sepsis Labs: No results for input(s): PROCALCITON, LATICACIDVEN in the last 168 hours.  Recent Results (from the past 240 hour(s))  SARS CORONAVIRUS 2 (TAT 6-24 HRS) Nasopharyngeal Nasopharyngeal Swab     Status: None   Collection Time: 02/22/2019  8:50 PM   Specimen: Nasopharyngeal Swab  Result Value Ref Range Status   SARS Coronavirus 2 NEGATIVE NEGATIVE Final    Comment: (NOTE) SARS-CoV-2 target nucleic acids are NOT DETECTED. The SARS-CoV-2 RNA is generally detectable in upper and lower respiratory specimens during the acute phase of infection. Negative results do not preclude SARS-CoV-2 infection, do not rule out co-infections with other pathogens, and should not be used as the sole basis for treatment or other patient management decisions. Negative results must be combined with clinical observations, patient history, and epidemiological information. The expected result is Negative. Fact Sheet for Patients: SugarRoll.be Fact Sheet for Healthcare Providers: https://www.woods-mathews.com/ This test is not yet approved or cleared by the [place] FDA and  has been authorized for detection and/or diagnosis of SARS-CoV-2 by FDA under an Emergency Use Authorization (EUA). This EUA will remain  in effect (meaning this test can be used) for the duration of the COVID-19 declaration under Section  56 4(b)(1) of the Act, 21 U.S.C. section 360bbb-3(b)(1), unless the authorization is terminated or revoked sooner. Performed at [facility], [place] [address], [place], [place] [address]   Culture, blood (routine x 2)     Status: None   Collection Time: 03/01/2019 12:15 PM   Specimen: BLOOD RIGHT HAND  Result Value Ref Range Status   Specimen Description BLOOD  RIGHT HAND  Final   Special Requests   Final    BOTTLES DRAWN AEROBIC ONLY Blood Culture results may not be optimal due to an inadequate volume of blood received in culture bottles   Culture   Final    NO GROWTH 5 DAYS Performed at [facility], [place] [address] [place], [place] [address]    Report Status 03/09/2019 FINAL  Final  Culture, blood (routine x 2)     Status: None   Collection Time: 02/26/2019 12:15 PM   Specimen: BLOOD RIGHT HAND  Result Value Ref Range Status   Specimen Description BLOOD RIGHT HAND  Final   Special Requests   Final    BOTTLES DRAWN AEROBIC ONLY Blood Culture results may not be optimal due to an inadequate volume of blood received in culture bottles   Culture   Final    NO GROWTH 5 DAYS Performed at [facility], [place] [address] [place], [place] [address]    Report Status 03/09/2019 FINAL  Final         Radiology Studies: No results found.      Scheduled Meds: . Chlorhexidine Gluconate Cloth  6 each Topical Daily  . feeding supplement (ENSURE ENLIVE)  237 mL Oral BID BM  . ipratropium-albuterol  3 mL Nebulization TID  . lactulose  30 g Oral TID  . mouth rinse  15 mL Mouth Rinse BID  . mometasone-formoterol  2 puff Inhalation BID  . multivitamin with minerals  1 tablet Oral Daily  . OLANZapine zydis  5 mg Oral QHS  . pantoprazole (PROTONIX) IV  40 mg Intravenous Q12H  . propranolol  20 mg Oral BID  . rifaximin  550 mg Oral BID  . sodium chloride flush  10-40 mL Intracatheter Q12H   Continuous Infusions: . sodium chloride 10 mL/hr at 03/11/19 0200  . sodium chloride 10 mL/hr at 03/11/19 1645  . piperacillin-tazobactam 3.375 g (03/12/19 0646)     LOS: 9 days     Cordelia Poche, MD [facility] 03/12/2019, 3:01 PM  If 7PM-7AM, please contact night-coverage [email or web address]

## 2019-03-13 LAB — COMPREHENSIVE METABOLIC PANEL
ALT: 225 U/L — ABNORMAL HIGH (ref 0–44)
AST: 96 U/L — ABNORMAL HIGH (ref 15–41)
Albumin: 2 g/dL — ABNORMAL LOW (ref 3.5–5.0)
Alkaline Phosphatase: 71 U/L (ref 38–126)
Anion gap: 11 (ref 5–15)
BUN: 32 mg/dL — ABNORMAL HIGH (ref 6–20)
CO2: 22 mmol/L (ref 22–32)
Calcium: 8.4 mg/dL — ABNORMAL LOW (ref 8.9–10.3)
Chloride: 110 mmol/L (ref 98–111)
Creatinine, Ser: 2 mg/dL — ABNORMAL HIGH (ref 0.61–1.24)
GFR calc Af Amer: 43 mL/min — ABNORMAL LOW (ref >60)
GFR calc non Af Amer: 37 mL/min — ABNORMAL LOW (ref >60)
Glucose, Bld: 91 mg/dL (ref 70–99)
Potassium: 3.6 mmol/L (ref 3.5–5.1)
Sodium: 143 mmol/L (ref 135–145)
Total Bilirubin: 28.2 mg/dL (ref 0.3–1.2)
Total Protein: 6.9 g/dL (ref 6.5–8.1)

## 2019-03-13 LAB — GLUCOSE, CAPILLARY
Glucose-Capillary: 83 mg/dL (ref 70–99)
Glucose-Capillary: 74 mg/dL (ref 70–99)
Glucose-Capillary: 113 mg/dL — ABNORMAL HIGH (ref 70–99)
Glucose-Capillary: 111 mg/dL — ABNORMAL HIGH (ref 70–99)

## 2019-03-13 LAB — CBC
HCT: 27.6 % — ABNORMAL LOW (ref 39.0–52.0)
Hemoglobin: 9.2 g/dL — ABNORMAL LOW (ref 13.0–17.0)
MCH: 32.6 pg (ref 26.0–34.0)
MCHC: 33.3 g/dL (ref 30.0–36.0)
MCV: 97.9 fL (ref 80.0–100.0)
Platelets: 124 10*3/uL — ABNORMAL LOW (ref 150–400)
RBC: 2.82 MIL/uL — ABNORMAL LOW (ref 4.22–5.81)
RDW: 25.2 % — ABNORMAL HIGH (ref 11.5–15.5)
WBC: 14.6 10*3/uL — ABNORMAL HIGH (ref 4.0–10.5)
nRBC: 0.2 % (ref 0.0–0.2)

## 2019-03-13 LAB — MAGNESIUM: Magnesium: 2 mg/dL (ref 1.7–2.4)

## 2019-03-13 MED ORDER — ONDANSETRON HCL 4 MG/2ML IJ SOLN
4.0000 mg | Freq: Four times a day (QID) | INTRAMUSCULAR | Status: DC | PRN
  Administered 2019-03-13 (×2): 4 mg via INTRAVENOUS
  Filled 2019-03-13 (×2): qty 2

## 2019-03-13 NOTE — Progress Notes (Signed)
OT Cancellation Note  Patient Details Name: Beatriz Settles MRN: [id number] DOB: [birth year]   Cancelled Treatment:    Reason Eval/Treat Not Completed: Fatigue/lethargy limiting ability to participate;Other (comment). Per RN, hold on pt this morning and afternoon with RN stating that today is not a good day for the pt. OT attempted x 2 today to see this pt. Will try again next available day/time.   Emmit Alexanders [facility] 03/13/2019, 1:26 PM

## 2019-03-13 NOTE — Progress Notes (Addendum)
Pt continues to refuse full dose of Lactulose. Pt only agrees to take 10 mg (a third) of each scheduled dose. Ammonia level up to 65 on 03/12/19.

## 2019-03-13 NOTE — Progress Notes (Signed)
Pt's O2 sat dropped to 64% while ambulating in hallway on room air to nurse's station. Pt was returned to his room. O2 sat returned to 97% on room air once patient got back into bed. Will continue to monitor.

## 2019-03-13 NOTE — Progress Notes (Signed)
PROGRESS NOTE    Glenn Alvarado  EHM:[id number] DOB: January 15, 1966 DOA: 02/08/2019 PCP: Patient, No Pcp Per   Brief Narrative: Glenn Alvarado is a 54 y.o. male with hepatitis B, C, COPD, hepatic cirrhosis, substance use who presents for treatment of acute decompensated cirrhosis.  Patient was initially managed in ICU for hemorrhagic shock requiring intubation.  Patient underwent CRRT for acute kidney injury with associated severe hyperkalemia.  GI consulted and is recommending transfer to [facility] for possible liver transplant.   Assessment & Plan:   Principal Problem:   Hepatic encephalopathy ([name]) Active Problems:   CAD (coronary artery disease)   Essential hypertension   COPD (chronic obstructive pulmonary disease) ([name])   Acute hepatitis   Decompensated hepatic cirrhosis ([name])   Acute encephalopathy   Cholelithiasis   Cholecystitis   Metabolic acidosis   Decompensated liver cirrhosis Alcoholic steatohepatitis/NASH Patient initially required intubation and management ICU for hemorrhagic shock.  Patient transition off of pressors.  GI consulted and is recommending transfer to [facility] which is in process.  Associated ischemic hepatitis and shock liver with peak AST/ALT of 6497/1998 respectively. MELDNa of 32. Recent hepatitis A infection. History of Hepatitis B and C. INR of 1.8, bilirubin of 28.2, AST/ALT of 96/225 (downtrending), last ammonia of 65 -GI recommendations: Continue lactulose 30 g 3 times daily, rifaximin 550 mg twice daily. Transfer to [facility]  GI Bleeding GI consulted.  Unclear source with no evidence of esophageal varices, however this cannot be ruled out.  Patient with large amounts of blood noted in the cardia and fundus from recent EGD on 12/29. Hemoglobin stable. -Continue Propranolol -Watch for recurrent bleeding -Daily CBC  Acute respiratory failure Patient required intubation from 12/28 until 12/31. Resolved.  Hypoglycemia Required D10 IV fluids which  have now been discontinued. Tolerating oral diet.  Acute kidney injury on CKD stage III Patient required CRRT while in the ICU which is now discontinued. Creatinine stable.  Hypernatremia Mild. Resolved.  Distended gallbladder Concern for possible cholecystitis. Patient treated with Ceftriaxone initially with transition to Zosyn. Not a candidate for IR guided cholecystostomy/cholecystectomy per IR and general surgery  Hepatic encephalopathy Patient appears improved but still with some confusion. Associated abdominal pain. Treating for SBP. Refusing complete dosing of lactulose but still having frequent stools -Continue Xifaxan and lactulose -Continue Zosyn  Leukocytosis Acutely elevated in setting of hydrocortisone. Trending down. -Daily CBC  COPD -Continue Dulera, Duoneb  Essential hypertension On amlodipine as an outpatient; held.   DVT prophylaxis: SCDs Code Status:   Code Status: Full Code Family Communication: Stepfather on telephone Disposition Plan: Discharge to [facility] when bed is available   Consultants:   PCCM  Gastroenterology  Interventional radiology  General surgery  Procedures:   12/29: Transthoracic Echocardiogram  Findings:      The examined esophagus was normal.      Diffuse moderately erythematous mucosa was found in the stomach that       could be seen. Estimated blood loss: none.      Clotted blood was found in the gastric fundus, in the gastric body and       on the greater curvature of the stomach. Large amount of old blood and       clot prevented a good view and it was so thick it could not be suctioned.      The examined duodenum was normal. Impression:       - Normal esophagus.                           -  Erythematous mucosa in the stomach.                           - Clotted blood in the greater curvature of the                            stomach, in the gastric fundus and in the gastric                            body.                            - Normal examined duodenum.                           - No specimens collected. Recommendation:           - NPO.                           - Continue present medications.                           - Continue supportive care. PPI, Octreotide.                            Transfuse as needed. Follow clinically.  Antimicrobials:  Ceftriaxone  Zosyn    Subjective: Abdominal pain. No other concerns.  Objective: Vitals:   03/13/19 0058 03/13/19 0306 03/13/19 0728 03/13/19 0858  BP:  133/76  112/68  Pulse:  77 74 74  Resp:  18 20 20   Temp:  97.8 F (36.6 C)  98.2 F (36.8 C)  TempSrc:  Oral  Oral  SpO2:  99% 98% 96%  Weight: 80.7 kg     Height:        Intake/Output Summary (Last 24 hours) at 03/13/2019 0951 Last data filed at 03/13/2019 0900 Gross per 24 hour  Intake 951.53 ml  Output 925 ml  Net 26.53 ml   Filed Weights   03/12/19 0053 03/12/19 0128 03/13/19 0058  Weight: 79.2 kg 77.1 kg 80.7 kg    Examination:  General exam: Appears calm and comfortable Eyes: scleral icterus Respiratory system: Clear to auscultation. Respiratory effort normal. Cardiovascular system: S1 & S2 heard, RRR. No murmurs, rubs, gallops or clicks. Gastrointestinal system: Abdomen is distended, soft and tender on flank bilaterally. Ventral hernia is easily reducible and non-tender. Normal bowel sounds heard. Central nervous system: Alert and oriented. No focal neurological deficits. Extremities: No edema. No calf tenderness Skin: No cyanosis. Jaundice Psychiatry: Judgement and insight appear impaired. Mood & affect appropriate.    Data Reviewed: I have personally reviewed following labs and imaging studies  CBC: Recent Labs  Lab 03/09/19 0350 03/10/19 0607 03/11/19 0558 03/12/19 0602 03/13/19 0500  WBC 4.2 6.7 11.9* 20.7* 14.6*  HGB 8.0* 7.8* 9.2* 9.8* 9.2*  HCT 24.5* 23.4* 27.5* 31.2* 27.6*  MCV 96.8 95.5 96.5 101.3* 97.9  PLT 48* 57* 81* 147* 124*   Basic  Metabolic Panel: Recent Labs  Lab 03/09/19 0350 03/09/19 1613 03/10/19 0657 03/10/19 1705 03/11/19 0558 03/12/19 0602 03/13/19 0500  NA 147* 146* 147* 146* 144 146* 143  K 3.4* 2.9* 3.3* 3.4* 3.3*  3.4* 3.6  CL 112* 114* 112* 115* 114* 114* 110  CO2 23 23 22  19* 20* 20* 22  GLUCOSE 96 156* 102* 97 140* 152* 91  BUN 44* 46* 38* 32* 25* 31* 32*  CREATININE 2.37* 2.31* 2.55* 2.15* 1.87* 1.95* 2.00*  CALCIUM 8.6* 8.2* 8.2* 8.4* 8.5* 8.5* 8.4*  MG 3.2*  --  2.6*  --  2.4 2.4 2.0  PHOS 3.4 3.0 2.7 3.3 3.8 3.7  --    GFR: Estimated Creatinine Clearance: 41.8 mL/min (A) (by C-G formula based on SCr of 2 mg/dL (H)). Liver Function Tests: Recent Labs  Lab 03/08/19 0400 03/09/19 0350 03/10/19 0657 03/10/19 1705 03/11/19 0558 03/12/19 0602 03/13/19 0500  AST 1,390* 859*  --   --  290* 156* 96*  ALT 860* 683*  --   --  450* 318* 225*  ALKPHOS 70 75  --   --  78 79 71  BILITOT 25.2* 27.1*  --   --  35.2* 29.4* 28.2*  PROT 6.1* 6.9  --   --  7.6 7.4 6.9  ALBUMIN 2.4* 2.6* 2.2* 2.2* 2.4* 2.2* 2.0*   No results for input(s): LIPASE, AMYLASE in the last 168 hours. Recent Labs  Lab 03/09/19 1155 03/10/19 0908 03/11/19 0550 03/12/19 0602  AMMONIA 29 23 40* 65*   Coagulation Profile: Recent Labs  Lab 03/08/19 1442 03/09/19 0350 03/10/19 0857 03/11/19 0802 03/12/19 0602  INR 1.7* 1.8* 1.9* 1.8* 1.8*   Cardiac Enzymes: No results for input(s): CKTOTAL, CKMB, CKMBINDEX, TROPONINI in the last 168 hours. BNP (last 3 results) No results for input(s): PROBNP in the last 8760 hours. HbA1C: No results for input(s): HGBA1C in the last 72 hours. CBG: Recent Labs  Lab 03/12/19 1624 03/12/19 2059 03/13/19 0152 03/13/19 0416 03/13/19 0750  GLUCAP 95 87 83 74 113*   Lipid Profile: No results for input(s): CHOL, HDL, LDLCALC, TRIG, CHOLHDL, LDLDIRECT in the last 72 hours. Thyroid Function Tests: No results for input(s): TSH, T4TOTAL, FREET4, T3FREE, THYROIDAB in the last 72  hours. Anemia Panel: No results for input(s): VITAMINB12, FOLATE, FERRITIN, TIBC, IRON, RETICCTPCT in the last 72 hours. Sepsis Labs: No results for input(s): PROCALCITON, LATICACIDVEN in the last 168 hours.  Recent Results (from the past 240 hour(s))  Culture, blood (routine x 2)     Status: None   Collection Time: 02/17/2019 12:15 PM   Specimen: BLOOD RIGHT HAND  Result Value Ref Range Status   Specimen Description BLOOD RIGHT HAND  Final   Special Requests   Final    BOTTLES DRAWN AEROBIC ONLY Blood Culture results may not be optimal due to an inadequate volume of blood received in culture bottles   Culture   Final    NO GROWTH 5 DAYS Performed at [facility], [place] [address], [place], [place] [address]    Report Status 03/09/2019 FINAL  Final  Culture, blood (routine x 2)     Status: None   Collection Time: 02/26/2019 12:15 PM   Specimen: BLOOD RIGHT HAND  Result Value Ref Range Status   Specimen Description BLOOD RIGHT HAND  Final   Special Requests   Final    BOTTLES DRAWN AEROBIC ONLY Blood Culture results may not be optimal due to an inadequate volume of blood received in culture bottles   Culture   Final    NO GROWTH 5 DAYS Performed at [facility], [place] [address], [place], [place] [address]    Report Status 03/09/2019  FINAL  Final         Radiology Studies: No results found.      Scheduled Meds: . Chlorhexidine Gluconate Cloth  6 each Topical Daily  . feeding supplement (ENSURE ENLIVE)  237 mL Oral BID BM  . ipratropium-albuterol  3 mL Nebulization TID  . lactulose  30 g Oral TID  . mouth rinse  15 mL Mouth Rinse BID  . mometasone-formoterol  2 puff Inhalation BID  . multivitamin with minerals  1 tablet Oral Daily  . OLANZapine zydis  5 mg Oral QHS  . pantoprazole (PROTONIX) IV  40 mg Intravenous Q12H  . propranolol  20 mg Oral BID  . rifaximin  550 mg Oral BID  . sodium chloride flush  10-40 mL Intracatheter Q12H   Continuous Infusions: .  sodium chloride 10 mL/hr at 03/11/19 0200  . sodium chloride 10 mL/hr at 03/13/19 0656  . piperacillin-tazobactam 3.375 g (03/13/19 0658)     LOS: 10 days     Cordelia Poche, MD [facility] 03/13/2019, 9:51 AM  If 7PM-7AM, please contact night-coverage [email or web address]

## 2019-03-13 NOTE — Progress Notes (Signed)
Subjective: Patient seen and examined at bedside.  Complains of right upper quadrant abdominal pain, has been nauseous and was vomiting small amount of clear fluid. States he is not able to tolerate certain foods like sausage. He had a bowel movement today.  Objective: Vital signs in last 24 hours: Temp:  [97.5 F (36.4 C)-98.2 F (36.8 C)] 98.2 F (36.8 C) (01/07 0858) Pulse Rate:  [74-85] 74 (01/07 1306) Resp:  [18-22] 22 (01/07 1306) BP: (112-144)/(68-87) 112/68 (01/07 0858) SpO2:  [96 %-100 %] 96 % (01/07 0858) Weight:  [80.7 kg] 80.7 kg (01/07 0058) Weight change: 1.24 kg Last BM Date: 03/12/19  PE: Restless GENERAL: Deep icterus ABDOMEN: Distended abdomen, right upper quadrant tenderness, umbilical hernia EXTREMITIES: No deformity  Lab Results: Results for orders placed or performed during the hospital encounter of 02/10/2019 (from the past 48 hour(s))  Glucose, capillary     Status: Abnormal   Collection Time: 03/11/19 11:54 PM  Result Value Ref Range   Glucose-Capillary 101 (H) 70 - 99 mg/dL  Glucose, capillary     Status: None   Collection Time: 03/12/19  5:20 AM  Result Value Ref Range   Glucose-Capillary 93 70 - 99 mg/dL   Comment 1 Notify RN    Comment 2 Document in Chart   Magnesium     Status: None   Collection Time: 03/12/19  6:02 AM  Result Value Ref Range   Magnesium 2.4 1.7 - 2.4 mg/dL    Comment: Performed at [facility], [place] [address], [place], [place] [address]  Ammonia     Status: Abnormal   Collection Time: 03/12/19  6:02 AM  Result Value Ref Range   Ammonia 65 (H) 9 - 35 umol/L    Comment: Performed at [facility], [place] [address] [place], [place] [address]  Phosphorus     Status: None   Collection Time: 03/12/19  6:02 AM  Result Value Ref Range   Phosphorus 3.7 2.5 - 4.6 mg/dL    Comment: Performed at [facility] [address] [place], [place] [address]  Protime-INR     Status: Abnormal   Collection Time: 03/12/19   6:02 AM  Result Value Ref Range   Prothrombin Time 20.8 (H) 11.4 - 15.2 seconds   INR 1.8 (H) 0.8 - 1.2    Comment: (NOTE) INR goal varies based on device and disease states. Performed at [facility], [place] [address] [place], [place] [address]   CBC     Status: Abnormal   Collection Time: 03/12/19  6:02 AM  Result Value Ref Range   WBC 20.7 (H) 4.0 - 10.5 K/uL   RBC 3.08 (L) 4.22 - 5.81 MIL/uL   Hemoglobin 9.8 (L) 13.0 - 17.0 g/dL   HCT 31.2 (L) 39.0 - 52.0 %   MCV 101.3 (H) 80.0 - 100.0 fL   MCH 31.8 26.0 - 34.0 pg   MCHC 31.4 30.0 - 36.0 g/dL   RDW 25.5 (H) 11.5 - 15.5 %   Platelets 147 (L) 150 - 400 K/uL    Comment: REPEATED TO VERIFY PLATELET COUNT CONFIRMED BY SMEAR    nRBC 0.4 (H) 0.0 - 0.2 %    Comment: Performed at [facility], [place] [address] [place], [place] [address]  Comprehensive metabolic panel     Status: Abnormal   Collection Time: 03/12/19  6:02 AM  Result Value Ref Range   Sodium 146 (H) 135 - 145 mmol/L  Potassium 3.4 (L) 3.5 - 5.1 mmol/L   Chloride 114 (H) 98 - 111 mmol/L   CO2 20 (L) 22 - 32 mmol/L   Glucose, Bld 152 (H) 70 - 99 mg/dL   BUN 31 (H) 6 - 20 mg/dL   Creatinine, Ser 1.95 (H) 0.61 - 1.24 mg/dL   Calcium 8.5 (L) 8.9 - 10.3 mg/dL   Total Protein 7.4 6.5 - 8.1 g/dL   Albumin 2.2 (L) 3.5 - 5.0 g/dL   AST 156 (H) 15 - 41 U/L   ALT 318 (H) 0 - 44 U/L   Alkaline Phosphatase 79 38 - 126 U/L   Total Bilirubin 29.4 (HH) 0.3 - 1.2 mg/dL    Comment: NO VISIBLE HEMOLYSIS CRITICAL RESULT CALLED TO, READ BACK BY AND VERIFIED WITH: ODEMELAM,F RN @0705  ON [id number] BY FLEMINGS    GFR calc non Af Amer 38 (L) >60 mL/min   GFR calc Af Amer 45 (L) >60 mL/min   Anion gap 12 5 - 15    Comment: Performed at [facility], [place] [address], [place], [place] [address]  Glucose, capillary     Status: None   Collection Time: 03/12/19 12:57 PM  Result Value Ref Range   Glucose-Capillary 75 70 - 99 mg/dL  Glucose, capillary     Status: None    Collection Time: 03/12/19  4:24 PM  Result Value Ref Range   Glucose-Capillary 95 70 - 99 mg/dL  Glucose, capillary     Status: None   Collection Time: 03/12/19  8:59 PM  Result Value Ref Range   Glucose-Capillary 87 70 - 99 mg/dL  Glucose, capillary     Status: None   Collection Time: 03/13/19  1:52 AM  Result Value Ref Range   Glucose-Capillary 83 70 - 99 mg/dL  Glucose, capillary     Status: None   Collection Time: 03/13/19  4:16 AM  Result Value Ref Range   Glucose-Capillary 74 70 - 99 mg/dL  Magnesium     Status: None   Collection Time: 03/13/19  5:00 AM  Result Value Ref Range   Magnesium 2.0 1.7 - 2.4 mg/dL    Comment: Performed at [facility], [place] [address] [place], [place] [address]  Comprehensive metabolic panel     Status: Abnormal   Collection Time: 03/13/19  5:00 AM  Result Value Ref Range   Sodium 143 135 - 145 mmol/L   Potassium 3.6 3.5 - 5.1 mmol/L   Chloride 110 98 - 111 mmol/L   CO2 22 22 - 32 mmol/L   Glucose, Bld 91 70 - 99 mg/dL   BUN 32 (H) 6 - 20 mg/dL   Creatinine, Ser 2.00 (H) 0.61 - 1.24 mg/dL   Calcium 8.4 (L) 8.9 - 10.3 mg/dL   Total Protein 6.9 6.5 - 8.1 g/dL   Albumin 2.0 (L) 3.5 - 5.0 g/dL   AST 96 (H) 15 - 41 U/L   ALT 225 (H) 0 - 44 U/L   Alkaline Phosphatase 71 38 - 126 U/L   Total Bilirubin 28.2 (HH) 0.3 - 1.2 mg/dL    Comment: CRITICAL VALUE NOTED.  VALUE IS CONSISTENT WITH PREVIOUSLY REPORTED AND CALLED VALUE.   GFR calc non Af Amer 37 (L) >60 mL/min   GFR calc Af Amer 43 (L) >60 mL/min   Anion gap 11 5 - 15    Comment: Performed at [facility] [address], [place], [place] [address]  Daily CBC  Status: Abnormal   Collection Time: 03/13/19  5:00 AM  Result Value Ref Range   WBC 14.6 (H) 4.0 - 10.5 K/uL   RBC 2.82 (L) 4.22 - 5.81 MIL/uL   Hemoglobin 9.2 (L) 13.0 - 17.0 g/dL   HCT 27.6 (L) 39.0 - 52.0 %   MCV 97.9 80.0 - 100.0 fL   MCH 32.6 26.0 - 34.0 pg   MCHC 33.3 30.0 - 36.0 g/dL   RDW 25.2 (H) 11.5 -  15.5 %   Platelets 124 (L) 150 - 400 K/uL    Comment: REPEATED TO VERIFY Immature Platelet Fraction may be clinically indicated, consider ordering this additional test [id number]    nRBC 0.2 0.0 - 0.2 %    Comment: Performed at [facility], [place] [address] [place], [place] [address]  Glucose, capillary     Status: Abnormal   Collection Time: 03/13/19  7:50 AM  Result Value Ref Range   Glucose-Capillary 113 (H) 70 - 99 mg/dL   Comment 1 Notify RN    Comment 2 Document in Chart     Studies/Results: No results found.  Medications: I have reviewed the patient's current medications.  Assessment: Decompensated liver cirrhosis-recent acute hepatitis A, history of hepatitis B and hepatitis C, recent ischemic hepatitis, waiting for transfer to [facility] today cannot be calculated, last INR was on 03/12/2019  T bili trending down to 28.2, AST/ALT trending down to 96/225, normal ALP Impaired renal function, BUN 32/creatinine 2/GFR 37  Gallbladder wall thickening and gallstones, suspected cholecystitis, being treated empirically with IV Zosyn, LFTs have trended down to 14.6  Normocytic anemia, had evidence of blood clot on gastric fundus on 2 recent endoscopies, empirically started on beta-blocker  Plan: Discussed with Dr.Muir/hepatologist at [facility]-due to severe elevation in total bilirubin and impaired renal function, elevated MELD patient will likely not be able to be discharged for follow-up for liver transplant evaluation.  As such she has been accepted at [facility], is awaiting bed placement.  Because of nausea, vomiting and abdominal pain, with suspected cholecystitis, I will change his diet to clear liquids and start him on Zofran as needed.  Continue lactulose 30 g 3 times a day and Xifaxan.  Continue propanolol 20 mg twice daily.  Prognosis remains guarded.  Ronnette Juniper, MD 03/13/2019, 3:28 PM

## 2019-03-14 ENCOUNTER — Inpatient Hospital Stay (HOSPITAL_COMMUNITY): Payer: Medicare HMO | Admitting: Certified Registered"

## 2019-03-14 ENCOUNTER — Encounter (HOSPITAL_COMMUNITY): Payer: Self-pay | Admitting: Internal Medicine

## 2019-03-14 DIAGNOSIS — Z7189 Other specified counseling: Secondary | ICD-10-CM

## 2019-03-14 DIAGNOSIS — I469 Cardiac arrest, cause unspecified: Secondary | ICD-10-CM

## 2019-03-14 LAB — CBC
HCT: 28.5 % — ABNORMAL LOW (ref 39.0–52.0)
Hemoglobin: 9.4 g/dL — ABNORMAL LOW (ref 13.0–17.0)
MCH: 32.9 pg (ref 26.0–34.0)
MCHC: 33 g/dL (ref 30.0–36.0)
MCV: 99.7 fL (ref 80.0–100.0)
Platelets: 158 10*3/uL (ref 150–400)
RBC: 2.86 MIL/uL — ABNORMAL LOW (ref 4.22–5.81)
RDW: 25.4 % — ABNORMAL HIGH (ref 11.5–15.5)
WBC: 15.6 10*3/uL — ABNORMAL HIGH (ref 4.0–10.5)
nRBC: 0.1 % (ref 0.0–0.2)

## 2019-03-14 LAB — COMPREHENSIVE METABOLIC PANEL
ALT: 196 U/L — ABNORMAL HIGH (ref 0–44)
AST: 96 U/L — ABNORMAL HIGH (ref 15–41)
Albumin: 2 g/dL — ABNORMAL LOW (ref 3.5–5.0)
Alkaline Phosphatase: 72 U/L (ref 38–126)
Anion gap: 9 (ref 5–15)
BUN: 31 mg/dL — ABNORMAL HIGH (ref 6–20)
CO2: 21 mmol/L — ABNORMAL LOW (ref 22–32)
Calcium: 8.6 mg/dL — ABNORMAL LOW (ref 8.9–10.3)
Chloride: 108 mmol/L (ref 98–111)
Creatinine, Ser: 1.91 mg/dL — ABNORMAL HIGH (ref 0.61–1.24)
GFR calc Af Amer: 46 mL/min — ABNORMAL LOW (ref >60)
GFR calc non Af Amer: 39 mL/min — ABNORMAL LOW (ref >60)
Glucose, Bld: 97 mg/dL (ref 70–99)
Potassium: 3.6 mmol/L (ref 3.5–5.1)
Sodium: 138 mmol/L (ref 135–145)
Total Bilirubin: 31.6 mg/dL (ref 0.3–1.2)
Total Protein: 6.7 g/dL (ref 6.5–8.1)

## 2019-03-14 LAB — MAGNESIUM: Magnesium: 2.1 mg/dL (ref 1.7–2.4)

## 2019-03-14 LAB — GLUCOSE, CAPILLARY: Glucose-Capillary: 86 mg/dL (ref 70–99)

## 2019-03-14 MED ORDER — EPINEPHRINE PF 1 MG/ML IJ SOLN
0.5000 ug/min | INTRAVENOUS | Status: DC
  Filled 2019-03-14: qty 4

## 2019-03-14 MED FILL — Medication: Qty: 1 | Status: AC

## 2019-03-17 LAB — VITAMIN B1: Vitamin B1 (Thiamine): 103.1 nmol/L (ref 66.5–200.0)

## 2019-03-20 ENCOUNTER — Telehealth: Payer: Self-pay

## 2019-03-20 NOTE — Telephone Encounter (Signed)
Received a phone call from Alesia Richards regarding a dc... I told Boubacar that this dc should go to [facility] and gave him their number and where they were.

## 2019-04-07 NOTE — Procedures (Signed)
CPR Note  PEA arrest x1 hour, VT shocked, epi given, bicarb given, patient did not survive code, please see code sheet for details.  Rush Farmer, M.D. [facility] Pulmonary/Critical Care Medicine.

## 2019-04-07 NOTE — Death Summary Note (Signed)
DEATH SUMMARY   Patient Details  Name: Glenn Alvarado MRN: [id number] DOB: 01-Apr-1965  Admission/Discharge Information   Admit Date:  March 29, 2019  Date of Death: Date of Death: 04/10/19  Time of Death: Time of Death: 0815  Length of Stay: 05-13-2022  Referring Physician: Patient, No Pcp Per   Reason(s) for Hospitalization  Confusion, nausea, vomiting  Diagnoses  Preliminary cause of death:  Secondary Diagnoses (including complications and co-morbidities):  Principal Problem:   Hepatic encephalopathy ([place]) Active Problems:   CAD (coronary artery disease)   Essential hypertension   COPD (chronic obstructive pulmonary disease) ([place])   Acute hepatitis   Decompensated hepatic cirrhosis ([place])   Acute encephalopathy   Cholelithiasis   Cholecystitis   Metabolic acidosis   Cardiac arrest, cause unspecified [facility])   [place] Hospital Course (including significant findings, care, treatment, and services provided and events leading to death)  Glenn Alvarado is a 54 y.o. year old male with hepatitis B, C, COPD, hepatic cirrhosis, substance use who presents for treatment of acute decompensated cirrhosis.  Patient was initially managed in ICU for hemorrhagic shock requiring intubation.  Patient underwent CRRT for acute kidney injury with associated severe hyperkalemia.  GI consulted and recommended transfer to [facility] for possible liver transplant.  Decompensated liver cirrhosis Alcoholic steatohepatitis/NASH Patient initially required intubation and management ICU for hemorrhagic shock.  Patient transition off of pressors.  GI consulted and is recommending transfer to [facility] which is in process.  Associated ischemic hepatitis and shock liver with peak AST/ALT of 6497/1998 respectively. MELDNa of 32, which is assocaited with a 52.6% mortality. Recent hepatitis A infection. History of Hepatitis B and C. INR of 1.8, bilirubin of 28.2, AST/ALT of 96/225 (downtrending), last ammonia of 65. On day of  death, patient was found unresponsive in his room with no pulse; PEA arrest. ACLS initiated; patient received VT shock, epinephrine, bicarb and patient achieved ROSC after 47 minutes and transferred to the ICU. He was intubated during the code and started on epinephrine drip. Patient continued to have hypotension with SBP in 60s and patient allowed to die after discussion with family by critical care medicine.  GI Bleeding GI consulted.  Unclear source with no evidence of esophageal varices, however this cannot be ruled out.  Patient with large amounts of blood noted in the cardia and fundus from recent EGD on 12/29. Hemoglobin stable.  Acute respiratory failure Patient required intubation from 12/28 until 12/31. Resolved.  Hypoglycemia Required D10 IV fluids which have now been discontinued. Tolerating oral diet.  Acute kidney injury on CKD stage III Patient required CRRT while in the ICU which is now discontinued. Creatinine stable.  Hypernatremia Mild. Resolved.  Distended gallbladder Concern for possible cholecystitis. Patient treated with Ceftriaxone initially with transition to Zosyn. Not a candidate for IR guided cholecystostomy/cholecystectomy per IR and general surgery  Hepatic encephalopathy Managed with Xifaxin and lactulose  Leukocytosis Acutely elevated in setting of hydrocortisone. Trended down  COPD Managed on Dulera and Duoneb  Essential hypertension On amlodipine as an outpatient; held.   Pertinent Labs and Studies  Significant Diagnostic Studies CT ABDOMEN PELVIS WO CONTRAST  Result Date: 02/23/2019 CLINICAL DATA:  Acute generalized abdominal pain and distention. Jaundice. EXAM: CT ABDOMEN AND PELVIS WITHOUT CONTRAST TECHNIQUE: Multidetector CT imaging of the abdomen and pelvis was performed following the standard protocol without IV contrast. COMPARISON:  February 25, 2019. FINDINGS: Lower chest: No acute abnormality. Hepatobiliary: There is again  noted severe gallbladder distension with large gallstone in the neck  of the gallbladder. Nodular hepatic contours are noted consistent with hepatic cirrhosis. No definite focal abnormality is seen in the liver on these unenhanced images. No definite biliary dilatation is noted. Pancreas: Unremarkable. No pancreatic ductal dilatation or surrounding inflammatory changes. Spleen: Normal in size without focal abnormality. Adrenals/Urinary Tract: Adrenal glands appear normal. Bilateral nonobstructive nephrolithiasis is noted. No hydronephrosis or renal obstruction is noted. Urinary bladder is unremarkable. Stomach/Bowel: Stomach is within normal limits. Appendix appears normal. No evidence of bowel wall thickening, distention, or inflammatory changes. Vascular/Lymphatic: Aortic atherosclerosis. No enlarged abdominal or pelvic lymph nodes. Enlarged collateral veins are seen in the left upper quadrant consistent with portal hypertension. Reproductive: Prostate is unremarkable. Other: No definite ascites is noted. Moderate size fat containing periumbilical hernia is noted. Musculoskeletal: No acute or significant osseous findings. IMPRESSION: 1. Bilateral nonobstructive nephrolithiasis. No hydronephrosis or renal obstruction is noted. 2. Aortic atherosclerosis. 3. Findings consistent with hepatic cirrhosis. 4. Severe gallbladder distension is noted with large gallstone in the neck of the gallbladder. 5. Enlarged collateral veins are seen in the left upper quadrant consistent with portal hypertension. 6. Moderate size fat containing periumbilical hernia. Aortic Atherosclerosis (ICD10-I70.0). Electronically Signed   By: Marijo Conception M.D.   On: 02/09/2019 07:38   CT ABDOMEN PELVIS WO CONTRAST  Result Date: 02/25/2019 CLINICAL DATA:  54 year old male with abdominal pain and vomiting and jaundice. History of cirrhosis. EXAM: CT ABDOMEN AND PELVIS WITHOUT CONTRAST TECHNIQUE: Multidetector CT imaging of the abdomen and  pelvis was performed following the standard protocol without IV contrast. COMPARISON:  CT of the abdomen pelvis dated 07/24/2018. FINDINGS: Evaluation of this exam is limited in the absence of intravenous contrast. Lower chest: Mild emphysema. The visualized lung bases are clear. No intra-abdominal free air or free fluid. Hepatobiliary: Cirrhosis. The gallbladder is distended. There is a 2 cm rim calcified stone in the neck of the gallbladder. No pericholecystic fluid. Pancreas: The pancreas is unremarkable as visualized. Spleen: Splenomegaly measuring 15 cm in craniocaudal length (previously 14 cm). Adrenals/Urinary Tract: The adrenal glands are unremarkable. Vascular calcification versus less likely nonobstructing bilateral renal calculi measuring up to 5 mm in the left kidney. There is no hydronephrosis on either side. The visualized ureters and urinary bladder appear unremarkable. Stomach/Bowel: There is no bowel obstruction or active inflammation. The appendix is normal. Vascular/Lymphatic: There is advanced aortoiliac atherosclerotic disease. No portal venous gas. Upper abdominal varices noted. Reproductive: The prostate and seminal vesicles are grossly unremarkable. No pelvic mass. Other: There is a fat containing umbilical hernia similar to prior CT. No fluid collection. Musculoskeletal: No acute or significant osseous findings. IMPRESSION: 1. Cirrhosis with evidence of portal hypertension including splenomegaly and upper abdominal varices. 2. Cholelithiasis. Distended appearance of the gallbladder similar to prior CT. No intrahepatic biliary ductal dilatation or interval change in the diameter of the CBD since the prior CT. 3. No bowel obstruction or active inflammation. Normal appendix. 4. Aortic Atherosclerosis (ICD10-I70.0). Electronically Signed   By: Anner Crete M.D.   On: 02/25/2019 17:54   CT Head Wo Contrast  Result Date: 03/01/2019 CLINICAL DATA:  Altered mental status EXAM: CT HEAD  WITHOUT CONTRAST TECHNIQUE: Contiguous axial images were obtained from the base of the skull through the vertex without intravenous contrast. COMPARISON:  Jul 24, 2018 FINDINGS: Brain: No evidence of acute territorial infarction, hemorrhage, hydrocephalus,extra-axial collection or mass lesion/mass effect. There is mild low-attenuation changes in the deep white matter consistent with small vessel ischemia. Ventricles are normal in size and contour.  Vascular: No hyperdense vessel or unexpected calcification. Skull: The skull is intact. No fracture or focal lesion identified. Healed left mandibular ramus fracture seen. Sinuses/Orbits: The visualized paranasal sinuses and mastoid air cells are clear. The orbits and globes intact. Other: None IMPRESSION: No acute intracranial abnormality. Findings consistent with chronic small vessel ischemia. Electronically Signed   By: Prudencio Pair M.D.   On: 02/13/2019 20:26   NM Hepatobiliary Liver Func  Result Date: 02/27/2019 CLINICAL DATA:  Abdominal pain, elevated bilirubin, significantly distended gallbladder by CT, cholelithiasis EXAM: NUCLEAR MEDICINE HEPATOBILIARY IMAGING TECHNIQUE: Sequential images of the abdomen were obtained out to 60 minutes following intravenous administration of radiopharmaceutical. RADIOPHARMACEUTICALS:  8.0 mCi Tc-38m Choletec IV COMPARISON:  CT abdomen and pelvis 02/25/2019 FINDINGS: Poor tracer clearance from bloodstream, with significant blood pool remaining at 15 minutes. Liver has grossly normal morphology scintigraphically. At 15 minutes of imaging, no significant tracer excretion into biliary radicles is yet identified. Patient terminated the procedure at 15 minutes, refusing to continue. IMPRESSION: Significantly impaired hepatocellular function with prolonged visualization of blood pool, persisting at least 15 minutes. Patient refused completion of exam at 15 minutes; study is otherwise nondiagnostic. Electronically Signed   By: MLavonia DanaM.D.   On: 02/27/2019 17:30   DG CHEST PORT 1 VIEW  Result Date: 03/07/2019 CLINICAL DATA:  New central venous catheter placement EXAM: PORTABLE CHEST 1 VIEW COMPARISON:  02/25/2019 FINDINGS: Interval endotracheal extubation. Right neck and left neck vascular catheters are again seen, not significantly changed in location or appearance in comparison to prior radiographs dated 02/20/2019. Heterogeneous bibasilar airspace opacity is increased compared to prior examination, which may reflect increased atelectasis. The heart is unremarkable. IMPRESSION: 1. Right neck and left neck vascular catheters are again seen, not significantly changed in location or appearance in comparison to prior radiographs dated 02/08/2019. 2. Interval endotracheal extubation. 3. Increased heterogeneous bibasilar airspace opacity, which may reflect increased atelectasis. Electronically Signed   By: AEddie CandleM.D.   On: 03/07/2019 15:03   DG CHEST PORT 1 VIEW  Result Date: 02/27/2019 CLINICAL DATA:  Encounter for central line placement. EXAM: PORTABLE CHEST 1 VIEW COMPARISON:  Chest radiograph 02/16/2019 FINDINGS: ET tube terminates 6.1 cm above the level of the carina. There are new right and left IJ approach central venous catheters with tips projecting in the region of the superior vena cava. Heart size within normal limits. Aortic atherosclerosis. Blunting of the left lateral costophrenic angle suggestive of a small left pleural effusion. Associated left basilar atelectasis and/or consolidation. No evidence of pneumothorax. No acute bony abnormality IMPRESSION: Lines and tubes as described. Left basilar opacity consistent with atelectasis and/or pneumonia. Probable small left pleural effusion. Electronically Signed   By: KKellie SimmeringDO   On: 02/23/2019 09:17   Portable Chest x-ray  Result Date: 03/01/2019 CLINICAL DATA:  Initial evaluation for intubation. EXAM: PORTABLE CHEST 1 VIEW COMPARISON:  Prior radiograph from  03/01/2019. FINDINGS: Endotracheal tube in place with tip positioned approximately 3.5 cm above the carina. Cardiac and mediastinal silhouettes within normal limits. Lungs are hypoinflated. No focal infiltrates. No edema or visible effusion. No pneumothorax. No acute osseous finding. IMPRESSION: 1. Tip of endotracheal to well positioned approximately 3.5 cm above the carina. 2. No other radiographic evidence for active cardiopulmonary disease. Electronically Signed   By: BJeannine BogaM.D.   On: 02/17/2019 22:15   DG Chest Portable 1 View  Result Date: 02/15/2019 CLINICAL DATA:  54year old male with altered mental status. EXAM:  PORTABLE CHEST 1 VIEW COMPARISON:  Chest radiograph dated 02/27/2019. FINDINGS: Mild diffuse interstitial prominence. No focal consolidation, pleural effusion, or pneumothorax. Top-normal cardiac size. No acute osseous pathology. IMPRESSION: No acute cardiopulmonary process. Electronically Signed   By: Anner Crete M.D.   On: 02/22/2019 19:49   DG CHEST PORT 1 VIEW  Result Date: 02/27/2019 CLINICAL DATA:  Fever and right upper chest pain. EXAM: PORTABLE CHEST 1 VIEW COMPARISON:  11/14/2017 FINDINGS: Lordotic technique is demonstrated. Lungs are adequately inflated without focal airspace consolidation or effusion. Cardiomediastinal silhouette and remainder of the exam is unchanged. IMPRESSION: No active disease. Electronically Signed   By: Marin Olp M.D.   On: 02/27/2019 10:16   DG Abd Portable 1V  Result Date: 03/05/2019 CLINICAL DATA:  Check gastric catheter placement EXAM: PORTABLE ABDOMEN - 1 VIEW COMPARISON:  None. FINDINGS: Gastric catheter is noted within the distal aspect of the stomach. No significant obstructive changes are seen. IMPRESSION: Gastric catheter in place. Electronically Signed   By: Inez Catalina M.D.   On: 03/05/2019 08:43   IR ABDOMEN US LIMITED  Result Date: 02/26/2019 CLINICAL DATA:  Abdominal pain, cirrhosis, paracentesis requested  EXAM: LIMITED ABDOMEN ULTRASOUND FOR ASCITES TECHNIQUE: Limited ultrasound survey for ascites was performed in all four abdominal quadrants. COMPARISON:  CT from previous day FINDINGS: No significant abdominal ascites. There is marked dilatation of the gallbladder instantly noted, as before. IMPRESSION: 1. No evident abdominal ascites.  Paracentesis deferred. 2. Persistent marked distention of the gallbladder. Electronically Signed   By: Lucrezia Europe M.D.   On: 02/26/2019 14:32   [place] EKG SITE RITE  Result Date: 02/18/2019 If Site Rite image not attached, placement could not be confirmed due to current cardiac rhythm.  US Abdomen Limited RUQ  Result Date: 03/07/2019 CLINICAL DATA:  Cholecystitis. EXAM: ULTRASOUND ABDOMEN LIMITED RIGHT UPPER QUADRANT COMPARISON:  March 05, 2019. FINDINGS: Gallbladder: Cholelithiasis is noted with largest stone measuring 2.4 cm. The gallbladder is distended with mild wall thickening measured at 4 mm. The presence or absence of sonographic Murphy's sign was not reported by the technologist. No definite pericholecystic fluid is noted. Common bile duct: Diameter: 8 mm which is mildly dilated. Liver: No focal lesion identified. Increased echogenicity of hepatic parenchyma is noted with nodular contours consistent with hepatic cirrhosis. Mild intrahepatic biliary dilatation is noted. Portal vein is patent on color Doppler imaging with normal direction of blood flow towards the liver. Other: None. IMPRESSION: Cholelithiasis with mild gallbladder wall thickening and gallbladder distention. If there is clinical concern for cholecystitis, HIDA scan is recommended for further evaluation. Mild intrahepatic and extrahepatic biliary dilatation is noted. Correlation with liver function tests is recommended to rule out distal common bile duct obstruction. Findings consistent with hepatic cirrhosis. No focal sonographic hepatic abnormality is noted. Electronically Signed   By: Marijo Conception  M.D.   On: 03/07/2019 08:23   US Abdomen Limited RUQ  Result Date: 03/05/2019 CLINICAL DATA:  Dilated gallbladder with cholelithiasis on recent CT examination. EXAM: ULTRASOUND ABDOMEN LIMITED RIGHT UPPER QUADRANT COMPARISON:  02/10/2019 FINDINGS: Gallbladder: Gallbladder distension is again noted similar to that seen on the prior exam. Cholelithiasis is identified. Mild wall thickening is noted to 3.8 mm. No pericholecystic fluid is seen. Common bile duct: Diameter: 8.5 mm. Liver: Lobular margins are noted consistent with underlying cirrhosis. These changes are similar to that seen on prior CT examination. No focal mass lesion is noted. Mild intrahepatic biliary ductal dilatation is seen. Portal vein is patent on  color Doppler imaging with normal direction of blood flow towards the liver. Other: None. IMPRESSION: Significant gallbladder distension with evidence of cholelithiasis and findings suggestive of acute cholecystitis. Hepatic cirrhosis. Electronically Signed   By: Inez Catalina M.D.   On: 03/05/2019 08:42    Microbiology No results found for this or any previous visit (from the past 240 hour(s)).  Lab Basic Metabolic Panel: Recent Labs  Lab 03/09/19 1613 03/10/19 0657 03/10/19 1705 03/11/19 0558 03/12/19 0602 03/13/19 0500 03-16-2019 0445  NA 146* 147* 146* 144 146* 143 138  K 2.9* 3.3* 3.4* 3.3* 3.4* 3.6 3.6  CL 114* 112* 115* 114* 114* 110 108  CO2 23 22 19* 20* 20* 22 21*  GLUCOSE 156* 102* 97 140* 152* 91 97  BUN 46* 38* 32* 25* 31* 32* 31*  CREATININE 2.31* 2.55* 2.15* 1.87* 1.95* 2.00* 1.91*  CALCIUM 8.2* 8.2* 8.4* 8.5* 8.5* 8.4* 8.6*  MG  --  2.6*  --  2.4 2.4 2.0 2.1  PHOS 3.0 2.7 3.3 3.8 3.7  --   --    Liver Function Tests: Recent Labs  Lab 03/09/19 0350 03/10/19 1705 03/11/19 0558 03/12/19 0602 03/13/19 0500 2019-03-16 0445  AST 859*  --  290* 156* 96* 96*  ALT 683*  --  450* 318* 225* 196*  ALKPHOS 75  --  78 79 71 72  BILITOT 27.1*  --  35.2* 29.4* 28.2*  31.6*  PROT 6.9  --  7.6 7.4 6.9 6.7  ALBUMIN 2.6* 2.2* 2.4* 2.2* 2.0* 2.0*   No results for input(s): LIPASE, AMYLASE in the last 168 hours. Recent Labs  Lab 03/09/19 1155 03/10/19 0908 03/11/19 0550 03/12/19 0602  AMMONIA 29 23 40* 65*   CBC: Recent Labs  Lab 03/10/19 0607 03/11/19 0558 03/12/19 0602 03/13/19 0500 2019/03/16 0445  WBC 6.7 11.9* 20.7* 14.6* 15.6*  HGB 7.8* 9.2* 9.8* 9.2* 9.4*  HCT 23.4* 27.5* 31.2* 27.6* 28.5*  MCV 95.5 96.5 101.3* 97.9 99.7  PLT 57* 81* 147* 124* 158   Cardiac Enzymes: No results for input(s): CKTOTAL, CKMB, CKMBINDEX, TROPONINI in the last 168 hours. Sepsis Labs: Recent Labs  Lab 03/11/19 0558 03/12/19 0602 03/13/19 0500 March 16, 2019 0445  WBC 11.9* 20.7* 14.6* 15.6*    Procedures/Operations    12/29: Transthoracic Echocardiogram  Findings: The examined esophagus was normal. Diffuse moderately erythematous mucosa was found in the stomach that  could be seen. Estimated blood loss: none. Clotted blood was found in the gastric fundus, in the gastric body and  on the greater curvature of the stomach. Large amount of old blood and  clot prevented a good view and it was so thick it could not be suctioned. The examined duodenum was normal. Impression: - Normal esophagus. - Erythematous mucosa in the stomach. - Clotted blood in the greater curvature of the  stomach, in the gastric fundus and in the gastric  body. - Normal examined duodenum. - No specimens collected. Recommendation: - NPO. - Continue present medications. - Continue supportive care. PPI, Octreotide.  Transfuse as needed. Follow clinically.   Cordelia Poche 03/15/2019, 2:01 PM

## 2019-04-07 NOTE — Progress Notes (Signed)
PCCM Note:  Called by rapid response for a patient in cardiac arrest for 1 hour.  Patient is currently having CPR done.  Was able to get a heart rate but with a SBP on epi remains in the 40's and pulse becoming more thready.  After an hour of CPR I do not believe further resuscitative efforts will provide any different an outcome.  I spoke with the patient's brother and informed him that no further resuscitative efforts will be performed as it is futile in nature after an hour of chest compression.  Pulse was present briefly after discussion with the brother but that stopped and patient was declared dead.  See code sheet for further details.  Stayed bedside with the patient til pulse stopped.   The patient is critically ill with multiple organ systems failure and requires high complexity decision making for assessment and support, frequent evaluation and titration of therapies, application of advanced monitoring technologies and extensive interpretation of multiple databases.   Critical Care Time devoted to patient care services described in this note is  32  Minutes. This time reflects time of care of this signee Dr Jennet Maduro. This critical care time does not reflect procedure time, or teaching time or supervisory time of PA/NP/Med student/Med Resident etc but could involve care discussion time.  Rush Farmer, M.D. [facility] Pulmonary/Critical Care Medicine.

## 2019-04-07 NOTE — Progress Notes (Signed)
Patient's belongings placed in [place] room for family to pick up. Nursing secretary made aware. Money also found in patient's room and was tubed to 2H.

## 2019-04-07 NOTE — Progress Notes (Signed)
OT Cancellation Note  Patient Details Name: Kolston Lacount MRN: [id number] DOB: Mar 30, 1965   Cancelled Treatment:    Reason Eval/Treat Not Completed: Medical issues which prohibited therapy(pt coded and intubated)  Malka So 2019/03/31, 8:20 AM  Nestor Lewandowsky, OTR/L Acute Rehabilitation Services Pager: [phone] Office: [phone]

## 2019-04-07 NOTE — Anesthesia Procedure Notes (Signed)
Procedure Name: Intubation Date/Time: 12-Apr-2019 7:04 AM Performed by: Lavell Luster, CRNA Pre-anesthesia Checklist: Patient identified, Emergency Drugs available, Suction available and Patient being monitored Patient Re-evaluated:Patient Re-evaluated prior to induction Preoxygenation: Pre-oxygenation with 100% oxygen Ventilation: Mask ventilation with difficulty Laryngoscope Size: Mac, Glidescope and 4 Grade View: Grade IV Tube type: Oral Tube size: 7.5 mm Number of attempts: 3 Airway Equipment and Method: Video-laryngoscopy and Stylet Placement Confirmation: ETT inserted through vocal cords under direct vision,  breath sounds checked- equal and bilateral and CO2 detector Secured at: 20 cm Tube secured with: Tape Dental Injury: Teeth and Oropharynx as per pre-operative assessment  Difficulty Due To: Difficulty was anticipated Comments: Called to 3 E room 6 for code in progress, pt pouring blood from mouth, nose, and eyes.  Compressions in progress.  Attempted DL x 1 with glidescope by Vonda Antigua , blood impeding view.  Continued bagging pt.  OGT inserted  , frank red blood noted.  Compression continue.  Attempted DL with videolaryngoscope by myself with of left vocal cord.  Passed ETT with difficulty.  +ETCO2 and auscultated breath sounds.  Pt difficult to bag.  Respiratory attempted to secure ETT to face but tape would not stick due to secretions and blood.  Taped ETT myself with thin pink tape. Team was continuing code and speaking to family when anesthesia left the room.    Henderson Cloud, CRNA

## 2019-04-07 NOTE — Progress Notes (Signed)
Spoke with patients Step-father- Glenn Alvarado on the phone. [name] given patient placement number to call when he knows the details of the funeral home.

## 2019-04-07 NOTE — Code Documentation (Signed)
CODE BLUE NOTE  Patient Name: Glenn Alvarado   MRN: [id number]   Date of Birth/ Sex: 1966-01-24 , male      Admission Date: 02/11/2019  Attending Provider: Mariel Aloe, MD  Primary Diagnosis: Hepatic encephalopathy [facility])    Indication: Pt was in his usual state of health until this 06:48  AM, when he was noted to be PEA. Code blue was subsequently called. At the time of arrival on scene, ACLS protocol was underway.    Technical Description:  - CPR performance duration:  47 minute  - Was defibrillation or cardioversion used? Yes   - Was external pacer placed? No  - Was patient intubated pre/post CPR? Yes    Medications Administered: Y = Yes; Blank = No Amiodarone  y  Atropine  y  Calcium    Epinephrine  y  Lidocaine    Magnesium    Norepinephrine  y  Phenylephrine    Sodium bicarbonate  y  Vasopressin      Post CPR evaluation:  - Final Status - Was patient successfully resuscitated ? Yes - What is current rhythm? Sinus rhythm  - What is current hemodynamic status? Stable, weak pulse   Miscellaneous Information:  - Labs sent, including: no  - Primary team notified?  Yes  - Family Notified? Yes  - Additional notes/ transfer status: Transfer to [place], Glenn Yoakum, MD  03-23-2019, 7:44 AM

## 2019-04-07 NOTE — Progress Notes (Addendum)
Received pt w/ code already in progress, pt was already intubated when I took over. Received ETT at 25 at lip, + [facility] t/o.  ETCO2 25-38 while ambu bagging on 100% fio2.  Pt bagged on 100% fio2 t/o code, and transport to [place].  Once pt transported to [place], pt was briefly placed on vent, vt 76m/kg, rate 20, 5 peep, 100% until MD can eval and write orders.  Pt was on the vent for a few minutes before CCM MD came to bedside and declared death and requested pt be removed from vent.  Vent turned off and ETT remaines  In tact per MD request.  RN at bedside and aware.

## 2019-04-07 DEATH — deceased

## 2019-10-03 IMAGING — RF DG FLUORO GUIDE NDL PLC/BX
1 series · 1 of 1 positions shown · non-contrast
Comparison: none

ADDENDUM:
Please note that aspiration was performed of the left SHOULDER, and
not the left hip.

EXAM:
Left shoulder aspiration under fluoroscopy
PROCEDURE:
Overlying skin prepped with Betadine, draped in the usual sterile
fashion, and infiltrated locally with buffered lidocaine. Curved 22
gauge spinal needle advanced to the superior medial margin of the
left humeral head. 1 mL of lidocaine injected easily. Diagnostic
injection of iodinated contrast demonstrate intra-articular spread
without intravascular component.
Once appropriate positioning was confirmed, aspiration was
performed. 1 cc serosanguineous fluid was aspirated and sent for
laboratory testing. No immediate complications.
CLINICAL DATA: Clinical concern for osteomyelitis of the left
shoulder.

[Series 1: cp_standard · 0.10mm/px · 1 of 1 slices shown]
[im 1/1]
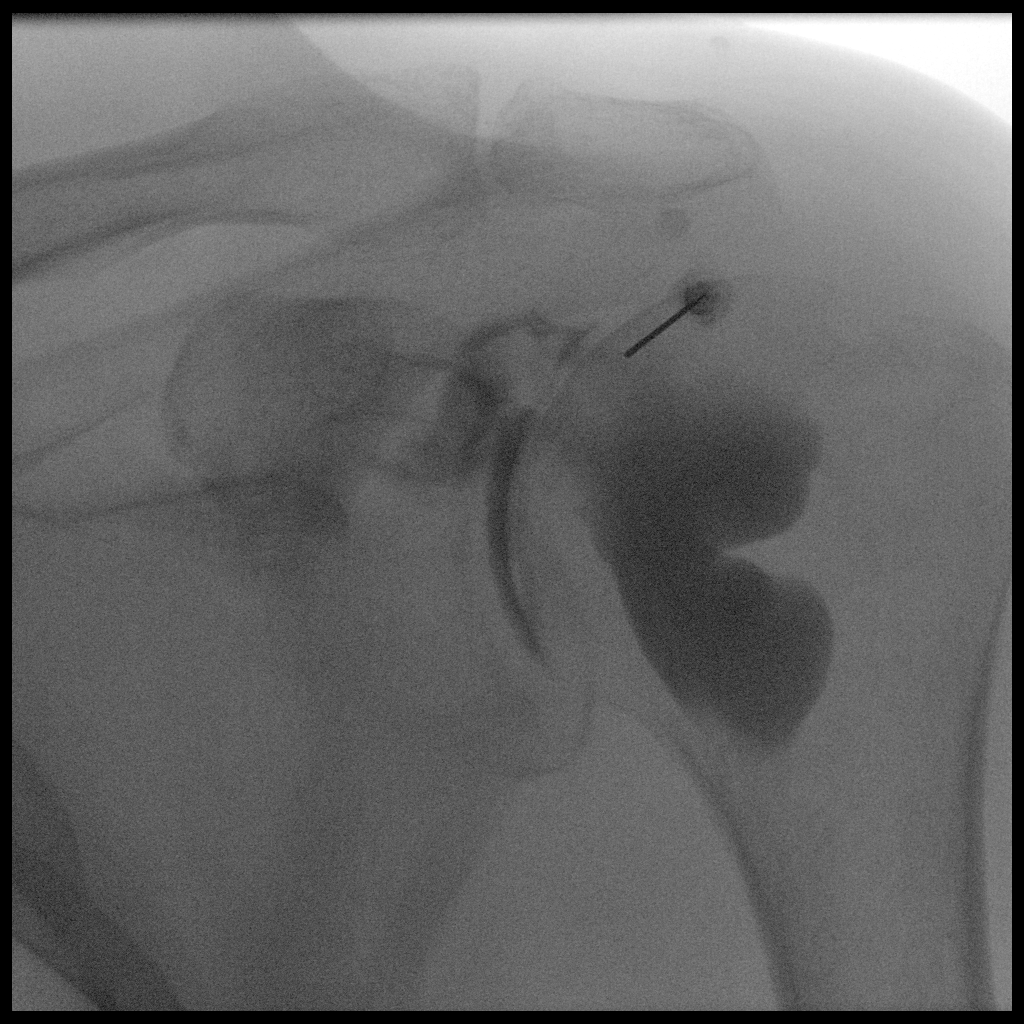

[1 of 1 positions shown; findings below may reference images not displayed]

IMPRESSION: Technically successful left shoulder aspiration under fluoroscopy
guidance.
EXAM:
LEFT HIP INJECTION UNDER FLUOROSCOPY

FLUOROSCOPY TIME:  Fluoroscopy Time:  42 seconds

PROCEDURE:
Overlying skin prepped with Betadine, draped in the usual sterile
fashion, and infiltrated locally with buffered Lidocaine. Curved 22
gauge spinal needle advanced to the superolateral margin of the left
femoral head. 1 ml of Lidocaine injected easily. Diagnostic
injection of iodinated contrast demonstrates intra-articular spread
without intravascular component.

Once appropriate positioning was confirmed, aspiration was
performed. 1 cc serosanguineous fluid was aspirated and sent for
laboratory testing.

No immediate complication.
IMPRESSION: Technically successful left hip aspiration under fluoroscopy.

## 2021-05-07 IMAGING — CT CT ABD-PELV W/O CM
2 of 4 series · 16 of 46 positions shown, 18 images · non-contrast
Comparison: CT of the abdomen pelvis dated 07/24/2018.

CLINICAL DATA: 53-year-old male with abdominal pain and vomiting
and jaundice. History of cirrhosis.

EXAM:
CT ABDOMEN AND PELVIS WITHOUT CONTRAST
TECHNIQUE: Multidetector CT imaging of the abdomen and pelvis was performed
following the standard protocol without IV contrast.

[Series 3: a/p w/o 5mm · axial · non-contrast · 0.74mm/px · z∈[+646,+1046]mm · 13 of 88 slices shown, 15 images]
[im 4/88  soft-tissue]
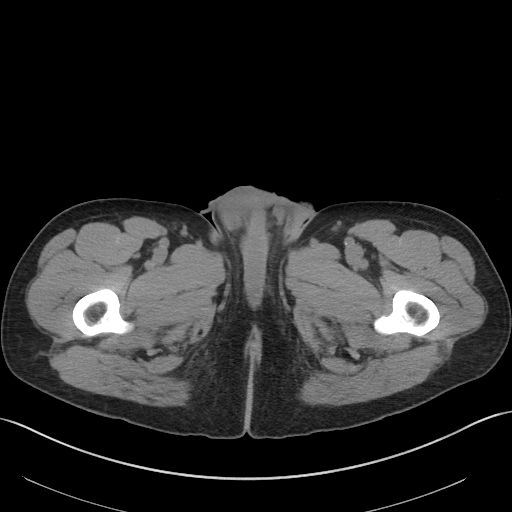
[im 4/88  bone]
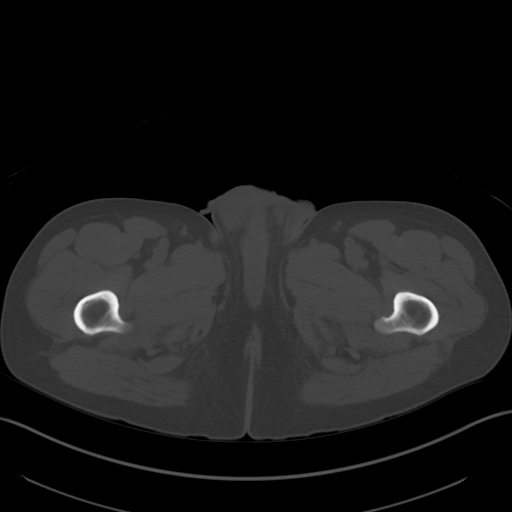
[im 11/88  soft-tissue]
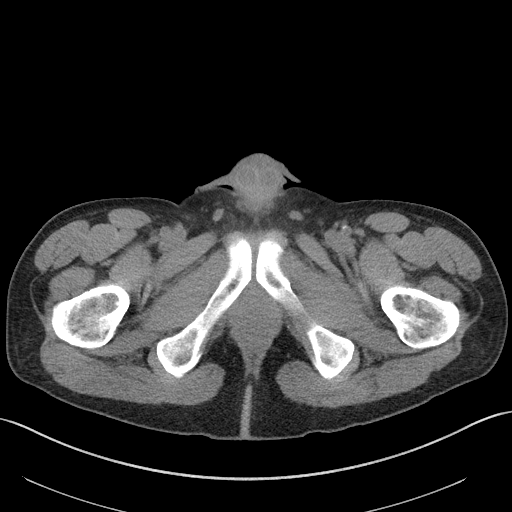
[im 18/88  soft-tissue]
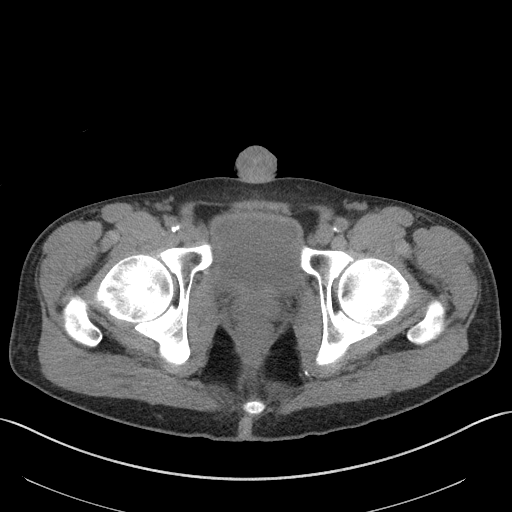
[im 25/88  soft-tissue]
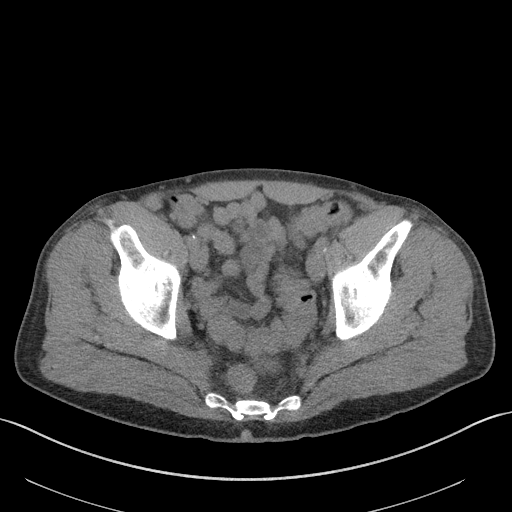
[im 32/88  soft-tissue]
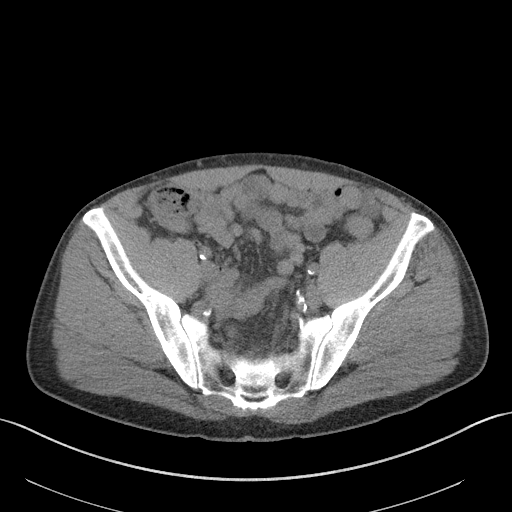
[im 39/88  soft-tissue]
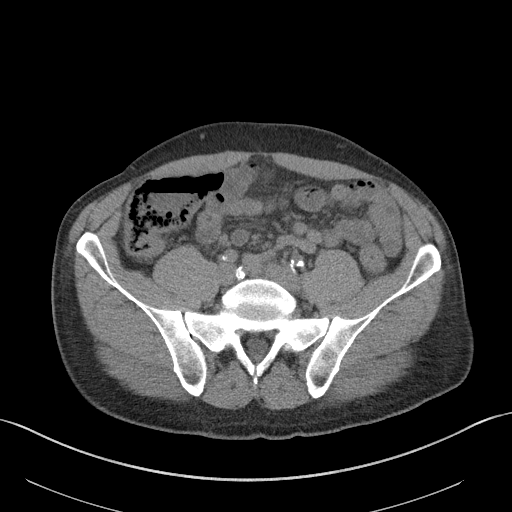
[im 46/88  soft-tissue]
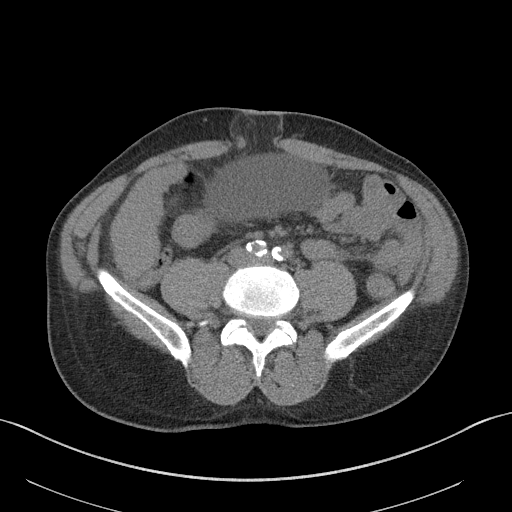
[im 49/88  soft-tissue]
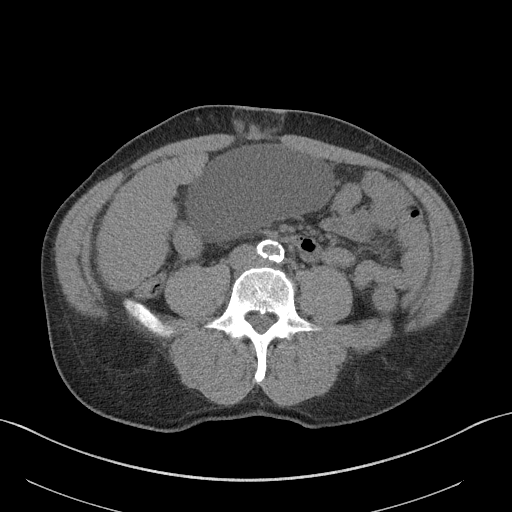
[im 56/88  soft-tissue]
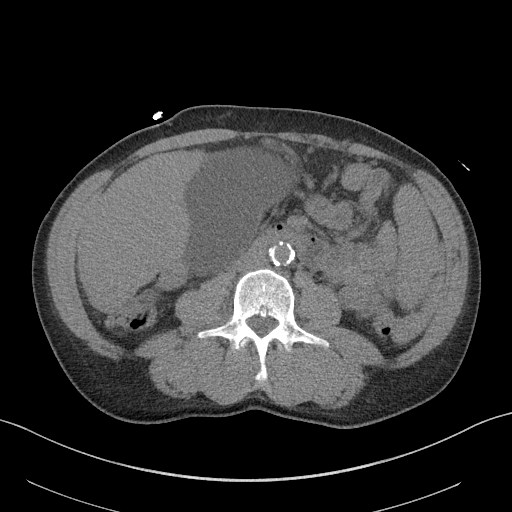
[im 56/88  bone]
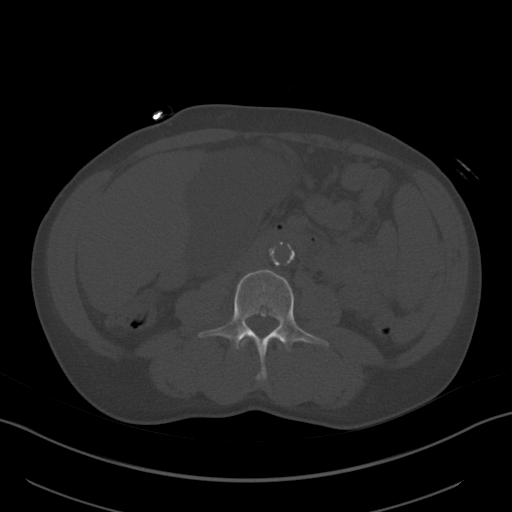
[im 63/88  soft-tissue]
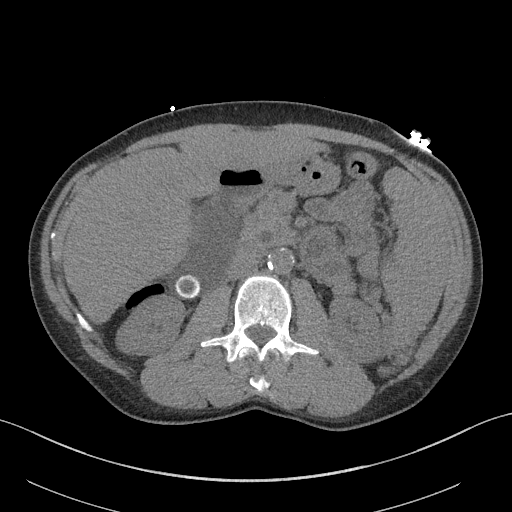
[im 70/88  soft-tissue]
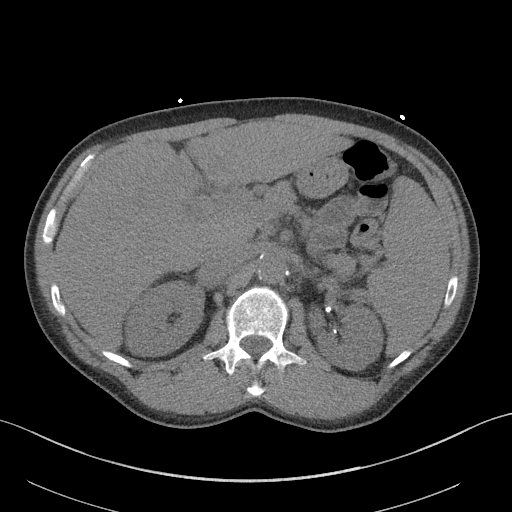
[im 77/88  soft-tissue]
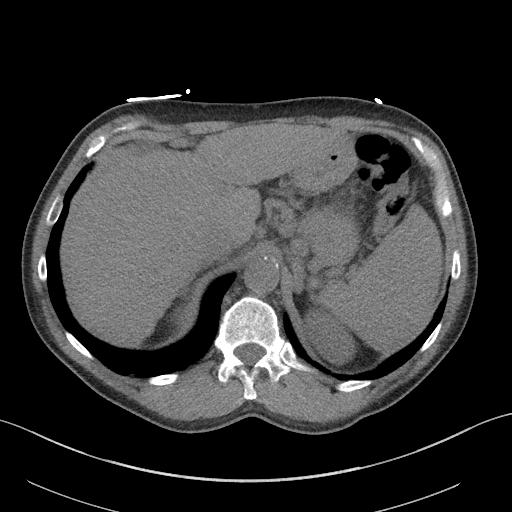
[im 84/88  soft-tissue]
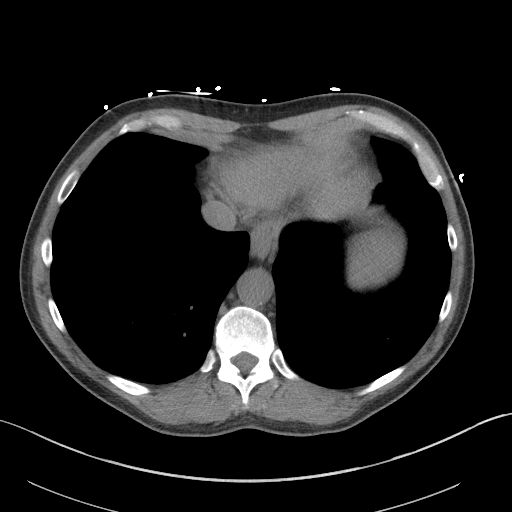

[Series 6: a/p w/o cor · coronal · non-contrast · 0.77mm/px · 3 of 150 slices shown]
[im 50/150  soft-tissue]
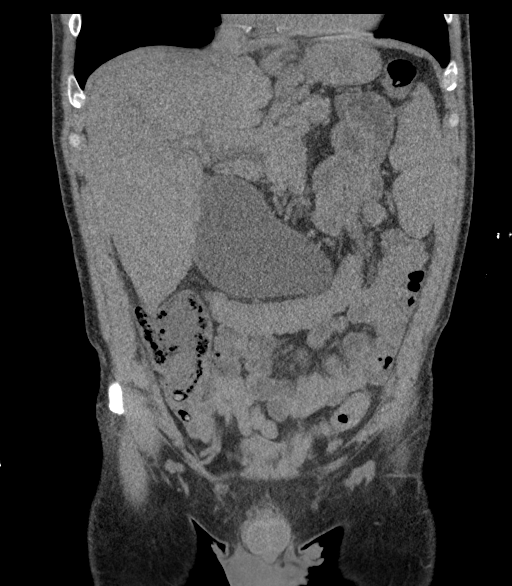
[im 67/150  soft-tissue]
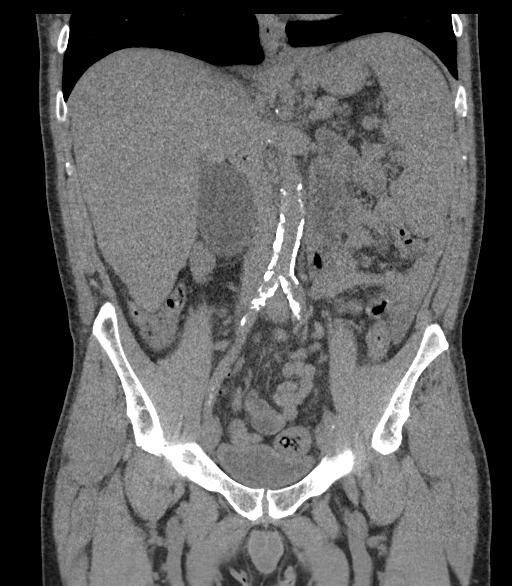
[im 83/150  soft-tissue]
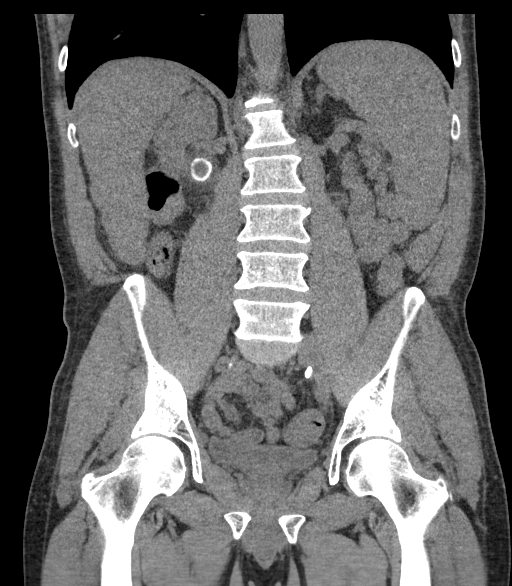

[16 of 46 positions shown; findings below may reference images not displayed]

FINDINGS: Evaluation of this exam is limited in the absence of intravenous
contrast.

Lower chest: Mild emphysema. The visualized lung bases are clear.

No intra-abdominal free air or free fluid.

Hepatobiliary: Cirrhosis. The gallbladder is distended. There is a 2
cm rim calcified stone in the neck of the gallbladder. No
pericholecystic fluid.

Pancreas: The pancreas is unremarkable as visualized.

Spleen: Splenomegaly measuring 15 cm in craniocaudal length
(previously 14 cm).

Adrenals/Urinary Tract: The adrenal glands are unremarkable.
Vascular calcification versus less likely nonobstructing bilateral
renal calculi measuring up to 5 mm in the left kidney. There is no
hydronephrosis on either side. The visualized ureters and urinary
bladder appear unremarkable.

Stomach/Bowel: There is no bowel obstruction or active inflammation.
The appendix is normal.

Vascular/Lymphatic: There is advanced aortoiliac atherosclerotic
disease. No portal venous gas. Upper abdominal varices noted.

Reproductive: The prostate and seminal vesicles are grossly
unremarkable. No pelvic mass.

Other: There is a fat containing umbilical hernia similar to prior
CT. No fluid collection.

Musculoskeletal: No acute or significant osseous findings.
IMPRESSION: 1. Cirrhosis with evidence of portal hypertension including
splenomegaly and upper abdominal varices.
2. Cholelithiasis. Distended appearance of the gallbladder similar
to prior CT. No intrahepatic biliary ductal dilatation or interval
change in the diameter of the CBD since the prior CT.
3. No bowel obstruction or active inflammation. Normal appendix.
4. Aortic Atherosclerosis ([id number]).
